# Patient Record
Sex: Female | Born: 1940
Health system: Southern US, Community
[De-identification: ages and names within clinical notes are randomized; demographics above are authoritative.]

## PROBLEM LIST (undated history)

## (undated) DIAGNOSIS — R55 Syncope and collapse: Secondary | ICD-10-CM

## (undated) DIAGNOSIS — K219 Gastro-esophageal reflux disease without esophagitis: Secondary | ICD-10-CM

## (undated) DIAGNOSIS — N189 Chronic kidney disease, unspecified: Secondary | ICD-10-CM

## (undated) DIAGNOSIS — M545 Low back pain, unspecified: Secondary | ICD-10-CM

## (undated) DIAGNOSIS — G8929 Other chronic pain: Secondary | ICD-10-CM

## (undated) DIAGNOSIS — E785 Hyperlipidemia, unspecified: Secondary | ICD-10-CM

## (undated) DIAGNOSIS — Z720 Tobacco use: Secondary | ICD-10-CM

## (undated) DIAGNOSIS — F0390 Unspecified dementia without behavioral disturbance: Secondary | ICD-10-CM

## (undated) DIAGNOSIS — J449 Chronic obstructive pulmonary disease, unspecified: Secondary | ICD-10-CM

## (undated) DIAGNOSIS — G44009 Cluster headache syndrome, unspecified, not intractable: Secondary | ICD-10-CM

## (undated) DIAGNOSIS — I739 Peripheral vascular disease, unspecified: Secondary | ICD-10-CM

## (undated) HISTORY — DX: Cluster headache syndrome, unspecified, not intractable: G44.009

## (undated) HISTORY — DX: Chronic obstructive pulmonary disease, unspecified: J44.9

## (undated) HISTORY — DX: Tobacco use: Z72.0

## (undated) HISTORY — DX: Other chronic pain: G89.29

## (undated) HISTORY — DX: Gastro-esophageal reflux disease without esophagitis: K21.9

## (undated) HISTORY — DX: Low back pain, unspecified: M54.50

## (undated) HISTORY — DX: Low back pain: M54.5

## (undated) HISTORY — DX: Peripheral vascular disease, unspecified: I73.9

## (undated) HISTORY — DX: Chronic kidney disease, unspecified: N18.9

## (undated) HISTORY — DX: Syncope and collapse: R55

## (undated) HISTORY — DX: Hyperlipidemia, unspecified: E78.5

---

## 1998-09-08 ENCOUNTER — Ambulatory Visit (HOSPITAL_COMMUNITY): Admission: RE | Admit: 1998-09-08 | Discharge: 1998-09-08 | Payer: Self-pay | Admitting: *Deleted

## 1998-09-14 ENCOUNTER — Ambulatory Visit (HOSPITAL_COMMUNITY): Admission: RE | Admit: 1998-09-14 | Discharge: 1998-09-14 | Payer: Self-pay | Admitting: *Deleted

## 1998-09-14 ENCOUNTER — Encounter: Payer: Self-pay | Admitting: *Deleted

## 2002-04-15 ENCOUNTER — Encounter: Admission: RE | Admit: 2002-04-15 | Discharge: 2002-04-15 | Payer: Self-pay | Admitting: Internal Medicine

## 2002-04-15 ENCOUNTER — Encounter: Payer: Self-pay | Admitting: Internal Medicine

## 2002-09-04 ENCOUNTER — Other Ambulatory Visit: Admission: RE | Admit: 2002-09-04 | Discharge: 2002-09-04 | Payer: Self-pay | Admitting: Internal Medicine

## 2006-07-30 ENCOUNTER — Other Ambulatory Visit: Admission: RE | Admit: 2006-07-30 | Discharge: 2006-07-30 | Payer: Self-pay | Admitting: Internal Medicine

## 2006-09-12 ENCOUNTER — Encounter (INDEPENDENT_AMBULATORY_CARE_PROVIDER_SITE_OTHER): Payer: Self-pay | Admitting: Specialist

## 2006-09-12 ENCOUNTER — Ambulatory Visit (HOSPITAL_COMMUNITY): Admission: RE | Admit: 2006-09-12 | Discharge: 2006-09-12 | Payer: Self-pay | Admitting: *Deleted

## 2009-02-25 ENCOUNTER — Other Ambulatory Visit: Admission: RE | Admit: 2009-02-25 | Discharge: 2009-02-25 | Payer: Self-pay | Admitting: Internal Medicine

## 2010-10-21 NOTE — Op Note (Signed)
NAMEMarland Kitchen  KATHRYN, COSBY NO.:  [id number]   MEDICAL RECORD NO.:  [id number]          PATIENT TYPE:  AMB   LOCATION:  ENDO                         FACILITY:  [facility]   PHYSICIAN:  Georgiana Spinner, M.D.    DATE OF BIRTH:  08-02-1940   DATE OF PROCEDURE:  09/12/2006  DATE OF DISCHARGE:                               OPERATIVE REPORT   PROCEDURE:  Colonoscopy.   ENDOSCOPIST:  Georgiana Spinner, M.D.   INDICATIONS:  Colon polyp, colon cancer screening.   ANESTHESIA:  Demerol 70 mg, Versed 7.5 mg.   PROCEDURE:  With the patient mildly sedated in the left lateral  decubitus position, the Pentax videoscopic colonoscope was inserted into  the rectum and passed under direct vision to the cecum, identified by  the ileocecal valve and appendiceal orifice, both which were  photographed.  From this point, the colonoscope was slowly withdrawn,  taking circumferential views of the colonic mucosa, stopping at  approximately 20 cm from the anal verge, at which point 2 polyps were  encountered; there were photographed and removed using hot-biopsy-  forceps technique at a setting of 20/200 blended current.  We then  withdrew all the way to the rectum, which appeared normal on direct and  showed 3 small polyps on retroflexed view, which were also removed using  hot-biopsy-forceps technique in the same setting, this done in  retroflexed view.  The endoscope was straightened and withdrawn.  The  patient's vital signs and pulse oximetry remained stable.  The patient  tolerated the procedure well without apparent complications.   FINDINGS:  Polyps of rectum and at 20 cm from anal verge.  Internal  hemorrhoids were also noted.  Otherwise, an unremarkable exam.   PLAN:  Await biopsy report.  The patient will call me for results and  follow up with me as an outpatient.           ______________________________  Georgiana Spinner, M.D.     GMO/MEDQ  D:  09/12/2006  T:  09/12/2006  Job:  [id number]

## 2014-11-12 DIAGNOSIS — Z1231 Encounter for screening mammogram for malignant neoplasm of breast: Secondary | ICD-10-CM | POA: Diagnosis not present

## 2014-11-19 DIAGNOSIS — F17210 Nicotine dependence, cigarettes, uncomplicated: Secondary | ICD-10-CM | POA: Diagnosis not present

## 2014-11-19 DIAGNOSIS — Z1389 Encounter for screening for other disorder: Secondary | ICD-10-CM | POA: Diagnosis not present

## 2014-11-19 DIAGNOSIS — M810 Age-related osteoporosis without current pathological fracture: Secondary | ICD-10-CM | POA: Diagnosis not present

## 2014-11-19 DIAGNOSIS — N390 Urinary tract infection, site not specified: Secondary | ICD-10-CM | POA: Diagnosis not present

## 2014-11-19 DIAGNOSIS — Z0000 Encounter for general adult medical examination without abnormal findings: Secondary | ICD-10-CM | POA: Diagnosis not present

## 2014-11-19 DIAGNOSIS — E785 Hyperlipidemia, unspecified: Secondary | ICD-10-CM | POA: Diagnosis not present

## 2014-11-26 DIAGNOSIS — F17210 Nicotine dependence, cigarettes, uncomplicated: Secondary | ICD-10-CM | POA: Diagnosis not present

## 2014-11-26 DIAGNOSIS — M545 Low back pain: Secondary | ICD-10-CM | POA: Diagnosis not present

## 2014-11-26 DIAGNOSIS — K219 Gastro-esophageal reflux disease without esophagitis: Secondary | ICD-10-CM | POA: Diagnosis not present

## 2014-11-26 DIAGNOSIS — G44009 Cluster headache syndrome, unspecified, not intractable: Secondary | ICD-10-CM | POA: Diagnosis not present

## 2014-11-26 DIAGNOSIS — E785 Hyperlipidemia, unspecified: Secondary | ICD-10-CM | POA: Diagnosis not present

## 2014-11-26 DIAGNOSIS — J449 Chronic obstructive pulmonary disease, unspecified: Secondary | ICD-10-CM | POA: Diagnosis not present

## 2014-11-26 DIAGNOSIS — N183 Chronic kidney disease, stage 3 (moderate): Secondary | ICD-10-CM | POA: Diagnosis not present

## 2014-11-26 DIAGNOSIS — M810 Age-related osteoporosis without current pathological fracture: Secondary | ICD-10-CM | POA: Diagnosis not present

## 2015-06-29 DIAGNOSIS — M810 Age-related osteoporosis without current pathological fracture: Secondary | ICD-10-CM | POA: Diagnosis not present

## 2015-06-29 DIAGNOSIS — E785 Hyperlipidemia, unspecified: Secondary | ICD-10-CM | POA: Diagnosis not present

## 2015-06-29 DIAGNOSIS — K219 Gastro-esophageal reflux disease without esophagitis: Secondary | ICD-10-CM | POA: Diagnosis not present

## 2015-06-29 DIAGNOSIS — E559 Vitamin D deficiency, unspecified: Secondary | ICD-10-CM | POA: Diagnosis not present

## 2015-07-06 DIAGNOSIS — F17210 Nicotine dependence, cigarettes, uncomplicated: Secondary | ICD-10-CM | POA: Diagnosis not present

## 2015-07-06 DIAGNOSIS — M810 Age-related osteoporosis without current pathological fracture: Secondary | ICD-10-CM | POA: Diagnosis not present

## 2015-07-06 DIAGNOSIS — E785 Hyperlipidemia, unspecified: Secondary | ICD-10-CM | POA: Diagnosis not present

## 2015-07-06 DIAGNOSIS — Z23 Encounter for immunization: Secondary | ICD-10-CM | POA: Diagnosis not present

## 2015-07-06 DIAGNOSIS — N183 Chronic kidney disease, stage 3 (moderate): Secondary | ICD-10-CM | POA: Diagnosis not present

## 2015-07-06 DIAGNOSIS — J449 Chronic obstructive pulmonary disease, unspecified: Secondary | ICD-10-CM | POA: Diagnosis not present

## 2015-11-17 DIAGNOSIS — H524 Presbyopia: Secondary | ICD-10-CM | POA: Diagnosis not present

## 2015-11-17 DIAGNOSIS — H5210 Myopia, unspecified eye: Secondary | ICD-10-CM | POA: Diagnosis not present

## 2015-12-21 DIAGNOSIS — E559 Vitamin D deficiency, unspecified: Secondary | ICD-10-CM | POA: Diagnosis not present

## 2015-12-21 DIAGNOSIS — Z1389 Encounter for screening for other disorder: Secondary | ICD-10-CM | POA: Diagnosis not present

## 2015-12-21 DIAGNOSIS — Z23 Encounter for immunization: Secondary | ICD-10-CM | POA: Diagnosis not present

## 2015-12-21 DIAGNOSIS — E785 Hyperlipidemia, unspecified: Secondary | ICD-10-CM | POA: Diagnosis not present

## 2015-12-21 DIAGNOSIS — M810 Age-related osteoporosis without current pathological fracture: Secondary | ICD-10-CM | POA: Diagnosis not present

## 2015-12-21 DIAGNOSIS — Z0000 Encounter for general adult medical examination without abnormal findings: Secondary | ICD-10-CM | POA: Diagnosis not present

## 2015-12-21 DIAGNOSIS — Z0001 Encounter for general adult medical examination with abnormal findings: Secondary | ICD-10-CM | POA: Diagnosis not present

## 2015-12-28 DIAGNOSIS — N183 Chronic kidney disease, stage 3 (moderate): Secondary | ICD-10-CM | POA: Diagnosis not present

## 2015-12-28 DIAGNOSIS — E785 Hyperlipidemia, unspecified: Secondary | ICD-10-CM | POA: Diagnosis not present

## 2015-12-28 DIAGNOSIS — J449 Chronic obstructive pulmonary disease, unspecified: Secondary | ICD-10-CM | POA: Diagnosis not present

## 2015-12-28 DIAGNOSIS — I499 Cardiac arrhythmia, unspecified: Secondary | ICD-10-CM | POA: Diagnosis not present

## 2015-12-28 DIAGNOSIS — K921 Melena: Secondary | ICD-10-CM | POA: Diagnosis not present

## 2015-12-28 DIAGNOSIS — M810 Age-related osteoporosis without current pathological fracture: Secondary | ICD-10-CM | POA: Diagnosis not present

## 2015-12-28 DIAGNOSIS — F17210 Nicotine dependence, cigarettes, uncomplicated: Secondary | ICD-10-CM | POA: Diagnosis not present

## 2016-01-12 DIAGNOSIS — K921 Melena: Secondary | ICD-10-CM | POA: Diagnosis not present

## 2016-01-12 DIAGNOSIS — E782 Mixed hyperlipidemia: Secondary | ICD-10-CM | POA: Diagnosis not present

## 2016-01-12 DIAGNOSIS — R5383 Other fatigue: Secondary | ICD-10-CM | POA: Diagnosis not present

## 2016-01-13 DIAGNOSIS — K921 Melena: Secondary | ICD-10-CM | POA: Diagnosis not present

## 2016-01-19 DIAGNOSIS — M810 Age-related osteoporosis without current pathological fracture: Secondary | ICD-10-CM | POA: Diagnosis not present

## 2016-01-21 DIAGNOSIS — Z1231 Encounter for screening mammogram for malignant neoplasm of breast: Secondary | ICD-10-CM | POA: Diagnosis not present

## 2016-02-03 DIAGNOSIS — K3189 Other diseases of stomach and duodenum: Secondary | ICD-10-CM | POA: Diagnosis not present

## 2016-02-03 DIAGNOSIS — D125 Benign neoplasm of sigmoid colon: Secondary | ICD-10-CM | POA: Diagnosis not present

## 2016-02-03 DIAGNOSIS — K319 Disease of stomach and duodenum, unspecified: Secondary | ICD-10-CM | POA: Diagnosis not present

## 2016-02-03 DIAGNOSIS — R195 Other fecal abnormalities: Secondary | ICD-10-CM | POA: Diagnosis not present

## 2016-02-03 DIAGNOSIS — K921 Melena: Secondary | ICD-10-CM | POA: Diagnosis not present

## 2016-02-03 DIAGNOSIS — D123 Benign neoplasm of transverse colon: Secondary | ICD-10-CM | POA: Diagnosis not present

## 2016-02-03 DIAGNOSIS — K5730 Diverticulosis of large intestine without perforation or abscess without bleeding: Secondary | ICD-10-CM | POA: Diagnosis not present

## 2016-02-03 DIAGNOSIS — D120 Benign neoplasm of cecum: Secondary | ICD-10-CM | POA: Diagnosis not present

## 2016-02-03 DIAGNOSIS — K2970 Gastritis, unspecified, without bleeding: Secondary | ICD-10-CM | POA: Diagnosis not present

## 2016-02-03 DIAGNOSIS — K635 Polyp of colon: Secondary | ICD-10-CM | POA: Diagnosis not present

## 2016-02-08 ENCOUNTER — Other Ambulatory Visit: Payer: Self-pay | Admitting: Internal Medicine

## 2016-02-08 DIAGNOSIS — Z9181 History of falling: Secondary | ICD-10-CM | POA: Diagnosis not present

## 2016-02-08 DIAGNOSIS — R55 Syncope and collapse: Secondary | ICD-10-CM

## 2016-02-08 DIAGNOSIS — N390 Urinary tract infection, site not specified: Secondary | ICD-10-CM | POA: Diagnosis not present

## 2016-02-08 DIAGNOSIS — S069X9A Unspecified intracranial injury with loss of consciousness of unspecified duration, initial encounter: Secondary | ICD-10-CM | POA: Diagnosis not present

## 2016-02-08 DIAGNOSIS — Z23 Encounter for immunization: Secondary | ICD-10-CM | POA: Diagnosis not present

## 2016-02-09 ENCOUNTER — Telehealth: Payer: Self-pay | Admitting: Cardiovascular Disease

## 2016-02-09 NOTE — Telephone Encounter (Signed)
Received records from [facility] for appointment with Dr Gwenlyn Found on 02/16/16.  Records given to [facility] (medical records) for Dr Kennon Holter schedule on 02/16/16. lp

## 2016-02-16 ENCOUNTER — Ambulatory Visit
Admission: RE | Admit: 2016-02-16 | Discharge: 2016-02-16 | Disposition: A | Discharge status: Home / Self Care | Payer: Commercial Managed Care - HMO | Source: Ambulatory Visit | Attending: Internal Medicine | Admitting: Internal Medicine

## 2016-02-16 ENCOUNTER — Ambulatory Visit: Payer: Commercial Managed Care - HMO | Admitting: Cardiovascular Disease

## 2016-02-16 DIAGNOSIS — R55 Syncope and collapse: Secondary | ICD-10-CM

## 2016-02-16 DIAGNOSIS — S069X9A Unspecified intracranial injury with loss of consciousness of unspecified duration, initial encounter: Secondary | ICD-10-CM

## 2016-02-22 DIAGNOSIS — S3992XA Unspecified injury of lower back, initial encounter: Secondary | ICD-10-CM | POA: Diagnosis not present

## 2016-02-22 DIAGNOSIS — M546 Pain in thoracic spine: Secondary | ICD-10-CM | POA: Diagnosis not present

## 2016-02-22 DIAGNOSIS — M545 Low back pain: Secondary | ICD-10-CM | POA: Diagnosis not present

## 2016-02-22 DIAGNOSIS — S199XXA Unspecified injury of neck, initial encounter: Secondary | ICD-10-CM | POA: Diagnosis not present

## 2016-02-22 DIAGNOSIS — Z9181 History of falling: Secondary | ICD-10-CM | POA: Diagnosis not present

## 2016-02-22 DIAGNOSIS — S299XXA Unspecified injury of thorax, initial encounter: Secondary | ICD-10-CM | POA: Diagnosis not present

## 2016-02-22 DIAGNOSIS — M898X1 Other specified disorders of bone, shoulder: Secondary | ICD-10-CM | POA: Diagnosis not present

## 2016-02-22 DIAGNOSIS — M542 Cervicalgia: Secondary | ICD-10-CM | POA: Diagnosis not present

## 2016-02-23 DIAGNOSIS — H2511 Age-related nuclear cataract, right eye: Secondary | ICD-10-CM | POA: Diagnosis not present

## 2016-02-23 DIAGNOSIS — H2513 Age-related nuclear cataract, bilateral: Secondary | ICD-10-CM | POA: Diagnosis not present

## 2016-03-15 ENCOUNTER — Encounter: Payer: Self-pay | Admitting: Cardiovascular Disease

## 2016-03-15 ENCOUNTER — Ambulatory Visit (INDEPENDENT_AMBULATORY_CARE_PROVIDER_SITE_OTHER): Payer: Commercial Managed Care - HMO | Admitting: Cardiovascular Disease

## 2016-03-15 VITALS — BP 106/68 | HR 63 | Ht 60.0 in | Wt 103.0 lb

## 2016-03-15 DIAGNOSIS — R55 Syncope and collapse: Secondary | ICD-10-CM | POA: Insufficient documentation

## 2016-03-15 DIAGNOSIS — E7800 Pure hypercholesterolemia, unspecified: Secondary | ICD-10-CM

## 2016-03-15 DIAGNOSIS — Z720 Tobacco use: Secondary | ICD-10-CM | POA: Diagnosis not present

## 2016-03-15 DIAGNOSIS — E785 Hyperlipidemia, unspecified: Secondary | ICD-10-CM | POA: Insufficient documentation

## 2016-03-15 NOTE — Patient Instructions (Signed)
Medication Instructions:  NO CHANGES.   Follow-Up: Your physician recommends that you schedule a follow-up appointment in: AS NEEDED.

## 2016-03-15 NOTE — Assessment & Plan Note (Signed)
Ms . Olivia Phillips had one episode of syncope on 02/05/16 a day after she had colonoscopy which involved bowel prep with lack of eating. It was presumed that this was related to dehydration. She was seen by paramedics and regained consciousness without loss of bowel or bladder bladder control. She had no prior episodes or subsequent episodes. I think this is easily explainable by dehydrated. I do not think she needs further workup at this time.

## 2016-03-15 NOTE — Assessment & Plan Note (Signed)
History of tobacco abuse smoking one pack a day for last 60 years recalcitrant risk factor modification

## 2016-03-15 NOTE — Progress Notes (Signed)
03/15/2016 Olivia Phillips   03/21/1941  [id number]  Primary Physician Thressa Sheller, MD Primary Cardiologist: Lorretta Harp MD Renae Gloss  HPI:  [name] is a 75 year old thin-appearing widowed Caucasian female mother 8, grandmother of 3 grandchildren who was referred by Dr. Noah Delaine for cardiovascular evaluation because of an episode of recent syncope. Resected for coronary artery disease include 60-pack-years of tobacco abuse currently smoking one pack per day and recalcitrant as factor modification. She does have treated hyperlipidemia. One of her siblings, a brother, at age 93 had a stent. She has never had a heart attack or stroke. She denies chest pain or shortness of breath. She had a colonoscopy on 02/04/16 and had an episode of syncope the following day presumably related to dehydration. She has never had an episode prior to that or subsequent to that.   Current Outpatient Prescriptions  Medication Sig Dispense Refill  . atorvastatin (LIPITOR) 40 MG tablet Take 40 mg by mouth daily.    . Calcium Carbonate-Vit D-Min (CALCIUM 1200 PO) Take 1 tablet by mouth daily.    . cholecalciferol (VITAMIN D) 1000 units tablet Take 1,000 Units by mouth daily.    . Multiple Vitamin (MULTIVITAMIN WITH MINERALS) TABS tablet Take 1 tablet by mouth daily.    . pravastatin (PRAVACHOL) 40 MG tablet Take 40 mg by mouth daily.     No current facility-administered medications for this visit.     Allergies  Allergen Reactions  . Codeine   . Alendronate Sodium Nausea And Vomiting  . Ibuprofen Other (See Comments)    Muscle spasms     Social History   Social History  . Marital status: Married    Spouse name: N/A  . Number of children: N/A  . Years of education: N/A   Occupational History  . Not on file.   Social History Main Topics  . Smoking status: Not on file  . Smokeless tobacco: Not on file  . Alcohol use Not on file  . Drug use: Unknown  . Sexual activity:  Not on file   Other Topics Concern  . Not on file   Social History Narrative  . No narrative on file     Review of Systems: General: negative for chills, fever, night sweats or weight changes.  Cardiovascular: negative for chest pain, dyspnea on exertion, edema, orthopnea, palpitations, paroxysmal nocturnal dyspnea or shortness of breath Dermatological: negative for rash Respiratory: negative for cough or wheezing Urologic: negative for hematuria Abdominal: negative for nausea, vomiting, diarrhea, bright red blood per rectum, melena, or hematemesis Neurologic: negative for visual changes, syncope, or dizziness All other systems reviewed and are otherwise negative except as noted above.    Blood pressure 106/68, pulse 63, height 5' (1.524 m), weight 103 lb (46.7 kg).  General appearance: alert and no distress Neck: no adenopathy, no carotid bruit, no JVD, supple, symmetrical, trachea midline and thyroid not enlarged, symmetric, no tenderness/mass/nodules Lungs: clear to auscultation bilaterally Heart: regular rate and rhythm, S1, S2 normal, no murmur, click, rub or gallop Extremities: extremities normal, atraumatic, no cyanosis or edema  EKG normal sinus rhythm at 63 without ST or T-wave changes. I personally reviewed this EKG  ASSESSMENT AND PLAN:   Hyperlipidemia History of hyperlipidemia on statin therapy followed by her PCP  Tobacco abuse History of tobacco abuse smoking one pack a day for last 60 years recalcitrant risk factor modification  Syncope [name] had one episode of syncope on 02/05/16 a  day after she had colonoscopy which involved bowel prep with lack of eating. It was presumed that this was related to dehydration. She was seen by paramedics and regained consciousness without loss of bowel or bladder bladder control. She had no prior episodes or subsequent episodes. I think this is easily explainable by dehydrated. I do not think she needs further workup at this  time.      Lorretta Harp MD FACP,FACC,FAHA, [facility] 03/15/2016 9:46 AM

## 2016-03-15 NOTE — Assessment & Plan Note (Signed)
History of hyperlipidemia on statin therapy followed by her PCP. 

## 2016-04-18 DIAGNOSIS — H2513 Age-related nuclear cataract, bilateral: Secondary | ICD-10-CM | POA: Diagnosis not present

## 2016-04-18 DIAGNOSIS — H2511 Age-related nuclear cataract, right eye: Secondary | ICD-10-CM | POA: Diagnosis not present

## 2016-04-25 DIAGNOSIS — H2512 Age-related nuclear cataract, left eye: Secondary | ICD-10-CM | POA: Diagnosis not present

## 2016-04-25 DIAGNOSIS — H2513 Age-related nuclear cataract, bilateral: Secondary | ICD-10-CM | POA: Diagnosis not present

## 2016-06-27 DIAGNOSIS — E559 Vitamin D deficiency, unspecified: Secondary | ICD-10-CM | POA: Diagnosis not present

## 2016-06-27 DIAGNOSIS — M810 Age-related osteoporosis without current pathological fracture: Secondary | ICD-10-CM | POA: Diagnosis not present

## 2016-06-27 DIAGNOSIS — E785 Hyperlipidemia, unspecified: Secondary | ICD-10-CM | POA: Diagnosis not present

## 2016-06-29 DIAGNOSIS — N183 Chronic kidney disease, stage 3 (moderate): Secondary | ICD-10-CM | POA: Diagnosis not present

## 2016-06-29 DIAGNOSIS — J449 Chronic obstructive pulmonary disease, unspecified: Secondary | ICD-10-CM | POA: Diagnosis not present

## 2016-06-29 DIAGNOSIS — E785 Hyperlipidemia, unspecified: Secondary | ICD-10-CM | POA: Diagnosis not present

## 2016-06-29 DIAGNOSIS — M810 Age-related osteoporosis without current pathological fracture: Secondary | ICD-10-CM | POA: Diagnosis not present

## 2016-09-11 DIAGNOSIS — R413 Other amnesia: Secondary | ICD-10-CM | POA: Diagnosis not present

## 2016-09-28 ENCOUNTER — Telehealth: Payer: Self-pay

## 2016-09-28 ENCOUNTER — Ambulatory Visit: Payer: Medicare HMO | Admitting: Neurology

## 2016-09-28 NOTE — Telephone Encounter (Signed)
Patient did not show to NP appt.

## 2016-10-03 ENCOUNTER — Encounter: Payer: Self-pay | Admitting: Neurology

## 2016-11-13 DIAGNOSIS — Z961 Presence of intraocular lens: Secondary | ICD-10-CM | POA: Diagnosis not present

## 2016-11-13 DIAGNOSIS — H26491 Other secondary cataract, right eye: Secondary | ICD-10-CM | POA: Diagnosis not present

## 2016-11-13 DIAGNOSIS — H26493 Other secondary cataract, bilateral: Secondary | ICD-10-CM | POA: Diagnosis not present

## 2016-11-20 DIAGNOSIS — H524 Presbyopia: Secondary | ICD-10-CM | POA: Diagnosis not present

## 2016-11-20 DIAGNOSIS — H26491 Other secondary cataract, right eye: Secondary | ICD-10-CM | POA: Diagnosis not present

## 2016-11-23 DIAGNOSIS — H524 Presbyopia: Secondary | ICD-10-CM | POA: Diagnosis not present

## 2016-12-13 DIAGNOSIS — Z961 Presence of intraocular lens: Secondary | ICD-10-CM | POA: Diagnosis not present

## 2016-12-13 DIAGNOSIS — H26492 Other secondary cataract, left eye: Secondary | ICD-10-CM | POA: Diagnosis not present

## 2016-12-18 DIAGNOSIS — H26492 Other secondary cataract, left eye: Secondary | ICD-10-CM | POA: Diagnosis not present

## 2016-12-20 DIAGNOSIS — N390 Urinary tract infection, site not specified: Secondary | ICD-10-CM | POA: Diagnosis not present

## 2016-12-20 DIAGNOSIS — N183 Chronic kidney disease, stage 3 (moderate): Secondary | ICD-10-CM | POA: Diagnosis not present

## 2016-12-20 DIAGNOSIS — Z0000 Encounter for general adult medical examination without abnormal findings: Secondary | ICD-10-CM | POA: Diagnosis not present

## 2016-12-20 DIAGNOSIS — E785 Hyperlipidemia, unspecified: Secondary | ICD-10-CM | POA: Diagnosis not present

## 2016-12-20 DIAGNOSIS — M810 Age-related osteoporosis without current pathological fracture: Secondary | ICD-10-CM | POA: Diagnosis not present

## 2017-01-10 DIAGNOSIS — E785 Hyperlipidemia, unspecified: Secondary | ICD-10-CM | POA: Diagnosis not present

## 2017-01-10 DIAGNOSIS — N183 Chronic kidney disease, stage 3 (moderate): Secondary | ICD-10-CM | POA: Diagnosis not present

## 2017-01-10 DIAGNOSIS — J449 Chronic obstructive pulmonary disease, unspecified: Secondary | ICD-10-CM | POA: Diagnosis not present

## 2017-01-10 DIAGNOSIS — Z0000 Encounter for general adult medical examination without abnormal findings: Secondary | ICD-10-CM | POA: Diagnosis not present

## 2017-01-22 DIAGNOSIS — M810 Age-related osteoporosis without current pathological fracture: Secondary | ICD-10-CM | POA: Diagnosis not present

## 2017-02-07 DIAGNOSIS — M549 Dorsalgia, unspecified: Secondary | ICD-10-CM | POA: Diagnosis not present

## 2017-02-07 DIAGNOSIS — G44019 Episodic cluster headache, not intractable: Secondary | ICD-10-CM | POA: Diagnosis not present

## 2017-02-07 DIAGNOSIS — M546 Pain in thoracic spine: Secondary | ICD-10-CM | POA: Diagnosis not present

## 2017-02-07 DIAGNOSIS — M545 Low back pain: Secondary | ICD-10-CM | POA: Diagnosis not present

## 2017-02-07 DIAGNOSIS — S3992XA Unspecified injury of lower back, initial encounter: Secondary | ICD-10-CM | POA: Diagnosis not present

## 2017-05-26 DIAGNOSIS — J441 Chronic obstructive pulmonary disease with (acute) exacerbation: Secondary | ICD-10-CM | POA: Diagnosis not present

## 2017-05-26 DIAGNOSIS — R062 Wheezing: Secondary | ICD-10-CM | POA: Diagnosis not present

## 2017-05-26 DIAGNOSIS — R0602 Shortness of breath: Secondary | ICD-10-CM | POA: Diagnosis not present

## 2017-07-05 DIAGNOSIS — E785 Hyperlipidemia, unspecified: Secondary | ICD-10-CM | POA: Diagnosis not present

## 2017-07-05 DIAGNOSIS — N183 Chronic kidney disease, stage 3 (moderate): Secondary | ICD-10-CM | POA: Diagnosis not present

## 2017-07-05 DIAGNOSIS — N390 Urinary tract infection, site not specified: Secondary | ICD-10-CM | POA: Diagnosis not present

## 2017-07-05 DIAGNOSIS — M810 Age-related osteoporosis without current pathological fracture: Secondary | ICD-10-CM | POA: Diagnosis not present

## 2017-07-12 DIAGNOSIS — E7800 Pure hypercholesterolemia, unspecified: Secondary | ICD-10-CM | POA: Diagnosis not present

## 2017-07-12 DIAGNOSIS — E559 Vitamin D deficiency, unspecified: Secondary | ICD-10-CM | POA: Diagnosis not present

## 2017-08-28 DIAGNOSIS — H353132 Nonexudative age-related macular degeneration, bilateral, intermediate dry stage: Secondary | ICD-10-CM | POA: Diagnosis not present

## 2018-01-09 DIAGNOSIS — Z5181 Encounter for therapeutic drug level monitoring: Secondary | ICD-10-CM | POA: Diagnosis not present

## 2018-01-09 DIAGNOSIS — E785 Hyperlipidemia, unspecified: Secondary | ICD-10-CM | POA: Diagnosis not present

## 2018-01-09 DIAGNOSIS — E559 Vitamin D deficiency, unspecified: Secondary | ICD-10-CM | POA: Diagnosis not present

## 2018-01-09 DIAGNOSIS — R946 Abnormal results of thyroid function studies: Secondary | ICD-10-CM | POA: Diagnosis not present

## 2018-01-09 DIAGNOSIS — Z0000 Encounter for general adult medical examination without abnormal findings: Secondary | ICD-10-CM | POA: Diagnosis not present

## 2018-01-14 DIAGNOSIS — Z0000 Encounter for general adult medical examination without abnormal findings: Secondary | ICD-10-CM | POA: Diagnosis not present

## 2018-01-14 DIAGNOSIS — R636 Underweight: Secondary | ICD-10-CM | POA: Diagnosis not present

## 2018-01-14 DIAGNOSIS — E559 Vitamin D deficiency, unspecified: Secondary | ICD-10-CM | POA: Diagnosis not present

## 2018-01-14 DIAGNOSIS — E785 Hyperlipidemia, unspecified: Secondary | ICD-10-CM | POA: Diagnosis not present

## 2018-01-14 DIAGNOSIS — R413 Other amnesia: Secondary | ICD-10-CM | POA: Diagnosis not present

## 2018-01-14 DIAGNOSIS — Z01419 Encounter for gynecological examination (general) (routine) without abnormal findings: Secondary | ICD-10-CM | POA: Diagnosis not present

## 2018-01-14 DIAGNOSIS — Z1212 Encounter for screening for malignant neoplasm of rectum: Secondary | ICD-10-CM | POA: Diagnosis not present

## 2018-01-14 DIAGNOSIS — F17210 Nicotine dependence, cigarettes, uncomplicated: Secondary | ICD-10-CM | POA: Diagnosis not present

## 2018-01-17 ENCOUNTER — Encounter: Payer: Self-pay | Admitting: Neurology

## 2018-01-17 ENCOUNTER — Telehealth: Payer: Self-pay

## 2018-01-17 ENCOUNTER — Other Ambulatory Visit: Payer: Self-pay | Admitting: Internal Medicine

## 2018-01-17 ENCOUNTER — Ambulatory Visit: Payer: Medicare HMO | Admitting: Neurology

## 2018-01-17 DIAGNOSIS — F17210 Nicotine dependence, cigarettes, uncomplicated: Secondary | ICD-10-CM

## 2018-01-17 NOTE — Telephone Encounter (Signed)
Pt also did not show for a new patient appt on 09/28/2016.  Does this pt qualify for dismissal per [facility] no show policy?

## 2018-01-17 NOTE — Telephone Encounter (Signed)
Pt did not show for their appt with Dr. Athar today.  

## 2018-01-18 NOTE — Telephone Encounter (Signed)
Please review and follow protocol.

## 2018-01-22 NOTE — Telephone Encounter (Signed)
I spoke to Olivia Phillips. Since the pt has only no showed once this year, she cannot be dismissed.

## 2018-02-18 ENCOUNTER — Encounter: Payer: Self-pay | Admitting: Neurology

## 2018-03-26 ENCOUNTER — Ambulatory Visit: Payer: Medicare HMO | Admitting: Neurology

## 2018-05-15 ENCOUNTER — Encounter: Payer: Self-pay | Admitting: Neurology

## 2018-05-15 ENCOUNTER — Ambulatory Visit: Payer: Medicare HMO | Admitting: Neurology

## 2018-05-15 VITALS — BP 135/74 | HR 82 | Ht 60.5 in | Wt 95.0 lb

## 2018-05-15 DIAGNOSIS — R636 Underweight: Secondary | ICD-10-CM | POA: Diagnosis not present

## 2018-05-15 DIAGNOSIS — F17200 Nicotine dependence, unspecified, uncomplicated: Secondary | ICD-10-CM

## 2018-05-15 DIAGNOSIS — F0390 Unspecified dementia without behavioral disturbance: Secondary | ICD-10-CM

## 2018-05-15 DIAGNOSIS — R413 Other amnesia: Secondary | ICD-10-CM | POA: Diagnosis not present

## 2018-05-15 DIAGNOSIS — F03C0 Unspecified dementia, severe, without behavioral disturbance, psychotic disturbance, mood disturbance, and anxiety: Secondary | ICD-10-CM

## 2018-05-15 DIAGNOSIS — Z818 Family history of other mental and behavioral disorders: Secondary | ICD-10-CM

## 2018-05-15 NOTE — Patient Instructions (Addendum)
You appear to have advanced memory loss.   You don't eat enough. One meal a day is not enough. You have to have proper nutrition and good fluid intake. Please drink water, about 6 cups over the whole day. Please use Boost or Ensure or Carnation instant breakfast.   Please quit smoking, it is very important.   I would suggest with proceed with a brain scan, called MRI and call you with the test results. We will have to schedule you for this on a separate date. This test requires authorization from your insurance, and we will take care of the insurance process.  We will request a formal neuropsychological test (aka cognitive testing) for your memory complaints. This requires a referral to a trained and licensed neuropsychologist and will be a separate appointment at a different clinic.   As discussed, please don't cook at the stove. You need 24/7 supervision. I agree with you not driving any longer.

## 2018-05-15 NOTE — Progress Notes (Signed)
Subjective:    Patient ID: Olivia Phillips is a 77 y.o. female.  HPI     Star Age, MD, PhD [facility] [address], [address]. [place],  [address]  Dear Dr. Shelia Media,   I saw your patient, Olivia Phillips, upon your kind request in my neurologic clinic today for initial consultation of her memory loss the patient is accompanied by her sister today. Of note, she no showed for appointment on 09/28/2016, as well as 01/17/2018. As you know, Olivia Phillips is a 77 year old right-handed woman with an underlying medical history of syncopy, hyperlipidemia, COPD, reflux disease, history of headaches, smoking, chronic low back pain, underweight state, and vitamin D deficiency, who reports memory loss. She is not able to further elaborate. Her sister reports that she has had memory loss for 2 years, perhaps longer, likely longer than that. 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 She has a fifth grade education. She has 1 son who lives with her, age 23 and his son, her grandson lives there as well, age 67. She is by herself some during the day. Sister lives across the street. She has a total of 3 sisters and 4 brothers. The oldest sister who is in her early 77s has memory loss and patient's mother had memory loss, died at age 59. I reviewed your office note from 01/14/2018, which you kindly included. She saw Dr. Alvester Chou in cardiology after she had the syncopal spell which was deemed secondary to dehydration. She had a head CT without contrast on 02/16/2016 which showed: IMPRESSION: No acute intracranial findings.   Chronic ischemic microvascular disease.   Minimal opacification of the left mastoid air cells. She sleeps fairly well but has a variable sleep schedule. She has been smoking since age 56 and has not considered stopping, she smokes about half a pack per day, does not use any alcohol, rarely drinks any water, drinks about 2-3 cups of tea per day which  she makes herself. She cooks sometimes at her stove. She no longer drives, has not been driving for the past 2 years as she started getting lost. Her sister takes her to the grocery store. Patient admits that she has lost appetite and that she forgets to eat sometimes, she typically eats one meal per day. She has been told to supplement but does not like and sure or boost and was told to supplement with [employer] and indicates that she drinks 1 or 2 per day at sister also reports that she buys it for her or with her and typically they by 24 in a month, which would average less than one per day.   Her Past Medical History Is Significant For: Past Medical History:  Diagnosis Date  . Chronic low back pain   . Cluster headache   . COPD (chronic obstructive pulmonary disease) ([place])   . CRF (chronic renal failure)   . GERD (gastroesophageal reflux disease)   . Hyperlipidemia   . Syncopal episodes   . Tobacco use     Her Past Surgical History Is Significant For: No past surgical history on file.  Her Family History Is Significant For: Family History  Problem Relation Age of Onset  . Alzheimer's disease Mother   . Lung cancer Son     Her Social History Is Significant For: Social History   Socioeconomic History  . Marital status: Married    Spouse name: Not on file  . Number of  children: Not on file  . Years of education: Not on file  . Highest education level: Not on file  Occupational History  . Not on file  Social Needs  . Financial resource strain: Not on file  . Food insecurity:    Worry: Not on file    Inability: Not on file  . Transportation needs:    Medical: Not on file    Non-medical: Not on file  Tobacco Use  . Smoking status: Current Every Day Smoker  . Smokeless tobacco: Never Used  Substance and Sexual Activity  . Alcohol use: No  . Drug use: No  . Sexual activity: Not on file  Lifestyle  . Physical activity:    Days per week: Not on file     Minutes per session: Not on file  . Stress: Not on file  Relationships  . Social connections:    Talks on phone: Not on file    Gets together: Not on file    Attends religious service: Not on file    Active member of club or organization: Not on file    Attends meetings of clubs or organizations: Not on file    Relationship status: Not on file  Other Topics Concern  . Not on file  Social History Narrative  . Not on file    Her Allergies Are:  Allergies  Allergen Reactions  . Codeine   . Alendronate Sodium Nausea And Vomiting  . Ibuprofen Other (See Comments)    Muscle spasms   :   Her Current Medications Are:  Outpatient Encounter Medications as of 05/15/2018  Medication Sig  . Calcium Carbonate-Vit D-Min (CALCIUM 1200 PO) Take 1 tablet by mouth daily.  . cholecalciferol (VITAMIN D) 1000 units tablet Take 1,000 Units by mouth daily.  . Multiple Vitamin (MULTIVITAMIN WITH MINERALS) TABS tablet Take 1 tablet by mouth daily.  . pravastatin (PRAVACHOL) 40 MG tablet Take 40 mg by mouth daily.  . [DISCONTINUED] cyclobenzaprine (FLEXERIL) 10 MG tablet Take 10 mg by mouth 3 (three) times daily as needed for muscle spasms.  . [DISCONTINUED] dronabinol (MARINOL) 5 MG capsule Take 5 mg by mouth 2 (two) times daily before lunch and supper.   No facility-administered encounter medications on file as of 05/15/2018.   :   Review of Systems:  Out of a complete 14 point review of systems, all are reviewed and negative with the exception of these symptoms as listed below:  Review of Systems  Neurological:       Pt presents today to discuss her memory loss.    Objective:  Neurological Exam  Physical Exam Physical Examination:   Vitals:   05/15/18 1410  BP: 135/74  Pulse: 82   General Examination: The patient is a very pleasant 77 y.o. female in no acute distress. She appears well-developed and well-nourished and adequately groomed.  She appears thin and  undernourished.  HEENT: Normocephalic, atraumatic, pupils are equal, round and reactive to light and accommodation.  Extraocular tracking isFairly well-preserved. She has no nystagmus. Hearing is grossly intact. Face is symmetric. Speech is clear, no dysarthria. Airway examination reveals moderate mouth dryness, otherwise no significant abnormalities, dentures in place, tongue protrudes centrally and palate elevates symmetrically.  Chest: Clear to auscultation without wheezing, rhonchi or crackles noted.  Heart: S1+S2+0, regular and normal without murmurs, rubs or gallops noted.   Abdomen: Soft, non-tender and non-distended with normal bowel sounds appreciated on auscultation.  Extremities: There is no pitting edema in the distal  lower extremities bilaterally.   Skin: Warm and dry without trophic changes noted.  Musculoskeletal: exam reveals no obvious joint deformities, tenderness or joint swelling or erythema.   Neurologically:  Mental status: The patient is awake, alert and oriented in all 4 spheres. Her immediate and remote memory, attention, language skills and fund of knowledge are significantly impaired. She is not able to give a very detailed history. She looks to her sister for additional information. Her sister provides most of her history. Mood appears normal, affect somewhat blunted.  On 05/15/2018: MMSE: 10/30, CDT: 0/4, AFT: 4/min.   Cranial nerves II - XII are as described above under HEENT exam. In addition: shoulder shrug is normal with equal shoulder height noted. Motor exam: thin bulk, global strength of 4 out of 5, no one-sided weakness, no tremor. Romberg is not tested for safety reasons. Fine motor skills are globally preserved for age. Normal bulk, strength and tone is noted. There is no drift, tremor or rebound. Romberg is negative. Cerebellar testing: No dysmetria or intention tremor.  Sensory exam: intact to light touch in the upper and lower extremities.  Gait,  station and balance: She stands Without difficulty and walks without a walking aid or assistance.  Assessment and Plan:  Assessment and Plan:  In summary, Olivia Phillips is a very pleasant 77 y.o.-year old female with an underlying medical history of syncopy, hyperlipidemia, COPD, reflux disease, history of headaches, smoking, chronic low back pain, underweight state, and vitamin D deficiency, who presents for evaluation of her memory loss of at least 2 years duration, likely longer. She has vascular risk factors especially ongoing smoking many decades. She also reports a family history of memory loss. Her memory scores today indicate advanced memory loss. I would like to proceed with further testing in the form of brain MRI without contrast and neuropsychological evaluation for more in-depth cognitive testing. I had a long chat with the patient and her sister about my findings and the diagnosis of memory loss and dementia, its prognosis and treatment options. We talked about medical treatments and non-pharmacological approaches. We talked about The importance of smoking cessation and maintaining a healthy lifestyle in general, she is strongly encouraged to keep a better schedule with her meals, she sometimes does not eat with them once a day. She does not appear to have supplemented his nutrition appropriately with nutritional shakes either. She does not drink water enough and likely is potentially on the suboptimally hydrated or dehydrated side. I encouraged the patient to eat healthy, exercise daily and keep well hydrated, to keep a scheduled bedtime and wake time routine, to not skip any meals and eat healthy snacks in between meals and to have protein with every meal. I stressed the importance of regular exercise. I advised her to no longer drive. Her furthermore advised her to no longer cook at the stove. She will likely need 24-7 supervision at the house.  I will see her back after  testing/neuropsychological consultation is completed. I answered all their questions today and the patient and her sister were in agreement.   Thank you very much for allowing me to participate in the care of this nice patient. If I can be of any further assistance to you please do not hesitate to call me at [phone].  Sincerely,   Star Age, MD, PhD

## 2018-05-16 ENCOUNTER — Telehealth: Payer: Self-pay | Admitting: Neurology

## 2018-05-16 NOTE — Telephone Encounter (Signed)
Mcarthur Rossetti Josem Kaufmann: [id number] (exp. 05/16/18 to 06/15/18) order sent to GI. They will reach out to the pt to schedule.

## 2018-05-16 NOTE — Telephone Encounter (Signed)
Patient is aware 

## 2018-05-20 ENCOUNTER — Other Ambulatory Visit: Payer: Self-pay | Admitting: Internal Medicine

## 2018-05-31 ENCOUNTER — Ambulatory Visit
Admission: RE | Admit: 2018-05-31 | Discharge: 2018-05-31 | Disposition: A | Discharge status: Home / Self Care | Payer: Self-pay | Source: Ambulatory Visit | Attending: Neurology | Admitting: Neurology

## 2018-05-31 DIAGNOSIS — Z818 Family history of other mental and behavioral disorders: Secondary | ICD-10-CM

## 2018-05-31 DIAGNOSIS — F03C0 Unspecified dementia, severe, without behavioral disturbance, psychotic disturbance, mood disturbance, and anxiety: Secondary | ICD-10-CM

## 2018-05-31 DIAGNOSIS — R413 Other amnesia: Secondary | ICD-10-CM

## 2018-05-31 DIAGNOSIS — F0390 Unspecified dementia without behavioral disturbance: Secondary | ICD-10-CM | POA: Diagnosis not present

## 2018-05-31 DIAGNOSIS — F17200 Nicotine dependence, unspecified, uncomplicated: Secondary | ICD-10-CM

## 2018-06-03 ENCOUNTER — Telehealth: Payer: Self-pay

## 2018-06-03 NOTE — Telephone Encounter (Signed)
I called pt's sister, Danton Clap, per DPR. I advised her of pt's MRI results. I reminded pt's sister of pt's appt with Dr. Rexene Alberts in April. Pt's sister verbalized understanding of results. Pt' sister had no questions at this time but was encouraged to call back if questions arise.

## 2018-06-03 NOTE — Progress Notes (Signed)
Please call patient regarding the recent brain MRI: The brain scan showed an overall normal structure of the brain, but moderate volume loss which we call atrophy. There were changes in the deeper structures of the brain, which we call white matter changes or microvascular changes. These were reported as moderate in Her case. These are tiny white spots, that occur with time and are seen in a variety of conditions, including with normal aging, chronic hypertension, chronic headaches, especially migraine HAs, chronic diabetes, chronic hyperlipidemia. These are not strokes and no mass or lesion were seen on this scan without contrast given. Again, there were no acute findings, such as a stroke, or mass or blood products. Compared to a CT head from 02/2016, which is of course a different type of scan, no significant changes seen.  No further action is required on this test at this time, other than re-enforcing the importance of good blood pressure control, good cholesterol control, good blood sugar control, and weight management. Please remind patient to keep any upcoming appointments or tests and to call us with any interim questions, concerns, problems or updates. Thanks,  Star Age, MD, PhD

## 2018-06-03 NOTE — Telephone Encounter (Signed)
-----   Message from Star Age, MD sent at 06/03/2018  7:59 AM EST ----- Please call patient regarding the recent brain MRI: The brain scan showed an overall normal structure of the brain, but moderate volume loss which we call atrophy. There were changes in the deeper structures of the brain, which we call white matter changes or microvascular changes. These were reported as moderate in Her case. These are tiny white spots, that occur with time and are seen in a variety of conditions, including with normal aging, chronic hypertension, chronic headaches, especially migraine HAs, chronic diabetes, chronic hyperlipidemia. These are not strokes and no mass or lesion were seen on this scan without contrast given. Again, there were no acute findings, such as a stroke, or mass or blood products. Compared to a CT head from 02/2016, which is of course a different type of scan, no significant changes seen.  No further action is required on this test at this time, other than re-enforcing the importance of good blood pressure control, good cholesterol control, good blood sugar control, and weight management. Please remind patient to keep any upcoming appointments or tests and to call us with any interim questions, concerns, problems or updates. Thanks,  Star Age, MD, PhD

## 2018-08-05 ENCOUNTER — Encounter: Payer: Self-pay | Admitting: Neurology

## 2018-08-21 DIAGNOSIS — E559 Vitamin D deficiency, unspecified: Secondary | ICD-10-CM | POA: Diagnosis not present

## 2018-08-21 DIAGNOSIS — M810 Age-related osteoporosis without current pathological fracture: Secondary | ICD-10-CM | POA: Diagnosis not present

## 2018-08-21 DIAGNOSIS — E785 Hyperlipidemia, unspecified: Secondary | ICD-10-CM | POA: Diagnosis not present

## 2018-09-16 ENCOUNTER — Ambulatory Visit: Payer: Medicare HMO | Admitting: Neurology

## 2018-09-26 ENCOUNTER — Telehealth: Payer: Self-pay

## 2018-09-26 NOTE — Telephone Encounter (Signed)
Spoke with the patient's son [name] and he stated that he would have his mother return our call when she comes home. Office number was provided.

## 2018-10-01 ENCOUNTER — Other Ambulatory Visit: Payer: Self-pay

## 2018-10-01 ENCOUNTER — Ambulatory Visit: Payer: Medicare HMO | Admitting: Neurology

## 2018-10-31 ENCOUNTER — Telehealth: Payer: Self-pay | Admitting: *Deleted

## 2018-10-31 NOTE — Telephone Encounter (Signed)
Unable to get in contact with the patient to convert their office visit with Judson Roch on 11/05/2018 into a doxy.me visit. I left a voicemail asking the patients son, [name] to return my call. Office number was provided.     If patient calls back please convert their office visit into a doxy.me visit.

## 2018-11-05 ENCOUNTER — Encounter: Payer: Self-pay | Admitting: Neurology

## 2018-11-05 ENCOUNTER — Other Ambulatory Visit: Payer: Self-pay

## 2018-11-05 ENCOUNTER — Ambulatory Visit (INDEPENDENT_AMBULATORY_CARE_PROVIDER_SITE_OTHER): Payer: Medicare HMO | Admitting: Neurology

## 2018-11-05 VITALS — BP 106/59 | HR 74 | Temp 97.8°F | Ht 60.0 in | Wt 93.0 lb

## 2018-11-05 DIAGNOSIS — R413 Other amnesia: Secondary | ICD-10-CM

## 2018-11-05 NOTE — Telephone Encounter (Signed)
I called and Olivia Phillips, son answered.  Pt not available to speak.  Did not have video capability.  Did not think she would be able to talk on phone.  He spoke to someone yesterday and was told to come in to office. He was told process. I  Explained again to wear mask, they will take temperature, ask covid 19 screening questions.  He verbalized understanding.

## 2018-11-05 NOTE — Progress Notes (Addendum)
PATIENT: Olivia Phillips DOB: 1941-03-25  REASON FOR VISIT: follow up HISTORY FROM: patient  HISTORY OF PRESENT ILLNESS: Today 11/05/18  HISTORY I saw your patient, Shareena Nusz, upon your kind request in my neurologic clinic today for initial consultation of her memory loss the patient is accompanied by her sister today. Of note, she no showed for appointment on 09/28/2016, as well as 01/17/2018. As you know, Ms. Atha is a 78 year old right-handed woman with an underlying medical history of syncopy, hyperlipidemia, COPD, reflux disease, history of headaches, smoking, chronic low back pain, underweight state, and vitamin D deficiency, who reports memory loss. She is not able to further elaborate. Her sister reports that she has had memory loss for 2 years, perhaps longer, likely longer than that. 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 She has a fifth grade education. She has 1 son who lives with her, age 19 and his son, her grandson lives there as well, age 86. She is by herself some during the day. Sister lives across the street. She has a total of 3 sisters and 4 brothers. The oldest sister who is in her early 79s has memory loss and patient's mother had memory loss, died at age 79. I reviewed your office note from 01/14/2018, which you kindly included. She saw Dr. Alvester Chou in cardiology after she had the syncopal spell which was deemed secondary to dehydration. She had a head CT without contrast on 02/16/2016 which showed: IMPRESSION: No acute intracranial findings.  Chronic ischemic microvascular disease.  Minimal opacification of the left mastoid air cells. She sleeps fairly well but has a variable sleep schedule. She has been smoking since age 31 and has not considered stopping, she smokes about half a pack per day, does not use any alcohol, rarely drinks any water, drinks about 2-3 cups of tea per day which she makes herself. She cooks sometimes at her stove. She no  longer drives, has not been driving for the past 2 years as she started getting lost. Her sister takes her to the grocery store. Patient admits that she has lost appetite and that she forgets to eat sometimes, she typically eats one meal per day. She has been told to supplement but does not like and sure or boost and was told to supplement with [employer] and indicates that she drinks 1 or 2 per day at sister also reports that she buys it for her or with her and typically they by 24 in a month, which would average less than one per day.  11/05/2018 SS: Ms. Cuffie is a 78- year old female with history of memory loss for at least 2 years in duration.  She has history of syncope, hyperlipidemia, COPD, reflux disease, headaches, smoking, chronic low back pain, underweight state, and vitamin D deficiency.  Her last MMSE in December 2019 was 10/30. Dr. Rexene Alberts had ordered neuropsychological testing and MRI of the brain.  She had MRI of the brain in December 2019 that showed moderate generalized atrophy, slightly progressed compared to the CT scan in 2017, white matter changes consistent with moderately severe chronic microvascular ischemic change, no acute findings. Dr. Rexene Alberts recommended good blood pressure control, good cholesterol control, good blood sugar control, and weight management.  She lives with her son, she is at today's appointment with her sister, Danton Clap.  She reports she does her own ADLs, she does some cooking, if she has supervision. She says she does not have much of  an appetite, she has gained a few pounds, she now weighs 93 lbs. she does not drive a car.  She reports that he likes to watch TV.  Her overall health is very good.  She continues to smoke, anywhere from 1/2 to 1 pack a day.  She manages her own medications, her son manages her finances.  She says she has a good relationship with her son.  There have not been any issues with wandering or behavioral problems.  Her sister says  that she thinks the people are stealing from her.  She follows with her primary care doctor twice yearly.  She does not like to drink much water, prefers sweet tea.  She denies any new problems or concerns.  REVIEW OF SYSTEMS: Out of a complete 14 system review of symptoms, the patient complains only of the following symptoms, and all other reviewed systems are negative.  Memory loss  ALLERGIES: Allergies  Allergen Reactions  . Codeine   . Alendronate Sodium Nausea And Vomiting  . Ibuprofen Other (See Comments)    Muscle spasms     HOME MEDICATIONS: Outpatient Medications Prior to Visit  Medication Sig Dispense Refill  . Calcium Carbonate-Vit D-Min (CALCIUM 1200 PO) Take 1 tablet by mouth daily.    . cholecalciferol (VITAMIN D) 1000 units tablet Take 1,000 Units by mouth daily.    . Multiple Vitamin (MULTIVITAMIN WITH MINERALS) TABS tablet Take 1 tablet by mouth daily.    . pravastatin (PRAVACHOL) 40 MG tablet Take 40 mg by mouth daily.     No facility-administered medications prior to visit.     PAST MEDICAL HISTORY: Past Medical History:  Diagnosis Date  . Chronic low back pain   . Cluster headache   . COPD (chronic obstructive pulmonary disease) ([place])   . CRF (chronic renal failure)   . GERD (gastroesophageal reflux disease)   . Hyperlipidemia   . Syncopal episodes   . Tobacco use     PAST SURGICAL HISTORY: No past surgical history on file.  FAMILY HISTORY: Family History  Problem Relation Age of Onset  . Alzheimer's disease Mother   . Lung cancer Son     SOCIAL HISTORY: Social History   Socioeconomic History  . Marital status: Married    Spouse name: Not on file  . Number of children: Not on file  . Years of education: Not on file  . Highest education level: Not on file  Occupational History  . Not on file  Social Needs  . Financial resource strain: Not on file  . Food insecurity:    Worry: Not on file    Inability: Not on file  . Transportation  needs:    Medical: Not on file    Non-medical: Not on file  Tobacco Use  . Smoking status: Current Every Day Smoker  . Smokeless tobacco: Never Used  Substance and Sexual Activity  . Alcohol use: No  . Drug use: No  . Sexual activity: Not on file  Lifestyle  . Physical activity:    Days per week: Not on file    Minutes per session: Not on file  . Stress: Not on file  Relationships  . Social connections:    Talks on phone: Not on file    Gets together: Not on file    Attends religious service: Not on file    Active member of club or organization: Not on file    Attends meetings of clubs or organizations: Not on file  Relationship status: Not on file  . Intimate partner violence:    Fear of current or ex partner: Not on file    Emotionally abused: Not on file    Physically abused: Not on file    Forced sexual activity: Not on file  Other Topics Concern  . Not on file  Social History Narrative  . Not on file    PHYSICAL EXAM  There were no vitals filed for this visit. There is no height or weight on file to calculate BMI.  Generalized: Well developed, in no acute distress   MMSE - Mini Mental State Exam 11/05/2018 05/15/2018  Orientation to time 0 1  Orientation to Place 2 2  Registration 2 3  Attention/ Calculation 0 0  Recall 0 1  Language- name 2 objects 2 2  Language- repeat 1 0  Language- follow 3 step command 3 0  Language- read & follow direction 0 1  Write a sentence 0 0  Copy design 0 0  Total score 10 10      Neurological examination  Mentation: Disoriented to year, season, date, able to provide some history. Able to follows most commands speech and language fluent, some delayed response Cranial nerve II-XII: Pupils were equal round reactive to light. Extraocular movements were full, visual field were full on confrontational test. Facial sensation and strength were normal. Uvula tongue midline. Head turning and shoulder shrug  were normal and  symmetric. Motor: The motor testing reveals 4 over 5 strength of all 4 extremities. Good symmetric motor tone is noted throughout.  Sensory: Sensory testing is intact to soft touch on all 4 extremities. No evidence of extinction is noted.  Coordination: Cerebellar testing reveals good finger-nose-finger and heel-to-shin bilaterally.  Gait and station: Gait is mildly unsteady, slow.  Reflexes: Deep tendon reflexes are symmetric and normal bilaterally.   DIAGNOSTIC DATA (LABS, IMAGING, TESTING) - I reviewed patient records, labs, notes, testing and imaging myself where available.  No results found for: WBC, HGB, HCT, MCV, PLT No results found for: NA, K, CL, CO2, GLUCOSE, BUN, CREATININE, CALCIUM, PROT, ALBUMIN, AST, ALT, ALKPHOS, BILITOT, GFRNONAA, GFRAA No results found for: CHOL, HDL, LDLCALC, LDLDIRECT, TRIG, CHOLHDL No results found for: HGBA1C No results found for: VITAMINB12 No results found for: TSH   ASSESSMENT AND PLAN 78 y.o. year old female  has a past medical history of Chronic low back pain, Cluster headache, COPD (chronic obstructive pulmonary disease) ([place]), CRF (chronic renal failure), GERD (gastroesophageal reflux disease), Hyperlipidemia, Syncopal episodes, and Tobacco use. here with:  1. Memory disorder   Her MMSE was 10/30.  She is able to converse fairly well, answers questions appropriately, with some assistance from sister for history.  She has already undergone MRI of the brain in December 2019. Dr.  Rexene Alberts had recommended neuropsychological testing at her last visit.  I will order this at this time, will provide an external referral.  We discussed memory medications, she does not wish to begin any memory medications, she would not be a candidate for Aricept given her poor appetite/weight loss.  We discussed the importance of her not driving, having supervision during cooking, should not be left alone for long periods of time.  She does seem to be aware of her  limitations, she knows she needs assistance with activities/supervision with cooking. At this time she does not wish to stop smoking, I suggested she maintain a healthy diet, drink plenty of water.  She should continue close follow-up with her  primary care doctor.  She will follow-up in 6 months with Dr. Rexene Alberts.    I spent 15 minutes with the patient. 50% of this time was spent discussing her plan of care and memory score.    Butler Denmark, AGNP-C, DNP 11/05/2018, 12:04 PM [facility] [address] [place], [place] [address] [phone]  I reviewed the above note and documentation by the Nurse Practitioner and agree with the history, exam, assessment and plan as outlined above. I was immediately available for consultation. Star Age, MD, PhD [facility])

## 2019-01-16 DIAGNOSIS — M810 Age-related osteoporosis without current pathological fracture: Secondary | ICD-10-CM | POA: Diagnosis not present

## 2019-01-16 DIAGNOSIS — E785 Hyperlipidemia, unspecified: Secondary | ICD-10-CM | POA: Diagnosis not present

## 2019-01-16 DIAGNOSIS — E559 Vitamin D deficiency, unspecified: Secondary | ICD-10-CM | POA: Diagnosis not present

## 2019-01-21 DIAGNOSIS — E559 Vitamin D deficiency, unspecified: Secondary | ICD-10-CM | POA: Diagnosis not present

## 2019-01-21 DIAGNOSIS — R413 Other amnesia: Secondary | ICD-10-CM | POA: Diagnosis not present

## 2019-01-21 DIAGNOSIS — M810 Age-related osteoporosis without current pathological fracture: Secondary | ICD-10-CM | POA: Diagnosis not present

## 2019-01-21 DIAGNOSIS — E7800 Pure hypercholesterolemia, unspecified: Secondary | ICD-10-CM | POA: Diagnosis not present

## 2019-01-21 DIAGNOSIS — R636 Underweight: Secondary | ICD-10-CM | POA: Diagnosis not present

## 2019-01-21 DIAGNOSIS — F17210 Nicotine dependence, cigarettes, uncomplicated: Secondary | ICD-10-CM | POA: Diagnosis not present

## 2019-01-21 DIAGNOSIS — Z0000 Encounter for general adult medical examination without abnormal findings: Secondary | ICD-10-CM | POA: Diagnosis not present

## 2019-04-23 DIAGNOSIS — E559 Vitamin D deficiency, unspecified: Secondary | ICD-10-CM | POA: Diagnosis not present

## 2019-04-23 DIAGNOSIS — J449 Chronic obstructive pulmonary disease, unspecified: Secondary | ICD-10-CM | POA: Diagnosis not present

## 2019-04-23 DIAGNOSIS — E785 Hyperlipidemia, unspecified: Secondary | ICD-10-CM | POA: Diagnosis not present

## 2019-04-23 DIAGNOSIS — Z23 Encounter for immunization: Secondary | ICD-10-CM | POA: Diagnosis not present

## 2019-04-23 DIAGNOSIS — R636 Underweight: Secondary | ICD-10-CM | POA: Diagnosis not present

## 2019-04-23 DIAGNOSIS — R413 Other amnesia: Secondary | ICD-10-CM | POA: Diagnosis not present

## 2019-05-07 ENCOUNTER — Other Ambulatory Visit: Payer: Self-pay

## 2019-05-07 ENCOUNTER — Ambulatory Visit: Payer: Medicare HMO | Admitting: Neurology

## 2019-05-07 ENCOUNTER — Encounter: Payer: Self-pay | Admitting: Neurology

## 2019-05-07 VITALS — BP 121/71 | HR 57 | Temp 97.5°F | Ht 60.0 in | Wt 90.8 lb

## 2019-05-07 DIAGNOSIS — F0390 Unspecified dementia without behavioral disturbance: Secondary | ICD-10-CM

## 2019-05-07 DIAGNOSIS — F03C0 Unspecified dementia, severe, without behavioral disturbance, psychotic disturbance, mood disturbance, and anxiety: Secondary | ICD-10-CM

## 2019-05-07 NOTE — Patient Instructions (Signed)
For your low back pain, you can take 1 extra strength Tylenol at night but I would not recommend it every night.  For sleep, you could try Melatonin: take 1 mg to 3 mg, one to 2 hours before your bedtime. You can go up to 5 mg if needed. It is over the counter and comes in pill form, chewable form and spray, if you prefer.   Please try to drink more water, 6 of the small bottles per day would be good, 8 ounce each.  Please try to eat a balanced diet and on a regular basis and supplement with Boost or Ensure.

## 2019-05-07 NOTE — Progress Notes (Signed)
Subjective:    Patient ID: Olivia Phillips is a 78 y.o. female.  HPI     Interim history:  Olivia Phillips is a 78 year old right-handed woman with an underlying medical history of syncopy, hyperlipidemia, COPD, reflux disease, history of headaches, smoking, chronic low back pain, underweight state, and vitamin D deficiency, who presents for follow-up consultation of her advanced memory loss.  The patient is accompanied by her sister again today.  I first met her at the request of her primary care physician on 05/15/2018.  Her MMSE was 10 at the time.  I advised her to proceed with a brain MRI and neuropsychological evaluation.  She was advised to quit smoking.  She saw Butler Denmark, nurse practitioner in the interim on 11/05/2018.  She had not stop smoking and was not ready to quit smoking.  She had not had any neuropsychological testing and she declined memory medication.  She had a brain MRI without contrast on 05/31/2018 and I reviewed the results: IMPRESSION: This MRI of the brain without contrast shows the following: 1.   Moderate generalized cortical atrophy.   This has slightly progressed compared to the CT scan from 2017. 2.   White matter changes in the hemispheres consistent with moderately severe chronic microvascular ischemic change.   This appears similar to what was observed on the CT scan from 2017. 3.   There are no acute findings.     We called her on 05/07/2018 with the test results.  Today, 05/07/2019: She reports No new problems, limited history from the patient, history is provided primarily from her sister.  Sister reports that patient still smokes, she smokes more than she remembers.  She does not drink enough water, maybe 1 bottle a day.  She lives with her son and 28 year old grandson.  She does have supervision most of the time and sister lives across the street, they have 2 siblings in [place], all the others live in [place], oldest sister is 50 and sometimes visits, the  others typically do not visit that much.  Patient does not eat on a regular basis,She has had some difficulty sleeping at night.  She has had no recent falls, sometimes she has low back pain.  This can bother Her at night as well.  The patient's allergies, current medications, family history, past medical history, past social history, past surgical history and problem list were reviewed and updated as appropriate.    Previously:   05/15/2018: (She).  Reports memory loss. She is not able to further elaborate. Her sister reports that she has had memory loss for 2 years, perhaps longer, likely longer than that. 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 She has a fifth grade education. She has 1 son who lives with her, age 25 and his son, her grandson lives there as well, age 24. She is by herself some during the day. Sister lives across the street. She has a total of 3 sisters and 4 brothers. The oldest sister who is in her early 40s has memory loss and patient's mother had memory loss, died at age 93. I reviewed your office note from 01/14/2018, which you kindly included. She saw Dr. Alvester Chou in cardiology after she had the syncopal spell which was deemed secondary to dehydration. She had a head CT without contrast on 02/16/2016 which showed: IMPRESSION: No acute intracranial findings.   Chronic ischemic microvascular disease.   Minimal opacification of the left mastoid air cells. She sleeps fairly  well but has a variable sleep schedule. She has been smoking since age 32 and has not considered stopping, she smokes about half a pack per day, does not use any alcohol, rarely drinks any water, drinks about 2-3 cups of tea per day which she makes herself. She cooks sometimes at her stove. She no longer drives, has not been driving for the past 2 years as she started getting lost. Her sister takes her to the grocery store. Patient admits that she has lost appetite and that she forgets to eat  sometimes, she typically eats one meal per day. She has been told to supplement but does not like and sure or boost and was told to supplement with [employer] and indicates that she drinks 1 or 2 per day at sister also reports that she buys it for her or with her and typically they by 24 in a month, which would average less than one per day.   Her Past Medical History Is Significant For: Past Medical History:  Diagnosis Date  . Chronic low back pain   . Cluster headache   . COPD (chronic obstructive pulmonary disease) ([place])   . CRF (chronic renal failure)   . GERD (gastroesophageal reflux disease)   . Hyperlipidemia   . Syncopal episodes   . Tobacco use     Her Past Surgical History Is Significant For: History reviewed. No pertinent surgical history.  Her Family History Is Significant For: Family History  Problem Relation Age of Onset  . Alzheimer's disease Mother   . Lung cancer Son     Her Social History Is Significant For: Social History   Socioeconomic History  . Marital status: Married    Spouse name: Not on file  . Number of children: Not on file  . Years of education: Not on file  . Highest education level: Not on file  Occupational History  . Not on file  Social Needs  . Financial resource strain: Not on file  . Food insecurity    Worry: Not on file    Inability: Not on file  . Transportation needs    Medical: Not on file    Non-medical: Not on file  Tobacco Use  . Smoking status: Current Every Day Smoker  . Smokeless tobacco: Never Used  Substance and Sexual Activity  . Alcohol use: No  . Drug use: No  . Sexual activity: Not on file  Lifestyle  . Physical activity    Days per week: Not on file    Minutes per session: Not on file  . Stress: Not on file  Relationships  . Social Herbalist on phone: Not on file    Gets together: Not on file    Attends religious service: Not on file    Active member of club or organization:  Not on file    Attends meetings of clubs or organizations: Not on file    Relationship status: Not on file  Other Topics Concern  . Not on file  Social History Narrative  . Not on file    Her Allergies Are:  Allergies  Allergen Reactions  . Codeine   . Alendronate Sodium Nausea And Vomiting  . Ibuprofen Other (See Comments)    Muscle spasms   :   Her Current Medications Are:  Outpatient Encounter Medications as of 05/07/2019  Medication Sig  . Calcium Carbonate-Vit D-Min (CALCIUM 1200 PO) Take 1 tablet by mouth daily.  . cholecalciferol (VITAMIN  D) 1000 units tablet Take 1,000 Units by mouth daily.  . Multiple Vitamin (MULTIVITAMIN WITH MINERALS) TABS tablet Take 1 tablet by mouth daily.  . pravastatin (PRAVACHOL) 40 MG tablet Take 40 mg by mouth daily.   No facility-administered encounter medications on file as of 05/07/2019.   :  Review of Systems:  Out of a complete 14 point review of systems, all are reviewed and negative with the exception of these symptoms as listed below: Review of Systems  Neurological:       Rm 2, sister. 6 month RV Memory.   No medications for memory.  MMSE 11/30.    Objective:  Neurological Exam  Physical Exam Physical Examination:   Vitals:   05/07/19 1446  BP: 121/71  Pulse: (!) 57  Temp: (!) 97.5 F (36.4 C)    General Examination: The patient is a very pleasant 78 y.o. female in no acute distress. She appears thin and frail, well groomed.   HEENT: Normocephalic, atraumatic, pupils are equal, round and reactive to light and accommodation.  Extraocular tracking is fairly well-preserved. She has no nystagmus. Hearing is grossly intact. Face is symmetric. Speech is clear, no dysarthria. Airway examination reveals moderate mouth dryness, otherwise no significant abnormalities, dentures in place, tongue protrudes centrally and palate elevates symmetrically.  Chest: Clear to auscultation without wheezing, rhonchi or crackles  noted.  Heart: S1+S2+0, regular and normal without murmurs, rubs or gallops noted.   Abdomen: Soft, non-tender and non-distended with normal bowel sounds appreciated on auscultation.  Extremities: There is no pitting edema in the distal lower extremities bilaterally.   Skin: Warm and dry without trophic changes noted.  Musculoskeletal: exam reveals no obvious joint deformities, tenderness or joint swelling or erythema.   Neurologically:  Mental status: The patient is awake, alert and oriented in all 4 spheres. Her immediate and remote memory, attention, language skills and fund of knowledge are significantly impaired. She is not able to give a very detailed history. She looks to her sister for additional information. Her sister provides most of her history. Mood appears normal, affect somewhat blunted.  On 05/15/2018: MMSE: 10/30, CDT: 0/4, AFT: 4/min.   On 05/07/2019: MMSE: 11/30, CDT: 0/4, AFT: 2/min.  Cranial nerves II - XII are as described above under HEENT exam. In addition: shoulder shrug is normal with equal shoulder height noted. Motor exam: thin bulk, global strength of 4 out of 5, no one-sided weakness, no tremor. Romberg is not tested for safety reasons. Fine motor skills are globally preserved for age. Normal bulk, strength and tone is noted. There is no drift, tremor or rebound. Romberg is negative. Cerebellar testing: No dysmetria or intention tremor.  Sensory exam: intact to light touch in the upper and lower extremities.  Gait, station and balance: She stands Without difficulty and walks without a walking aid or assistance.  Assessment and Plan:  In summary, Olivia Phillips is a very pleasant 78 year old female with an underlying medical history of syncopy, hyperlipidemia, COPD, reflux disease, history of headaches, smoking, chronic low back pain, underweight state, and vitamin D deficiency, who presents for Follow-up consultation of her advanced memory loss of at  least 3 years duration, she has multiple vascular risk factors and her brain MRI in December 2019 showed advanced white matter changes and a moderate degree of atrophy.  She continues to smoke.  She has reduced her smoking, she will not completely stop.  She does not always hydrate well with water, it is estimated that  she drinks about 1 bottle of water per day, 16 ounce. She likes to drink coffee.  Her memory scores have been low but stable in the past year.She likes to drink coffee.  Her memory scores have been low but stable in the past year. She does not drive a car any longer, has not driven in the past 2 years or so.  She lives with her son and grandson. She does not wish to try any new medication at this time.  She does not wish to proceed with neuropsychological testing and her sister reports that it will probably not be possible to do it.  We will forego neuropsychological testing, I suggested low-dose Namenda which has a reasonably good side effect profile but the patient declines.Patient sister reports that she will likely not take medication, either forget it or not take it.  We will continue to monitor.  I suggested that she could take Tylenol at night if she has back discomfort, not every night however.  I also suggested she could try melatonin at night for sleep.  I advised her to quit smoking completely.  I advised her to stay wellHydrated with water and well nourished, supplement with either boost or Ensure.  She does drink Carnation breakfast sometimes. I suggested a follow-up in 6 months with one of our nurse practitioners.  I answered all their questions today and the patient and her sister were in agreement. I spent 20 minutes in total face-to-face time with the patient, more than 50% of which was spent in counseling and coordination of care, reviewing test results, reviewing medication and discussing or reviewing the diagnosis of dementia, its prognosis and treatment options. Pertinent  laboratory and imaging test results that were available during this visit with the patient were reviewed by me and considered in my medical decision making (see chart for details).

## 2019-07-01 DIAGNOSIS — H2513 Age-related nuclear cataract, bilateral: Secondary | ICD-10-CM | POA: Diagnosis not present

## 2019-07-01 DIAGNOSIS — H524 Presbyopia: Secondary | ICD-10-CM | POA: Diagnosis not present

## 2019-07-09 DIAGNOSIS — E7800 Pure hypercholesterolemia, unspecified: Secondary | ICD-10-CM | POA: Diagnosis not present

## 2019-07-16 DIAGNOSIS — F17211 Nicotine dependence, cigarettes, in remission: Secondary | ICD-10-CM | POA: Diagnosis not present

## 2019-07-16 DIAGNOSIS — E785 Hyperlipidemia, unspecified: Secondary | ICD-10-CM | POA: Diagnosis not present

## 2019-07-16 DIAGNOSIS — R636 Underweight: Secondary | ICD-10-CM | POA: Diagnosis not present

## 2019-07-16 DIAGNOSIS — F0390 Unspecified dementia without behavioral disturbance: Secondary | ICD-10-CM | POA: Diagnosis not present

## 2019-07-16 DIAGNOSIS — I10 Essential (primary) hypertension: Secondary | ICD-10-CM | POA: Diagnosis not present

## 2019-08-25 ENCOUNTER — Ambulatory Visit: Payer: Medicare HMO | Attending: Internal Medicine

## 2019-08-25 DIAGNOSIS — Z23 Encounter for immunization: Secondary | ICD-10-CM

## 2019-08-25 NOTE — Progress Notes (Signed)
   Covid-19 Vaccination Clinic  Name:  MORAYO JUNIUS    MRN: [id number] DOB: 1941/02/25  08/25/2019  Ms. Waker was observed post Covid-19 immunization for 15 minutes without incident. She was provided with Vaccine Information Sheet and instruction to access the V-Safe system.   Ms. Gatt was instructed to call 911 with any severe reactions post vaccine: Marland Kitchen Difficulty breathing  . Swelling of face and throat  . A fast heartbeat  . A bad rash all over body  . Dizziness and weakness   Immunizations Administered    Name Date Dose VIS Date Route   Pfizer COVID-19 Vaccine 08/25/2019 11:24 AM 0.3 mL 05/16/2019 Intramuscular   Manufacturer: [place]   Lot: [id number]   [place]: [id number]

## 2019-09-17 ENCOUNTER — Ambulatory Visit: Payer: Medicare HMO | Attending: Internal Medicine

## 2019-09-17 DIAGNOSIS — Z23 Encounter for immunization: Secondary | ICD-10-CM

## 2019-09-17 NOTE — Progress Notes (Signed)
   Covid-19 Vaccination Clinic  Name:  Olivia Phillips    MRN: [id number] DOB: Feb 15, 1941  09/17/2019  [name] was observed post Covid-19 immunization for 15 minutes without incident. She was provided with Vaccine Information Sheet and instruction to access the V-Safe system.   [name] was instructed to call 911 with any severe reactions post vaccine: Marland Kitchen Difficulty breathing  . Swelling of face and throat  . A fast heartbeat  . A bad rash all over body  . Dizziness and weakness   Immunizations Administered    Name Date Dose VIS Date Route   Pfizer COVID-19 Vaccine 09/17/2019 11:45 AM 0.3 mL 05/16/2019 Intramuscular   Manufacturer: [place]   Lot: [id number]   [place]: [id number]

## 2019-09-23 ENCOUNTER — Telehealth: Payer: Self-pay | Admitting: Internal Medicine

## 2019-09-23 NOTE — Telephone Encounter (Signed)
Scheduled [facility] Palliative visit for 09-30-19 at 11:00.

## 2019-09-29 NOTE — Progress Notes (Signed)
April 27th, 2021 [facility] Palliative Care Consult Note Telephone: [phone]  Fax: [phone]  PATIENT NAME: Olivia Phillips DOB: 08/10/1940 MRN: [id number] [address]  [place] [address] [phone] (Mobile)   [phone] (Home Phone)  PRIMARY CARE PROVIDER:   Deland Pretty, MD Dr. Star Age (Neurology)  REFERRING PROVIDER:  Deland Pretty, [place],   [address]  [place]: (son/HCPOA) Haynes Hoehn [phone]. (sister/HCPOA) Payton Emerald (H) [phone] [id number], [phone]. [address] [id number]   ASSESSMENT / RECOMMENDATIONS:  1. Advance Care Planning: A. Directives: Discussed  with sister/HCPOA Alice, who does not desire CPR for patient, in the event of a cardiopulmonary arrest. I completed a DNR form, uploaded this into [facility] [employer], and provided copy to family. I suggested they post on the fridge, as this is where EMS would look for it, should they be summoned to the home. We discussed aspects of the MOST form. Alice wishes to further review with patient's son/second HCPOA before completing.  B. Goals of Care: Family wish facility placement d/t caregiver burden. I did discuss some techniques to help ease their stress. Patient has some limited funds set aside for care. I suggested the family could potentially tap into these to pay for home aide services.   2. Symptom Management / Cognitive / Functional status: FAST dementia scale: 6c. Patient is alert and oriented to self and place. Very poor short- term memory. She recognizes her son with whom she lives, and usually her sister, who is the day time primary care giver. She is conversant in very short sentences; pleasant. Can follow simple commands. She can be resistive to bathing and occasionally be difficult to redirect, but usually no behavioral problems. Needs 24 hours supervision to ensure doesn't wander from the home, and d/t poor safety  awareness. Family has ensured all knives in the home are not available to her. She has broken glass (accidental) in the past, so they watch for that. Family took patient's cigarettes away from her 3 months ago and she hasn't seemed to miss them. Patient sleeps through the night. Alice notes she tends to constantly wander about the house and yard in a driven manner, especially in the late mornings/early afternoons. She drinks a bit of caffeinated coffee in the am.  Patient ambulates with slightly unsteady gait, unable to cognate use of assistive devices. No falls.   Patient can assist a with bathing in the tub. Alice notes difficulty managing her bath and washing her mid length long hair. Patient can feed herself and is independent in toileting but needs to be dressed. Patient had recent episode voiding into the tub, and Alice voices worry about future incontinent episodes.   Patient's appetite has picked up. She consumes 100% of 2 meals/day. Her weight 3 months ago was 97 lbs, but Alice feels she has gained some more as her pants are fitting more tightly.   Low back pain from arthritis; Alice believes it's constant by patient's body language; improved with prn Tylenol. Patient doesn't verbally complain.   Patient takes no medications save for occasional Tylenol and some days a MTV.   -Recommend deadbolt for doors to decrease nocturnal wandering risk,  -look into wearable monitoring device to locate patient should she leave the home (patient's son sometimes takes some short trips out of the home, so patient unmonitored; patient sometimes wanders across the street to sister Alice's home),  -try putting patient on a toileting schedule (  q 3-4hr) to help decrease chance of incontinence as dementia progresses,  -change am coffee to decaf,  -see is son can put a handheld device on shower head for ease of bathing (currently does a tub bath),  -take the knobs off of the stove,  -I'll obtain a list of memory  care facilities in [place] and [place] and mail to [place] (she doesn't have an e-mail),  -Alice will contact me if further signs of patient decline, or to complete the most form,  -have patient take scheduled Tylenol bid for maintenance relief of back pain,  -we talked about making bathing easier; could cut patient's hair to above shoulders, wash hair with soapy/clean wash cloths as an alternative to tub bath.   3. Family Supports: Lives with only son and 73 yo grandson. Sister Danton Clap lives across the street. Alice watches patient in patient's home when son is at work. Other siblings and relitives are not involved in patient's care. Danton Clap (and son) are feeling overwhelmed with the 24/7 responsibility of  supervised care giving, and wish assistance exploring facility care. I will mail her list of memory care facilities, and urged the family to visit these, and ask about financial criteria for admission.   4. Follow up Palliative Care Visit: prn signs of decline, or when wish to complete MOST form.  I spent 60 minutes providing this consultation from 11am to noon. More than 50% of the time in this consultation was spent coordinating communication.   HISTORY OF PRESENT ILLNESS:  Olivia Phillips is an 79 y.o.  female with h/o HTN, underweight, advanced dementia, HLD, syncopy (thought r/t dehydration), COPD (smoker), GERD, headaches, chronic low back pain, vit D deficiency.  Palliative Care was asked to help address goals of care.   CODE STATUS: DNR  PPS: 50% HOSPICE ELIGIBILITY/DIAGNOSIS: TBD  PAST MEDICAL HISTORY:  Past Medical History:  Diagnosis Date  . Chronic low back pain   . Cluster headache   . COPD (chronic obstructive pulmonary disease) ([place])   . CRF (chronic renal failure)   . GERD (gastroesophageal reflux disease)   . Hyperlipidemia   . Syncopal episodes   . Tobacco use     SOCIAL HX:  Social History   Tobacco Use  . Smoking status: Former Research scientist (life sciences)  . Smokeless tobacco:  Never Used  Substance Use Topics  . Alcohol use: No    ALLERGIES:  Allergies  Allergen Reactions  . Codeine   . Alendronate Sodium Nausea And Vomiting  . Ibuprofen Other (See Comments)    Muscle spasms      PERTINENT MEDICATIONS:  Outpatient Encounter Medications as of 09/30/2019  Medication Sig  . acetaminophen (TYLENOL) 500 MG tablet Take 500 mg by mouth every 6 (six) hours as needed.  . Multiple Vitamin (MULTIVITAMIN WITH MINERALS) TABS tablet Take 1 tablet by mouth daily.  . [DISCONTINUED] Calcium Carbonate-Vit D-Min (CALCIUM 1200 PO) Take 1 tablet by mouth daily.  . [DISCONTINUED] cholecalciferol (VITAMIN D) 1000 units tablet Take 1,000 Units by mouth daily.  . [DISCONTINUED] pravastatin (PRAVACHOL) 40 MG tablet Take 40 mg by mouth daily.   No facility-administered encounter medications on file as of 09/30/2019.    PHYSICAL EXAM:   Pleasant slender elderly female in no distress. Gets up and wanders in /out of the home as I am talking to her and her sister, on the front porch.  PE deferred to decrease COVID risk No pedal edema.  Neurological: grossly non-focal  Julianne Handler, NP

## 2019-09-30 ENCOUNTER — Encounter: Payer: Self-pay | Admitting: Internal Medicine

## 2019-09-30 ENCOUNTER — Other Ambulatory Visit: Payer: Medicare HMO | Admitting: Internal Medicine

## 2019-09-30 ENCOUNTER — Other Ambulatory Visit: Payer: Self-pay

## 2019-09-30 DIAGNOSIS — Z515 Encounter for palliative care: Secondary | ICD-10-CM | POA: Diagnosis not present

## 2019-09-30 DIAGNOSIS — Z7189 Other specified counseling: Secondary | ICD-10-CM | POA: Diagnosis not present

## 2019-10-23 ENCOUNTER — Telehealth: Payer: Self-pay | Admitting: Neurology

## 2019-10-23 NOTE — Telephone Encounter (Signed)
I called pt. No answer, left a message asking pt to call me back.   

## 2019-10-23 NOTE — Telephone Encounter (Signed)
Pt sister alice Called to advise pt is constantly up walking and they can hardly keep her down so she can go to sleep and get rest. She would like a CB from RN to possibly discuss a medication pt could take that would help her

## 2019-10-23 NOTE — Telephone Encounter (Signed)
Pt's sister called back and stated the pt is struggling with staying asleep and getting up multiple times during the night.  She reports the pt did try Melatonin but was unsure of the MG. I asked if there was way to verify the dosage to see if the pt had tried the 5 mg melatonin and if not we could try this. Pt's sister sts she will check the pt's meds and confirm melatonin dosage and call back and we can further discuss.

## 2019-11-06 ENCOUNTER — Ambulatory Visit: Payer: Medicare HMO | Admitting: Neurology

## 2020-01-20 DIAGNOSIS — M810 Age-related osteoporosis without current pathological fracture: Secondary | ICD-10-CM | POA: Diagnosis not present

## 2020-01-20 DIAGNOSIS — E7800 Pure hypercholesterolemia, unspecified: Secondary | ICD-10-CM | POA: Diagnosis not present

## 2020-01-20 DIAGNOSIS — Z0000 Encounter for general adult medical examination without abnormal findings: Secondary | ICD-10-CM | POA: Diagnosis not present

## 2020-01-20 DIAGNOSIS — E785 Hyperlipidemia, unspecified: Secondary | ICD-10-CM | POA: Diagnosis not present

## 2020-01-20 DIAGNOSIS — E559 Vitamin D deficiency, unspecified: Secondary | ICD-10-CM | POA: Diagnosis not present

## 2020-01-27 DIAGNOSIS — F0390 Unspecified dementia without behavioral disturbance: Secondary | ICD-10-CM | POA: Diagnosis not present

## 2020-01-27 DIAGNOSIS — Z0000 Encounter for general adult medical examination without abnormal findings: Secondary | ICD-10-CM | POA: Diagnosis not present

## 2020-01-27 DIAGNOSIS — R748 Abnormal levels of other serum enzymes: Secondary | ICD-10-CM | POA: Diagnosis not present

## 2020-01-27 DIAGNOSIS — E559 Vitamin D deficiency, unspecified: Secondary | ICD-10-CM | POA: Diagnosis not present

## 2020-01-27 DIAGNOSIS — E785 Hyperlipidemia, unspecified: Secondary | ICD-10-CM | POA: Diagnosis not present

## 2020-01-30 ENCOUNTER — Emergency Department (HOSPITAL_COMMUNITY): Payer: Medicare HMO

## 2020-01-30 ENCOUNTER — Inpatient Hospital Stay (HOSPITAL_COMMUNITY)
Admission: EM | Admit: 2020-01-30 | Discharge: 2020-02-02 | DRG: 481 | Disposition: A | Discharge status: Skilled Nursing Facility | Payer: Medicare HMO | Attending: Internal Medicine | Admitting: Internal Medicine

## 2020-01-30 ENCOUNTER — Encounter (HOSPITAL_COMMUNITY): Payer: Self-pay | Admitting: Student

## 2020-01-30 ENCOUNTER — Inpatient Hospital Stay (HOSPITAL_COMMUNITY): Payer: Medicare HMO

## 2020-01-30 ENCOUNTER — Other Ambulatory Visit: Payer: Self-pay

## 2020-01-30 DIAGNOSIS — R55 Syncope and collapse: Secondary | ICD-10-CM | POA: Diagnosis not present

## 2020-01-30 DIAGNOSIS — Z886 Allergy status to analgesic agent status: Secondary | ICD-10-CM | POA: Diagnosis not present

## 2020-01-30 DIAGNOSIS — J449 Chronic obstructive pulmonary disease, unspecified: Secondary | ICD-10-CM | POA: Diagnosis not present

## 2020-01-30 DIAGNOSIS — Z888 Allergy status to other drugs, medicaments and biological substances status: Secondary | ICD-10-CM

## 2020-01-30 DIAGNOSIS — S72141A Displaced intertrochanteric fracture of right femur, initial encounter for closed fracture: Secondary | ICD-10-CM | POA: Diagnosis not present

## 2020-01-30 DIAGNOSIS — S72001A Fracture of unspecified part of neck of right femur, initial encounter for closed fracture: Secondary | ICD-10-CM | POA: Diagnosis not present

## 2020-01-30 DIAGNOSIS — D72829 Elevated white blood cell count, unspecified: Secondary | ICD-10-CM | POA: Diagnosis present

## 2020-01-30 DIAGNOSIS — R739 Hyperglycemia, unspecified: Secondary | ICD-10-CM | POA: Diagnosis present

## 2020-01-30 DIAGNOSIS — Z01818 Encounter for other preprocedural examination: Secondary | ICD-10-CM

## 2020-01-30 DIAGNOSIS — W06XXXA Fall from bed, initial encounter: Secondary | ICD-10-CM | POA: Diagnosis present

## 2020-01-30 DIAGNOSIS — F0390 Unspecified dementia without behavioral disturbance: Secondary | ICD-10-CM | POA: Diagnosis present

## 2020-01-30 DIAGNOSIS — Z820 Family history of epilepsy and other diseases of the nervous system: Secondary | ICD-10-CM | POA: Diagnosis not present

## 2020-01-30 DIAGNOSIS — R636 Underweight: Secondary | ICD-10-CM | POA: Diagnosis present

## 2020-01-30 DIAGNOSIS — E785 Hyperlipidemia, unspecified: Secondary | ICD-10-CM | POA: Diagnosis present

## 2020-01-30 DIAGNOSIS — W19XXXA Unspecified fall, initial encounter: Secondary | ICD-10-CM

## 2020-01-30 DIAGNOSIS — Z20822 Contact with and (suspected) exposure to covid-19: Secondary | ICD-10-CM | POA: Diagnosis not present

## 2020-01-30 DIAGNOSIS — Z681 Body mass index (BMI) 19 or less, adult: Secondary | ICD-10-CM

## 2020-01-30 DIAGNOSIS — R52 Pain, unspecified: Secondary | ICD-10-CM | POA: Diagnosis not present

## 2020-01-30 DIAGNOSIS — S7291XA Unspecified fracture of right femur, initial encounter for closed fracture: Secondary | ICD-10-CM

## 2020-01-30 DIAGNOSIS — M6281 Muscle weakness (generalized): Secondary | ICD-10-CM | POA: Diagnosis not present

## 2020-01-30 DIAGNOSIS — Z79899 Other long term (current) drug therapy: Secondary | ICD-10-CM | POA: Diagnosis not present

## 2020-01-30 DIAGNOSIS — Z8781 Personal history of (healed) traumatic fracture: Secondary | ICD-10-CM

## 2020-01-30 DIAGNOSIS — M2550 Pain in unspecified joint: Secondary | ICD-10-CM | POA: Diagnosis not present

## 2020-01-30 DIAGNOSIS — D62 Acute posthemorrhagic anemia: Secondary | ICD-10-CM | POA: Diagnosis not present

## 2020-01-30 DIAGNOSIS — Z4789 Encounter for other orthopedic aftercare: Secondary | ICD-10-CM | POA: Diagnosis not present

## 2020-01-30 DIAGNOSIS — Z87891 Personal history of nicotine dependence: Secondary | ICD-10-CM | POA: Diagnosis not present

## 2020-01-30 DIAGNOSIS — S7291XS Unspecified fracture of right femur, sequela: Secondary | ICD-10-CM | POA: Diagnosis not present

## 2020-01-30 DIAGNOSIS — Z885 Allergy status to narcotic agent status: Secondary | ICD-10-CM | POA: Diagnosis not present

## 2020-01-30 DIAGNOSIS — Z66 Do not resuscitate: Secondary | ICD-10-CM | POA: Diagnosis not present

## 2020-01-30 DIAGNOSIS — S72101A Unspecified trochanteric fracture of right femur, initial encounter for closed fracture: Secondary | ICD-10-CM | POA: Diagnosis not present

## 2020-01-30 DIAGNOSIS — G8929 Other chronic pain: Secondary | ICD-10-CM | POA: Diagnosis not present

## 2020-01-30 DIAGNOSIS — E559 Vitamin D deficiency, unspecified: Secondary | ICD-10-CM | POA: Diagnosis present

## 2020-01-30 DIAGNOSIS — R5381 Other malaise: Secondary | ICD-10-CM | POA: Diagnosis not present

## 2020-01-30 DIAGNOSIS — R262 Difficulty in walking, not elsewhere classified: Secondary | ICD-10-CM | POA: Diagnosis not present

## 2020-01-30 DIAGNOSIS — R1312 Dysphagia, oropharyngeal phase: Secondary | ICD-10-CM | POA: Diagnosis not present

## 2020-01-30 DIAGNOSIS — R030 Elevated blood-pressure reading, without diagnosis of hypertension: Secondary | ICD-10-CM

## 2020-01-30 DIAGNOSIS — R41841 Cognitive communication deficit: Secondary | ICD-10-CM | POA: Diagnosis not present

## 2020-01-30 DIAGNOSIS — M25551 Pain in right hip: Secondary | ICD-10-CM

## 2020-01-30 DIAGNOSIS — Z7401 Bed confinement status: Secondary | ICD-10-CM | POA: Diagnosis not present

## 2020-01-30 DIAGNOSIS — S79929A Unspecified injury of unspecified thigh, initial encounter: Secondary | ICD-10-CM | POA: Diagnosis not present

## 2020-01-30 DIAGNOSIS — R410 Disorientation, unspecified: Secondary | ICD-10-CM | POA: Diagnosis not present

## 2020-01-30 LAB — BASIC METABOLIC PANEL WITH GFR
Anion gap: 11 (ref 5–15)
BUN: 23 mg/dL (ref 8–23)
CO2: 25 mmol/L (ref 22–32)
Calcium: 8.8 mg/dL — ABNORMAL LOW (ref 8.9–10.3)
Chloride: 102 mmol/L (ref 98–111)
Creatinine, Ser: 0.8 mg/dL (ref 0.44–1.00)
GFR calc Af Amer: >60 mL/min
GFR calc non Af Amer: >60 mL/min
Glucose, Bld: 142 mg/dL — ABNORMAL HIGH (ref 70–99)
Potassium: 4.2 mmol/L (ref 3.5–5.1)
Sodium: 138 mmol/L (ref 135–145)

## 2020-01-30 LAB — CBC WITH DIFFERENTIAL/PLATELET
Abs Immature Granulocytes: 0.14 10*3/uL — ABNORMAL HIGH (ref 0.00–0.07)
Basophils Absolute: 0 10*3/uL (ref 0.0–0.1)
Basophils Relative: 0 %
Eosinophils Absolute: 0 10*3/uL (ref 0.0–0.5)
Eosinophils Relative: 0 %
HCT: 44.9 % (ref 36.0–46.0)
Hemoglobin: 14.4 g/dL (ref 12.0–15.0)
Immature Granulocytes: 1 %
Lymphocytes Relative: 8 %
Lymphs Abs: 1.1 10*3/uL (ref 0.7–4.0)
MCH: 29.8 pg (ref 26.0–34.0)
MCHC: 32.1 g/dL (ref 30.0–36.0)
MCV: 92.8 fL (ref 80.0–100.0)
Monocytes Absolute: 1.3 10*3/uL — ABNORMAL HIGH (ref 0.1–1.0)
Monocytes Relative: 9 %
Neutro Abs: 11.5 10*3/uL — ABNORMAL HIGH (ref 1.7–7.7)
Neutrophils Relative %: 82 %
Platelets: 312 10*3/uL (ref 150–400)
RBC: 4.84 MIL/uL (ref 3.87–5.11)
RDW: 13.2 % (ref 11.5–15.5)
WBC: 14.1 10*3/uL — ABNORMAL HIGH (ref 4.0–10.5)
nRBC: 0 % (ref 0.0–0.2)

## 2020-01-30 LAB — ABO/RH: ABO/RH(D): O POS

## 2020-01-30 LAB — SURGICAL PCR SCREEN
MRSA, PCR: NEGATIVE
Staphylococcus aureus: POSITIVE — AB

## 2020-01-30 LAB — GLUCOSE, CAPILLARY: Glucose-Capillary: 173 mg/dL — ABNORMAL HIGH (ref 70–99)

## 2020-01-30 LAB — PROTIME-INR
INR: 1 (ref 0.8–1.2)
Prothrombin Time: 12.5 seconds (ref 11.4–15.2)

## 2020-01-30 LAB — TYPE AND SCREEN
ABO/RH(D): O POS
Antibody Screen: NEGATIVE

## 2020-01-30 LAB — SARS CORONAVIRUS 2 BY RT PCR (HOSPITAL ORDER, PERFORMED IN ~~LOC~~ HOSPITAL LAB): SARS Coronavirus 2: NEGATIVE

## 2020-01-30 MED ORDER — FENTANYL CITRATE (PF) 100 MCG/2ML IJ SOLN
50.0000 ug | Freq: Once | INTRAMUSCULAR | Status: AC
  Administered 2020-01-30: 50 ug via INTRAVENOUS
  Filled 2020-01-30: qty 2

## 2020-01-30 MED ORDER — MORPHINE SULFATE (PF) 2 MG/ML IV SOLN: 0.5000 mg | INTRAVENOUS | Status: DC | PRN

## 2020-01-30 MED ORDER — HYDROCODONE-ACETAMINOPHEN 5-325 MG PO TABS
1.0000 | ORAL_TABLET | Freq: Four times a day (QID) | ORAL | Status: DC | PRN
  Administered 2020-01-30 – 2020-01-31 (×3): 1 via ORAL
  Filled 2020-01-30 (×3): qty 1

## 2020-01-30 MED ORDER — DOCUSATE SODIUM 100 MG PO CAPS
100.0000 mg | ORAL_CAPSULE | Freq: Two times a day (BID) | ORAL | Status: DC
  Administered 2020-01-30 – 2020-02-02 (×6): 100 mg via ORAL
  Filled 2020-01-30 (×7): qty 1

## 2020-01-30 MED ORDER — SENNA 8.6 MG PO TABS
1.0000 | ORAL_TABLET | Freq: Two times a day (BID) | ORAL | Status: DC
  Administered 2020-01-30 – 2020-02-02 (×5): 8.6 mg via ORAL
  Filled 2020-01-30 (×6): qty 1

## 2020-01-30 MED ORDER — SODIUM CHLORIDE 0.9 % IV SOLN
INTRAVENOUS | Status: DC
  Administered 2020-01-31: 75 mL/h via INTRAVENOUS

## 2020-01-30 NOTE — ED Notes (Signed)
Hospital transport called to take pt upstairs.

## 2020-01-30 NOTE — Consult Note (Signed)
Reason for Consult:Right hip fracture Referring Physician: [name] is an 79 y.o. female.  HPI:  Patient is an 79 year old female with medical history significant for dementia, gastric esophageal reflux disease, hyperlipidemia, tobacco abuse, syncopal episode, chronic renal failure and COPD.  She presents to the ER today after mechanical fall 1 day ago.  She was found to have a right hip intertrochanteric fracture on radiograph.  We are consulted to evaluate her hip fracture.  Unable to obtain history due to patient's dementia no family present at bedside.  Past Medical History:  Diagnosis Date  . Chronic low back pain   . Cluster headache   . COPD (chronic obstructive pulmonary disease) ([place])   . CRF (chronic renal failure)   . GERD (gastroesophageal reflux disease)   . Hyperlipidemia   . Syncopal episodes   . Tobacco use     History reviewed. No pertinent surgical history.  Family History  Problem Relation Age of Onset  . Alzheimer's disease Mother   . Lung cancer Son     Social History:  reports that she has quit smoking. She has never used smokeless tobacco. She reports that she does not drink alcohol and does not use drugs.  Allergies:  Allergies  Allergen Reactions  . Codeine   . Alendronate Sodium Nausea And Vomiting  . Ibuprofen Other (See Comments)    Muscle spasms     Medications: I have reviewed the patient's current medications.  Results for orders placed or performed during the hospital encounter of 01/30/20 (from the past 48 hour(s))  Basic metabolic panel     Status: Abnormal   Collection Time: 01/30/20 11:17 AM  Result Value Ref Range   Sodium 138 135 - 145 mmol/L   Potassium 4.2 3.5 - 5.1 mmol/L   Chloride 102 98 - 111 mmol/L   CO2 25 22 - 32 mmol/L   Glucose, Bld 142 (H) 70 - 99 mg/dL    Comment: Glucose reference range applies only to samples taken after fasting for at least 8 hours.   BUN 23 8 - 23 mg/dL   Creatinine, Ser 0.80 0.44  - 1.00 mg/dL   Calcium 8.8 (L) 8.9 - 10.3 mg/dL   GFR calc non Af Amer >60 >60 mL/min   GFR calc Af Amer >60 >60 mL/min   Anion gap 11 5 - 15    Comment: Performed at [facility], [place] [address] [place], [place] [address]  CBC WITH DIFFERENTIAL     Status: Abnormal   Collection Time: 01/30/20 11:17 AM  Result Value Ref Range   WBC 14.1 (H) 4.0 - 10.5 K/uL   RBC 4.84 3.87 - 5.11 MIL/uL   Hemoglobin 14.4 12.0 - 15.0 g/dL   HCT 44.9 36 - 46 %   MCV 92.8 80.0 - 100.0 fL   MCH 29.8 26.0 - 34.0 pg   MCHC 32.1 30.0 - 36.0 g/dL   RDW 13.2 11.5 - 15.5 %   Platelets 312 150 - 400 K/uL   nRBC 0.0 0.0 - 0.2 %   Neutrophils Relative % 82 %   Neutro Abs 11.5 (H) 1.7 - 7.7 K/uL   Lymphocytes Relative 8 %   Lymphs Abs 1.1 0.7 - 4.0 K/uL   Monocytes Relative 9 %   Monocytes Absolute 1.3 (H) 0 - 1 K/uL   Eosinophils Relative 0 %   Eosinophils Absolute 0.0 0 - 0 K/uL   Basophils Relative 0 %   Basophils Absolute  0.0 0 - 0 K/uL   Immature Granulocytes 1 %   Abs Immature Granulocytes 0.14 (H) 0.00 - 0.07 K/uL    Comment: Performed at [facility], [place] [address], [place], [place] [address]  Protime-INR     Status: None   Collection Time: 01/30/20 11:17 AM  Result Value Ref Range   Prothrombin Time 12.5 11.4 - 15.2 seconds   INR 1.0 0.8 - 1.2    Comment: (NOTE) INR goal varies based on device and disease states. Performed at [facility], [place] [address], [place], [place] [address]   Type and screen [place]     Status: None   Collection Time: 01/30/20 11:17 AM  Result Value Ref Range   ABO/RH(D) O POS    Antibody Screen NEG    Sample Expiration      02/02/2020,2359 Performed at [facility], [place] [address], [place], [place] [address]   SARS Coronavirus 2 by RT PCR (hospital order, performed in [facility] hospital lab) Nasopharyngeal Nasopharyngeal Swab     Status: None   Collection Time:  01/30/20 11:17 AM   Specimen: Nasopharyngeal Swab  Result Value Ref Range   SARS Coronavirus 2 NEGATIVE NEGATIVE    Comment: (NOTE) SARS-CoV-2 target nucleic acids are NOT DETECTED.  The SARS-CoV-2 RNA is generally detectable in upper and lower respiratory specimens during the acute phase of infection. The lowest concentration of SARS-CoV-2 viral copies this assay can detect is 250 copies / mL. A negative result does not preclude SARS-CoV-2 infection and should not be used as the sole basis for treatment or other patient management decisions.  A negative result may occur with improper specimen collection / handling, submission of specimen other than nasopharyngeal swab, presence of viral mutation(s) within the areas targeted by this assay, and inadequate number of viral copies (<250 copies / mL). A negative result must be combined with clinical observations, patient history, and epidemiological information.  Fact Sheet for Patients:   StrictlyIdeas.no  Fact Sheet for Healthcare Providers: BankingDealers.co.za  This test is not yet approved or  cleared by the [place] FDA and has been authorized for detection and/or diagnosis of SARS-CoV-2 by FDA under an Emergency Use Authorization (EUA).  This EUA will remain in effect (meaning this test can be used) for the duration of the COVID-19 declaration under Section 564(b)(1) of the Act, 21 U.S.C. section 360bbb-3(b)(1), unless the authorization is terminated or revoked sooner.  Performed at [facility], [place] [address] [place],  [address]     DG Pelvis 1-2 Views  Result Date: 01/30/2020 CLINICAL DATA:  Fall, hip pain EXAM: PELVIS - 1-2 VIEW; RIGHT FEMUR 2 VIEWS COMPARISON:  None. FINDINGS: Osteopenia. There is a slightly impacted and foreshortened inter trochanteric fracture of the proximal right femur. No obvious fracture of the pelvis or proximal left  femur seen in single frontal view. No fracture or dislocation of the distal right femur. IMPRESSION: 1. There is a slightly impacted and foreshortened intertrochanteric fracture of the proximal right femur. 2.  No fracture or dislocation of the distal right femur. Electronically Signed   By: Eddie Candle M.D.   On: 01/30/2020 13:15   Chest Portable 1 View  Result Date: 01/30/2020 CLINICAL DATA:  Preop for right hip fracture. EXAM: PORTABLE CHEST 1 VIEW COMPARISON:  None. FINDINGS: The heart size and mediastinal contours are within normal limits. Both lungs are clear. The visualized skeletal structures are unremarkable. IMPRESSION:  No active disease. Aortic Atherosclerosis (ICD10-I70.0). Electronically Signed   By: Marijo Conception M.D.   On: 01/30/2020 15:55   DG Femur Min 2 Views Right  Result Date: 01/30/2020 CLINICAL DATA:  Fall, hip pain EXAM: PELVIS - 1-2 VIEW; RIGHT FEMUR 2 VIEWS COMPARISON:  None. FINDINGS: Osteopenia. There is a slightly impacted and foreshortened inter trochanteric fracture of the proximal right femur. No obvious fracture of the pelvis or proximal left femur seen in single frontal view. No fracture or dislocation of the distal right femur. IMPRESSION: 1. There is a slightly impacted and foreshortened intertrochanteric fracture of the proximal right femur. 2.  No fracture or dislocation of the distal right femur. Electronically Signed   By: Eddie Candle M.D.   On: 01/30/2020 13:15    Review of Systems  Unable to perform ROS: Dementia   Blood pressure (!) 149/91, pulse 93, temperature 98.7 F (37.1 C), resp. rate (!) 23, height 5' (1.524 m), weight 41 kg, SpO2 98 %. Physical Exam Constitutional:      Appearance: She is not ill-appearing or diaphoretic.  Cardiovascular:     Pulses: Normal pulses.  Pulmonary:     Effort: Pulmonary effort is normal.  Neurological:     Mental Status: She is alert. She is disoriented.   Bilateral lower extremities right leg shortened and  externally rotated.  Attempts of gentle logroll right hip causes pain.  Left hip good range of motion of the left hip without pain.  Bilateral lower extremities without obvious deformities.  Calves are supple and nontender bilaterally.  Assessment/Plan: 79 year old female with dementia status post mechanical fall.  Right hip intertrochanteric fracture  We will need to contact family to discuss possible surgical intervention with she would be intramedullary nailing of the right hip fracture.  This would be for comfort and mobilization.  Dr. Ninfa Linden has attempted to call the numbers available in the chart .We will continue to call the sister and the son numbers listed on the chart to determine their wishes in regards to whether or not to proceed with the surgical intervention.  Strict bed rest. Non weight bearing right lower extremity.  Rasa Degrazia 01/30/2020, 4:34 PM

## 2020-01-30 NOTE — ED Provider Notes (Signed)
[facility] DEPT Provider Note   CSN: [id number] Arrival date & time: 01/30/20  1047     History Chief Complaint  Patient presents with  . Fall  . Hip Pain    Olivia Phillips is a 79 y.o. female with a history of COPD, hyperlipidemia, GERD, and CKD who presents to the emergency department via EMS status post mechanical falls in the last 24 hours.  Per EMS patient fell out of bed yesterday, had not been ambulatory since, today her daughter tried to help her out of a chair and she again slid down to the ground as she could not walk.  Patient states that she rolled out of bed yesterday by accident, she does not believe she hit her head or had loss of consciousness with either of her falls.  She denies any pain currently.  She does have a history of dementia complicating history, level 5 caveat applies.  HPI     Past Medical History:  Diagnosis Date  . Chronic low back pain   . Cluster headache   . COPD (chronic obstructive pulmonary disease) ([place])   . CRF (chronic renal failure)   . GERD (gastroesophageal reflux disease)   . Hyperlipidemia   . Syncopal episodes   . Tobacco use     Patient Active Problem List   Diagnosis Date Noted  . Hyperlipidemia 03/15/2016  . Tobacco abuse 03/15/2016  . Syncope 03/15/2016    History reviewed. No pertinent surgical history.   OB History   No obstetric history on file.     Family History  Problem Relation Age of Onset  . Alzheimer's disease Mother   . Lung cancer Son     Social History   Tobacco Use  . Smoking status: Former Research scientist (life sciences)  . Smokeless tobacco: Never Used  Substance Use Topics  . Alcohol use: No  . Drug use: No    Home Medications Prior to Admission medications   Medication Sig Start Date End Date Taking? Authorizing Provider  acetaminophen (TYLENOL) 500 MG tablet Take 500 mg by mouth every 6 (six) hours as needed.    [provider]  Multiple Vitamin (MULTIVITAMIN WITH  MINERALS) TABS tablet Take 1 tablet by mouth daily.    [provider]    Allergies    Codeine, Alendronate sodium, and Ibuprofen  Review of Systems   Review of Systems  Unable to perform ROS: Dementia    Physical Exam Updated Vital Signs BP (!) 175/82 (BP Location: Right Arm)   Pulse 74   Temp 98.3 F (36.8 C) (Oral)   Resp (!) 29   Ht 5' (1.524 m)   Wt 41 kg   SpO2 100%   BMI 17.65 kg/m   Physical Exam Vitals and nursing note reviewed.  Constitutional:      General: She is not in acute distress.    Comments: Thin appearing.  HENT:     Head: Normocephalic and atraumatic.     Comments: No raccoon eyes or battle sign.    Nose: Nose normal.  Eyes:     Extraocular Movements: Extraocular movements intact.     Pupils: Pupils are equal, round, and reactive to light.  Neck:     Comments: No midline spinal tenderness. Cardiovascular:     Rate and Rhythm: Normal rate and regular rhythm.     Comments: 2+ symmetric DP pulses. Pulmonary:     Effort: Pulmonary effort is normal.     Breath sounds: Normal breath  sounds.  Chest:     Chest wall: No tenderness.  Abdominal:     Palpations: Abdomen is soft.     Tenderness: There is no abdominal tenderness. There is no guarding or rebound.  Musculoskeletal:     Cervical back: Normal range of motion and neck supple. No tenderness.     Comments: Upper extremities: No focal bony tenderness Back: No midline tenderness or palpable step-off Lower extremities: Right lower extremity is externally rotated and shortened with obvious deformity.  She is tender to palpation to the right anterior lateral hip.  Otherwise no significant tenderness to palpation.  Skin:    General: Skin is dry.  Neurological:     Mental Status: She is alert.     Comments: Alert.  Clear speech.  Sensation grossly intact bilateral upper and lower extremities.  Able to move all digits in the lower extremities.  Difficulty following commands to assess  plantar/dorsi flexion strength.    ED Results / Procedures / Treatments   Labs (all labs ordered are listed, but only abnormal results are displayed) Labs Reviewed  SARS CORONAVIRUS 2 BY RT PCR (HOSPITAL ORDER, [facility])  BASIC METABOLIC PANEL  CBC WITH DIFFERENTIAL/PLATELET  PROTIME-INR  TYPE AND SCREEN    EKG None  Radiology DG Pelvis 1-2 Views  Result Date: 01/30/2020 CLINICAL DATA:  Fall, hip pain EXAM: PELVIS - 1-2 VIEW; RIGHT FEMUR 2 VIEWS COMPARISON:  None. FINDINGS: Osteopenia. There is a slightly impacted and foreshortened inter trochanteric fracture of the proximal right femur. No obvious fracture of the pelvis or proximal left femur seen in single frontal view. No fracture or dislocation of the distal right femur. IMPRESSION: 1. There is a slightly impacted and foreshortened intertrochanteric fracture of the proximal right femur. 2.  No fracture or dislocation of the distal right femur. Electronically Signed   By: Eddie Candle M.D.   On: 01/30/2020 13:15   DG Femur Min 2 Views Right  Result Date: 01/30/2020 CLINICAL DATA:  Fall, hip pain EXAM: PELVIS - 1-2 VIEW; RIGHT FEMUR 2 VIEWS COMPARISON:  None. FINDINGS: Osteopenia. There is a slightly impacted and foreshortened inter trochanteric fracture of the proximal right femur. No obvious fracture of the pelvis or proximal left femur seen in single frontal view. No fracture or dislocation of the distal right femur. IMPRESSION: 1. There is a slightly impacted and foreshortened intertrochanteric fracture of the proximal right femur. 2.  No fracture or dislocation of the distal right femur. Electronically Signed   By: Eddie Candle M.D.   On: 01/30/2020 13:15    Procedures Procedures (including critical care time)  Medications Ordered in ED Medications  fentaNYL (SUBLIMAZE) injection 50 mcg (has no administration in time range)    ED Course  I have reviewed the triage vital signs and the nursing  notes.  Pertinent labs & imaging results that were available during my care of the patient were reviewed by me and considered in my medical decision making (see chart for details).    Olivia Phillips was evaluated in Emergency Department on 01/30/2020 for the symptoms described in the history of present illness. He/she was evaluated in the context of the global COVID-19 pandemic, which necessitated consideration that the patient might be at risk for infection with the SARS-CoV-2 virus that causes COVID-19. Institutional protocols and algorithms that pertain to the evaluation of patients at risk for COVID-19 are in a state of rapid change based on information released by regulatory bodies  including the CDC and federal and state organizations. These policies and algorithms were followed during the patient's care in the ED.  MDM Rules/Calculators/A&P                         Patient presents to the ED for evaluation status post fall. On exam she has shortening and external rotation of her right lower extremity-MVI distally.  No other obvious signs of injury.  Additional history obtained:  Additional history obtained from EMS. Previous records obtained and reviewed.  Patient daughter arrived at bedside shortly following EMS, she confirms that patient has not hit her head.  Lab Tests:  I Ordered, reviewed, and interpreted labs, which included:  CBC: Leukocytosis felt to be nonspecific BMP: Mild hyperglycemia without acidosis or anion gap elevation. PT/INR: Within normal limits Covid testing: Negative  Imaging Studies ordered:  I ordered imaging studies which included femur/pelvic x-rays, I independently visualized and interpreted imaging which showed there is a slightly impacted and foreshortened intertrochanteric fracture of the proximal right femur.  Patient and her daughter at bedside updated on results and plan of care.  Patient with pain well-controlled at time of reevaluation.  13:27:  CONSULT: Discussed with orthopedic surgeon Dr. Ninfa Linden, will see patient in consultation.  13:52: CONSULT: Discussed with hospitalist Dr. Wyline Copas- accepts admission.   Findings and plan of care discussed with supervising physician Dr. Sherry Ruffing who has evaluated patient and is in agreement with plan. .   Portions of this note were generated with Lobbyist. Dictation errors may occur despite best attempts at proofreading.  Final Clinical Impression(s) / ED Diagnoses Final diagnoses:  Fall, initial encounter  Closed displaced intertrochanteric fracture of right femur, initial encounter [facility])    Rx / DC Orders ED Discharge Orders    None       Amaryllis Dyke, PA-C 01/30/20 1352    Tegeler, Gwenyth Allegra, MD 01/30/20 [phone]

## 2020-01-30 NOTE — ED Triage Notes (Signed)
Arrives via EMS from home, C/C fall yesterday. Was unable to get pt out of the recliner today D/T leg and hip pain, so they called EMS.  Per family, no complaints yesterday, hx of dementia. Shortening of R leg noted per EMS. Pedal pulses intact.

## 2020-01-30 NOTE — H&P (Signed)
History and Physical    Olivia Phillips FXT:[id number] DOB: 04-26-1941 DOA: 01/30/2020  PCP: Deland Pretty, MD  Patient coming from: Home  Chief Complaint: Fall, R hip pain  HPI: Olivia Phillips is a 79 y.o. female with medical history significant of dementia, GERD, hyperlipidemia who presents to the emergency department after mechanical fall 1 day prior to hospital visit.  Sent has since been mainly sedentary since and unable to walk. History unobtainable from patient herself given her advanced dementia  ED Course: In the emergency department, patient underwent imaging which confirmed a right femoral fracture.  Patient was found to be afebrile.  Patient did have leukocytosis of in excess of 14,000.  Therapeutic surgery was consulted who recommended medical admission.  Hospitalist service consulted for consideration for admission  Review of Systems:  Review of Systems  Unable to perform ROS: Dementia    Past Medical History:  Diagnosis Date  . Chronic low back pain   . Cluster headache   . COPD (chronic obstructive pulmonary disease) ([place])   . CRF (chronic renal failure)   . GERD (gastroesophageal reflux disease)   . Hyperlipidemia   . Syncopal episodes   . Tobacco use     History reviewed. No pertinent surgical history.   reports that she has quit smoking. She has never used smokeless tobacco. She reports that she does not drink alcohol and does not use drugs.  Allergies  Allergen Reactions  . Codeine   . Alendronate Sodium Nausea And Vomiting  . Ibuprofen Other (See Comments)    Muscle spasms     Family History  Problem Relation Age of Onset  . Alzheimer's disease Mother   . Lung cancer Son     Prior to Admission medications   Medication Sig Start Date End Date Taking? Authorizing Provider  acetaminophen (TYLENOL) 500 MG tablet Take 500 mg by mouth every 6 (six) hours as needed.   Yes [provider]  Calcium Carbonate-Vitamin D (CALCIUM 500+D PO) Take 1  tablet by mouth daily.   Yes [provider]  Multiple Vitamin (MULTIVITAMIN WITH MINERALS) TABS tablet Take 1 tablet by mouth daily.   Yes [provider]    Physical Exam: Vitals:   01/30/20 1104 01/30/20 1130 01/30/20 1230 01/30/20 1537  BP: (!) 175/82 (!) 158/83 (!) 149/80 (!) 149/91  Pulse: 74 75 76 93  Resp: (!) 29 18 16  (!) 23  Temp: 98.3 F (36.8 C)     TempSrc: Oral     SpO2: 100% 98% 98% 98%  Weight:      Height:        Constitutional: NAD, calm, comfortable Vitals:   01/30/20 1104 01/30/20 1130 01/30/20 1230 01/30/20 1537  BP: (!) 175/82 (!) 158/83 (!) 149/80 (!) 149/91  Pulse: 74 75 76 93  Resp: (!) 29 18 16  (!) 23  Temp: 98.3 F (36.8 C)     TempSrc: Oral     SpO2: 100% 98% 98% 98%  Weight:      Height:       Eyes: PERRL, lids and conjunctivae normal ENMT: Mucous membranes are moist. Posterior pharynx clear of any exudate or lesions.Normal dentition.  Neck: normal, supple, no masses, no thyromegaly Respiratory: clear to auscultation bilaterally, no wheezing, normal resp effort Cardiovascular: Regular rate and rhythm, perfused  Abdomen: no tenderness, no masses palpated. No hepatosplenomegaly. Bowel sounds positive.  Musculoskeletal: no clubbing / cyanosis. No joint deformity upper and lower extremities. Good ROM, no contractures. Normal muscle  tone.  Skin: no rashes, lesions. No induration Neurologic: CN 2-12 grossly intact. Sensation intact, no tremors. Strength 5/5 in all 4.  Psychiatric: Unable to assess given patient's advanced dementia   Labs on Admission: I have personally reviewed following labs and imaging studies  CBC: Recent Labs  Lab 01/30/20 1117  WBC 14.1*  NEUTROABS 11.5*  HGB 14.4  HCT 44.9  MCV 92.8  PLT 222   Basic Metabolic Panel: Recent Labs  Lab 01/30/20 1117  NA 138  K 4.2  CL 102  CO2 25  GLUCOSE 142*  BUN 23  CREATININE 0.80  CALCIUM 8.8*   GFR: Estimated Creatinine Clearance: 36.3 mL/min (by  C-G formula based on SCr of 0.8 mg/dL). Liver Function Tests: No results for input(s): AST, ALT, ALKPHOS, BILITOT, PROT, ALBUMIN in the last 168 hours. No results for input(s): LIPASE, AMYLASE in the last 168 hours. No results for input(s): AMMONIA in the last 168 hours. Coagulation Profile: Recent Labs  Lab 01/30/20 1117  INR 1.0   Cardiac Enzymes: No results for input(s): CKTOTAL, CKMB, CKMBINDEX, TROPONINI in the last 168 hours. BNP (last 3 results) No results for input(s): PROBNP in the last 8760 hours. HbA1C: No results for input(s): HGBA1C in the last 72 hours. CBG: No results for input(s): GLUCAP in the last 168 hours. Lipid Profile: No results for input(s): CHOL, HDL, LDLCALC, TRIG, CHOLHDL, LDLDIRECT in the last 72 hours. Thyroid Function Tests: No results for input(s): TSH, T4TOTAL, FREET4, T3FREE, THYROIDAB in the last 72 hours. Anemia Panel: No results for input(s): VITAMINB12, FOLATE, FERRITIN, TIBC, IRON, RETICCTPCT in the last 72 hours. Urine analysis: No results found for: COLORURINE, APPEARANCEUR, LABSPEC, PHURINE, GLUCOSEU, HGBUR, BILIRUBINUR, KETONESUR, PROTEINUR, UROBILINOGEN, NITRITE, LEUKOCYTESUR Sepsis Labs: !!!!!!!!!!!!!!!!!!!!!!!!!!!!!!!!!!!!!!!!!!!! @LABRCNTIP (procalcitonin:4,lacticidven:4) ) Recent Results (from the past 240 hour(s))  SARS Coronavirus 2 by RT PCR (hospital order, performed in [facility] hospital lab) Nasopharyngeal Nasopharyngeal Swab     Status: None   Collection Time: 01/30/20 11:17 AM   Specimen: Nasopharyngeal Swab  Result Value Ref Range Status   SARS Coronavirus 2 NEGATIVE NEGATIVE Final    Comment: (NOTE) SARS-CoV-2 target nucleic acids are NOT DETECTED.  The SARS-CoV-2 RNA is generally detectable in upper and lower respiratory specimens during the acute phase of infection. The lowest concentration of SARS-CoV-2 viral copies this assay can detect is 250 copies / mL. A negative result does not preclude SARS-CoV-2  infection and should not be used as the sole basis for treatment or other patient management decisions.  A negative result may occur with improper specimen collection / handling, submission of specimen other than nasopharyngeal swab, presence of viral mutation(s) within the areas targeted by this assay, and inadequate number of viral copies (<250 copies / mL). A negative result must be combined with clinical observations, patient history, and epidemiological information.  Fact Sheet for Patients:   StrictlyIdeas.no  Fact Sheet for Healthcare Providers: BankingDealers.co.za  This test is not yet approved or  cleared by the [place] FDA and has been authorized for detection and/or diagnosis of SARS-CoV-2 by FDA under an Emergency Use Authorization (EUA).  This EUA will remain in effect (meaning this test can be used) for the duration of the COVID-19 declaration under Section 564(b)(1) of the Act, 21 U.S.C. section 360bbb-3(b)(1), unless the authorization is terminated or revoked sooner.  Performed at [facility], [place] [address], [place], [place] [address]      Radiological Exams on Admission: DG Pelvis 1-2 Views  Result Date:  01/30/2020 CLINICAL DATA:  Fall, hip pain EXAM: PELVIS - 1-2 VIEW; RIGHT FEMUR 2 VIEWS COMPARISON:  None. FINDINGS: Osteopenia. There is a slightly impacted and foreshortened inter trochanteric fracture of the proximal right femur. No obvious fracture of the pelvis or proximal left femur seen in single frontal view. No fracture or dislocation of the distal right femur. IMPRESSION: 1. There is a slightly impacted and foreshortened intertrochanteric fracture of the proximal right femur. 2.  No fracture or dislocation of the distal right femur. Electronically Signed   By: Eddie Candle M.D.   On: 01/30/2020 13:15   DG Femur Min 2 Views Right  Result Date: 01/30/2020 CLINICAL DATA:  Fall, hip pain  EXAM: PELVIS - 1-2 VIEW; RIGHT FEMUR 2 VIEWS COMPARISON:  None. FINDINGS: Osteopenia. There is a slightly impacted and foreshortened inter trochanteric fracture of the proximal right femur. No obvious fracture of the pelvis or proximal left femur seen in single frontal view. No fracture or dislocation of the distal right femur. IMPRESSION: 1. There is a slightly impacted and foreshortened intertrochanteric fracture of the proximal right femur. 2.  No fracture or dislocation of the distal right femur. Electronically Signed   By: Eddie Candle M.D.   On: 01/30/2020 13:15    EKG: Independently reviewed. Sinus [id number]  Assessment/Plan Principal Problem:   Right femoral fracture (HCC) Active Problems:   Hyperlipidemia   Leukocytosis   Elevated blood pressure reading   DNR (do not resuscitate)   1. Right femoral fracture status post mechanical fall 1. Imaging reviewed, patient with acute right femoral fracture 2. EDP did discuss with on-call orthopedic surgeon who will see in consultation 3. For now, we will continue n.p.o. 4. We will check chest x-ray for preop clearance.  If chest x-ray clear, benefits to surgery would outweigh perioperative medical risk 5. Repeat CBC and basic metabolic panel in the morning 2. Hyperlipidemia 1. Appears to be stable at this time 3. Leukocytosis 1. Likely secondary to acute fracture 2. Patient afebrile 3. Repeat CBC in the morning 4. Elevated blood pressure 1. Patient not on blood pressure medications prior to admission 2. Suspect component of pain 3. Continue with analgesics as needed 5. History of dementia 1. Seems to be stable at this time 6. Code status 1. Confirmed DNR status  DVT prophylaxis: SCD's  Code Status: DNR, confirmed with family at bedside Family Communication: Pt in room  Disposition Plan:   Consults called: Orthopedic Surgery Admission status: Inpatient as it will likely require greater than 2 midnight stay to treat acute femoral  fracture  Marylu Lund MD [facility] Pager On Amion  If 7PM-7AM, please contact night-coverage  01/30/2020, 3:37 PM

## 2020-01-31 ENCOUNTER — Inpatient Hospital Stay (HOSPITAL_COMMUNITY): Payer: Medicare HMO

## 2020-01-31 ENCOUNTER — Inpatient Hospital Stay (HOSPITAL_COMMUNITY): Payer: Medicare HMO | Admitting: Certified Registered Nurse Anesthetist

## 2020-01-31 ENCOUNTER — Encounter (HOSPITAL_COMMUNITY)
Admission: EM | Disposition: A | Discharge status: Skilled Nursing Facility | Payer: Self-pay | Source: Home / Self Care | Attending: Internal Medicine

## 2020-01-31 HISTORY — PX: INTRAMEDULLARY (IM) NAIL INTERTROCHANTERIC: SHX5875

## 2020-01-31 LAB — GLUCOSE, CAPILLARY
Glucose-Capillary: 103 mg/dL — ABNORMAL HIGH (ref 70–99)
Glucose-Capillary: 120 mg/dL — ABNORMAL HIGH (ref 70–99)
Glucose-Capillary: 185 mg/dL — ABNORMAL HIGH (ref 70–99)

## 2020-01-31 SURGERY — FIXATION, FRACTURE, INTERTROCHANTERIC, WITH INTRAMEDULLARY ROD
Anesthesia: General | Site: Hip | Laterality: Right

## 2020-01-31 MED ORDER — METHOCARBAMOL 500 MG IVPB - SIMPLE MED
500.0000 mg | Freq: Four times a day (QID) | INTRAVENOUS | Status: DC | PRN
  Filled 2020-01-31 (×2): qty 50

## 2020-01-31 MED ORDER — PHENYLEPHRINE 40 MCG/ML (10ML) SYRINGE FOR IV PUSH (FOR BLOOD PRESSURE SUPPORT)
PREFILLED_SYRINGE | INTRAVENOUS | Status: DC | PRN
  Administered 2020-01-31 (×2): 80 ug via INTRAVENOUS

## 2020-01-31 MED ORDER — CHLORHEXIDINE GLUCONATE CLOTH 2 % EX PADS
6.0000 | MEDICATED_PAD | Freq: Every day | CUTANEOUS | Status: DC
  Administered 2020-02-01 – 2020-02-02 (×2): 6 via TOPICAL

## 2020-01-31 MED ORDER — DEXAMETHASONE SODIUM PHOSPHATE 10 MG/ML IJ SOLN
INTRAMUSCULAR | Status: AC
  Filled 2020-01-31: qty 1

## 2020-01-31 MED ORDER — TRAMADOL HCL 50 MG PO TABS
50.0000 mg | ORAL_TABLET | Freq: Four times a day (QID) | ORAL | Status: DC | PRN
  Administered 2020-02-01: 50 mg via ORAL
  Filled 2020-01-31: qty 1

## 2020-01-31 MED ORDER — PHENYLEPHRINE 40 MCG/ML (10ML) SYRINGE FOR IV PUSH (FOR BLOOD PRESSURE SUPPORT)
PREFILLED_SYRINGE | INTRAVENOUS | Status: AC
  Filled 2020-01-31: qty 10

## 2020-01-31 MED ORDER — METOCLOPRAMIDE HCL 5 MG PO TABS: 5.0000 mg | ORAL_TABLET | Freq: Three times a day (TID) | ORAL | Status: DC | PRN

## 2020-01-31 MED ORDER — DOCUSATE SODIUM 100 MG PO CAPS: 100.0000 mg | ORAL_CAPSULE | Freq: Two times a day (BID) | ORAL | Status: DC

## 2020-01-31 MED ORDER — ONDANSETRON HCL 4 MG PO TABS: 4.0000 mg | ORAL_TABLET | Freq: Four times a day (QID) | ORAL | Status: DC | PRN

## 2020-01-31 MED ORDER — ACETAMINOPHEN 325 MG PO TABS
650.0000 mg | ORAL_TABLET | Freq: Four times a day (QID) | ORAL | Status: DC | PRN
  Administered 2020-01-31 – 2020-02-02 (×2): 650 mg via ORAL
  Filled 2020-01-31 (×2): qty 2

## 2020-01-31 MED ORDER — PROPOFOL 500 MG/50ML IV EMUL
INTRAVENOUS | Status: DC | PRN
  Administered 2020-01-31: 120 mg via INTRAVENOUS

## 2020-01-31 MED ORDER — METHOCARBAMOL 500 MG PO TABS
500.0000 mg | ORAL_TABLET | Freq: Four times a day (QID) | ORAL | Status: DC | PRN
  Administered 2020-01-31 – 2020-02-01 (×2): 500 mg via ORAL
  Filled 2020-01-31 (×2): qty 1

## 2020-01-31 MED ORDER — EPHEDRINE SULFATE-NACL 50-0.9 MG/10ML-% IV SOSY
PREFILLED_SYRINGE | INTRAVENOUS | Status: DC | PRN
  Administered 2020-01-31 (×2): 10 mg via INTRAVENOUS

## 2020-01-31 MED ORDER — LACTATED RINGERS IV SOLN: INTRAVENOUS | Status: DC | PRN

## 2020-01-31 MED ORDER — LIDOCAINE 2% (20 MG/ML) 5 ML SYRINGE
INTRAMUSCULAR | Status: AC
  Filled 2020-01-31: qty 5

## 2020-01-31 MED ORDER — 0.9 % SODIUM CHLORIDE (POUR BTL) OPTIME
TOPICAL | Status: DC | PRN
  Administered 2020-01-31: 1000 mL

## 2020-01-31 MED ORDER — PROPOFOL 10 MG/ML IV BOLUS
INTRAVENOUS | Status: AC
  Filled 2020-01-31: qty 20

## 2020-01-31 MED ORDER — DEXAMETHASONE SODIUM PHOSPHATE 10 MG/ML IJ SOLN
INTRAMUSCULAR | Status: DC | PRN
  Administered 2020-01-31: 10 mg via INTRAVENOUS

## 2020-01-31 MED ORDER — MENTHOL 3 MG MT LOZG: 1.0000 | LOZENGE | OROMUCOSAL | Status: DC | PRN

## 2020-01-31 MED ORDER — ONDANSETRON HCL 4 MG/2ML IJ SOLN
INTRAMUSCULAR | Status: DC | PRN
  Administered 2020-01-31: 4 mg via INTRAVENOUS

## 2020-01-31 MED ORDER — EPHEDRINE 5 MG/ML INJ
INTRAVENOUS | Status: AC
  Filled 2020-01-31: qty 10

## 2020-01-31 MED ORDER — OXYCODONE HCL 5 MG/5ML PO SOLN: 5.0000 mg | Freq: Once | ORAL | Status: DC | PRN

## 2020-01-31 MED ORDER — LIP MEDEX EX OINT
TOPICAL_OINTMENT | CUTANEOUS | Status: AC
  Administered 2020-01-31: 1
  Filled 2020-01-31: qty 7

## 2020-01-31 MED ORDER — METOCLOPRAMIDE HCL 5 MG/ML IJ SOLN: 5.0000 mg | Freq: Three times a day (TID) | INTRAMUSCULAR | Status: DC | PRN

## 2020-01-31 MED ORDER — CEFAZOLIN SODIUM-DEXTROSE 2-4 GM/100ML-% IV SOLN
INTRAVENOUS | Status: AC
  Administered 2020-01-31: 2 g via INTRAVENOUS
  Filled 2020-01-31: qty 100

## 2020-01-31 MED ORDER — OXYCODONE HCL 5 MG PO TABS: 5.0000 mg | ORAL_TABLET | Freq: Once | ORAL | Status: DC | PRN

## 2020-01-31 MED ORDER — HYDROMORPHONE HCL 1 MG/ML IJ SOLN
0.2500 mg | INTRAMUSCULAR | Status: DC | PRN
  Administered 2020-01-31: 0.25 mg via INTRAVENOUS

## 2020-01-31 MED ORDER — PROMETHAZINE HCL 25 MG/ML IJ SOLN: 6.2500 mg | INTRAMUSCULAR | Status: DC | PRN

## 2020-01-31 MED ORDER — ONDANSETRON HCL 4 MG/2ML IJ SOLN
INTRAMUSCULAR | Status: AC
  Filled 2020-01-31: qty 2

## 2020-01-31 MED ORDER — ONDANSETRON HCL 4 MG/2ML IJ SOLN: 4.0000 mg | Freq: Four times a day (QID) | INTRAMUSCULAR | Status: DC | PRN

## 2020-01-31 MED ORDER — CEFAZOLIN SODIUM-DEXTROSE 2-3 GM-%(50ML) IV SOLR
INTRAVENOUS | Status: DC | PRN
  Administered 2020-01-31: 2 g via INTRAVENOUS

## 2020-01-31 MED ORDER — ASPIRIN EC 325 MG PO TBEC
325.0000 mg | DELAYED_RELEASE_TABLET | Freq: Every day | ORAL | Status: DC
  Administered 2020-02-01 – 2020-02-02 (×2): 325 mg via ORAL
  Filled 2020-01-31 (×2): qty 1

## 2020-01-31 MED ORDER — FENTANYL CITRATE (PF) 100 MCG/2ML IJ SOLN
INTRAMUSCULAR | Status: DC | PRN
Start: 2020-01-31 — End: 2020-01-31
  Administered 2020-01-31 (×3): 25 ug via INTRAVENOUS
  Administered 2020-01-31: 50 ug via INTRAVENOUS

## 2020-01-31 MED ORDER — HYDROMORPHONE HCL 1 MG/ML IJ SOLN
INTRAMUSCULAR | Status: AC
  Filled 2020-01-31: qty 1

## 2020-01-31 MED ORDER — AMISULPRIDE (ANTIEMETIC) 5 MG/2ML IV SOLN: 10.0000 mg | Freq: Once | INTRAVENOUS | Status: DC | PRN

## 2020-01-31 MED ORDER — CEFAZOLIN SODIUM-DEXTROSE 2-4 GM/100ML-% IV SOLN
2.0000 g | Freq: Four times a day (QID) | INTRAVENOUS | Status: AC
  Administered 2020-01-31: 2 g via INTRAVENOUS
  Filled 2020-01-31 (×2): qty 100

## 2020-01-31 MED ORDER — LIDOCAINE 2% (20 MG/ML) 5 ML SYRINGE
INTRAMUSCULAR | Status: DC | PRN
  Administered 2020-01-31: 40 mg via INTRAVENOUS

## 2020-01-31 MED ORDER — MUPIROCIN 2 % EX OINT
1.0000 "application " | TOPICAL_OINTMENT | Freq: Two times a day (BID) | CUTANEOUS | Status: DC
  Administered 2020-01-31 – 2020-02-02 (×6): 1 via NASAL
  Filled 2020-01-31: qty 22

## 2020-01-31 MED ORDER — PHENOL 1.4 % MT LIQD: 1.0000 | OROMUCOSAL | Status: DC | PRN

## 2020-01-31 MED ORDER — FENTANYL CITRATE (PF) 100 MCG/2ML IJ SOLN
INTRAMUSCULAR | Status: AC
  Filled 2020-01-31: qty 2

## 2020-01-31 SURGICAL SUPPLY — 37 items
BIT DRILL COMPR SURG QC 11 4H (DRILL) ×1 IMPLANT
BNDG GAUZE ELAST 4 BULKY (GAUZE/BANDAGES/DRESSINGS) ×3 IMPLANT
COVER PERINEAL POST (MISCELLANEOUS) ×3 IMPLANT
COVER SURGICAL LIGHT HANDLE (MISCELLANEOUS) ×3 IMPLANT
COVER WAND RF STERILE (DRAPES) IMPLANT
DRAPE STERI IOBAN 125X83 (DRAPES) ×3 IMPLANT
DRILL COMPR SURG QC 11 4H (DRILL) ×3
DRSG MEPILEX BORDER 4X4 (GAUZE/BANDAGES/DRESSINGS) ×6 IMPLANT
DRSG MEPILEX BORDER 4X8 (GAUZE/BANDAGES/DRESSINGS) IMPLANT
DURAPREP 26ML APPLICATOR (WOUND CARE) ×3 IMPLANT
ELECT REM PT RETURN 15FT ADLT (MISCELLANEOUS) ×3 IMPLANT
FACESHIELD WRAPAROUND (MASK) ×3 IMPLANT
GAUZE XEROFORM 5X9 LF (GAUZE/BANDAGES/DRESSINGS) ×3 IMPLANT
GLOVE BIO SURGEON STRL SZ7.5 (GLOVE) ×3 IMPLANT
GLOVE BIOGEL PI IND STRL 8 (GLOVE) ×1 IMPLANT
GLOVE BIOGEL PI INDICATOR 8 (GLOVE) ×2
GLOVE ECLIPSE 8.0 STRL XLNG CF (GLOVE) IMPLANT
GOWN STRL REUS W/TWL XL LVL3 (GOWN DISPOSABLE) ×3 IMPLANT
GUIDE PIN 3.2X343 (PIN) ×2
GUIDE PIN 3.2X343MM (PIN) ×6
KIT BASIN OR (CUSTOM PROCEDURE TRAY) ×3 IMPLANT
KIT TURNOVER KIT A (KITS) IMPLANT
MANIFOLD NEPTUNE II (INSTRUMENTS) ×3 IMPLANT
NAIL TRIGEN RT 10MMX36CM-125 (Nail) ×3 IMPLANT
NS IRRIG 1000ML POUR BTL (IV SOLUTION) ×3 IMPLANT
PACK GENERAL/GYN (CUSTOM PROCEDURE TRAY) ×3 IMPLANT
PENCIL SMOKE EVACUATOR (MISCELLANEOUS) IMPLANT
PIN GUIDE 3.2X343MM (PIN) ×2 IMPLANT
PROTECTOR NERVE ULNAR (MISCELLANEOUS) ×3 IMPLANT
SCREW LAG COMPR KIT 85/80 (Screw) ×3 IMPLANT
SCREW LAG COMPR KIT 90/85 (Screw) ×3 IMPLANT
STAPLER VISISTAT 35W (STAPLE) ×3 IMPLANT
SUT VIC AB 0 CT1 36 (SUTURE) ×3 IMPLANT
SUT VIC AB 1 CT1 36 (SUTURE) ×3 IMPLANT
SUT VIC AB 2-0 CT1 27 (SUTURE) ×3
SUT VIC AB 2-0 CT1 TAPERPNT 27 (SUTURE) ×1 IMPLANT
TOWEL OR 17X26 10 PK STRL BLUE (TOWEL DISPOSABLE) ×3 IMPLANT

## 2020-01-31 NOTE — Op Note (Signed)
NAME: Olivia Phillips, PRELL MEDICAL RECORD [id number] ACCOUNT [id number] DATE OF BIRTH:1941/03/08 FACILITY: [facility] LOCATION: [id number] PHYSICIAN:Keeley Sussman [place], MD  OPERATIVE REPORT  DATE OF PROCEDURE:  01/31/2020  PREOPERATIVE DIAGNOSIS:  Right hip displaced intertrochanteric/low basicervical proximal femur fracture.  POSTOPERATIVE DIAGNOSIS:  Right hip displaced intertrochanteric/low basicervical proximal femur fracture Right knee.  PROCEDURE:  Open reduction force/internal fixation of right complex hip fracture using intramedullary rod and hip screws.  IMPLANTS:  Tamala [employer] InterTAN 10 x 340 femoral nail with an interdigitating 94/85 lag compression screw construct.  SURGEON:  Lind Guest. Ninfa Linden, MD  ASSISTANT:  Erskine Emery, PA-C  ANESTHESIA:  General.  ANTIBIOTICS:  Two g IV Ancef.  ESTIMATED BLOOD LOSS:  50 mL.  COMPLICATIONS:  None.  INDICATIONS:  The patient is a pleasant 79 year old female who is actually pleasantly demented as well.  She does ambulate and exhibits signs of pain.  She lives with her son and sustained a mechanical fall yesterday, injuring her right hip.  She was  transported to the [facility].  She was found to have a displaced high intertrochanteric fracture.  I have talked to her sister at length in detail and we have recommended surgery and she wishes [place] to proceed with this as well given the  fact that her sister does ambulate and is exhibiting significant pain.  She understands this is for quality of life and pain control purposes and will give her a better chance at getting back to some type of mobility, as well as controlling her pain.  We  had a long and thorough discussion about the risks and benefits of surgery and she is presenting for surgery today.  DESCRIPTION OF PROCEDURE:  After informed consent was obtained, the appropriate right hip was marked.  She was brought to the operating room where general anesthesia  was obtained while she was on a stretcher.  She was then placed supine on the fracture  table with a perineal post in place and the right operative leg in in-line skeletal traction and the left hip in a leg holder, flexed and abducted,  protected the popliteal area.  We then assessed the fracture under direct fluoroscopy and with traction  and slight internal rotation, we were able to get the fracture near anatomically aligned.  We then kept the rod sterilely in the box so we could measure our rod under fluoroscopy and we chose a 10 x 340 femoral nail.  This was then passed off sterilely  to the back table.  We prepped the right hip with DuraPrep and sterile drapes.  A time-out was called.  She was identified as correct patient, correct right hip.  I then made an incision just proximal to the greater trochanter and dissected down to the  tip of the greater trochanter.  Under direct fluoroscopy, a temporary pin was placed and then we used an initiating reamer to open up the femoral canal.  We removed the guide pin and we did not need to ream and placed the 10 x 340 right femoral nail down  the canal without difficulty.  Using the outrigger guide, we then made a separate lateral incision and were able to place a temporary pin from the lateral cortex of the femur, traversing the fracture and into a good position of the femoral head and  neck, slightly inferior and slightly posterior for better bone quality.  We took a measurement off of this and we chose a 90 lag screw with an  85 compression screw, which would assist this as interdigitating.  We then drilled for my depth of both of  those and placed the lag screw and compression screw without difficulty and let some traction off the bed as we were placing our final compression and it did reduce the fracture anatomically.  We then removed the outrigger guide and put the hip through  internal and external rotation under direct fluoroscopy and it moved as a unit.   We irrigated the wound sites with normal saline solution and closed the deep tissue with 0 Vicryl, followed by 2-0 Vicryl to close subcutaneous tissue and interrupted  staples on both skin incisions.  She was then taken off the fracture table, awakened, extubated, and taken to the recovery room in stable condition with all final counts being correct.  No complications noted.  Sterile dressing was applied.  Of note, Benita Stabile, PA-C, assisted during the entire case.  His assistance was crucial for facilitating all aspects of this case.  VN/NUANCE  D:01/31/2020 T:01/31/2020 JOB:[id number]

## 2020-01-31 NOTE — Brief Op Note (Signed)
01/30/2020 - 01/31/2020  8:36 AM  PATIENT:  Olivia Phillips  79 y.o. female  PRE-OPERATIVE DIAGNOSIS:  RIGHT HIP INTERTROCHANTERIC FRACTURE  POST-OPERATIVE DIAGNOSIS:  right hip intertrochanteric fracture  PROCEDURE:  Procedure(s): INTRAMEDULLARY (IM) NAIL INTERTROCHANTRIC (Right)  SURGEON:  Surgeon(s) and Role:    Mcarthur Rossetti, MD - Primary  PHYSICIAN ASSISTANT:  Benita Stabile, PA-C  ANESTHESIA:   general  EBL:  50 mL   COUNTS:  YES  DICTATION: .Other Dictation: Dictation Number [id number]  PATIENT DISPOSITION:  PACU - hemodynamically stable.   Delay start of Pharmacological VTE agent (>24hrs) due to surgical blood loss or risk of bleeding: yes

## 2020-01-31 NOTE — Anesthesia Preprocedure Evaluation (Signed)
Anesthesia Evaluation  Patient identified by MRN, date of birth, ID band Patient awake    Reviewed: Allergy & Precautions, NPO status , Patient's Chart, lab work & pertinent test results  Airway Mallampati: II  TM Distance: >3 FB Neck ROM: Full    Dental no notable dental hx.    Pulmonary COPD, former smoker,    Pulmonary exam normal breath sounds clear to auscultation       Cardiovascular negative cardio ROS Normal cardiovascular exam Rhythm:Regular Rate:Normal     Neuro/Psych  Headaches, Dementia negative psych ROS   GI/Hepatic Neg liver ROS, GERD  ,  Endo/Other  negative endocrine ROS  Renal/GU Renal InsufficiencyRenal disease  negative genitourinary   Musculoskeletal negative musculoskeletal ROS (+)   Abdominal   Peds negative pediatric ROS (+)  Hematology negative hematology ROS (+)   Anesthesia Other Findings   Reproductive/Obstetrics negative OB ROS                             Anesthesia Physical Anesthesia Plan  ASA: III  Anesthesia Plan: General   Post-op Pain Management:    Induction: Intravenous  PONV Risk Score and Plan: 3 and Ondansetron, Dexamethasone, Midazolam and Treatment may vary due to age or medical condition  Airway Management Planned: Oral ETT  Additional Equipment:   Intra-op Plan:   Post-operative Plan: Extubation in OR  Informed Consent: I have reviewed the patients History and Physical, chart, labs and discussed the procedure including the risks, benefits and alternatives for the proposed anesthesia with the patient or authorized representative who has indicated his/her understanding and acceptance.     Dental advisory given  Plan Discussed with: CRNA  Anesthesia Plan Comments:         Anesthesia Quick Evaluation

## 2020-01-31 NOTE — Transfer of Care (Signed)
Immediate Anesthesia Transfer of Care Note  Patient: Olivia Phillips  Procedure(s) Performed: Procedure(s): INTRAMEDULLARY (IM) NAIL INTERTROCHANTRIC (Right)  Patient Location: PACU  Anesthesia Type:General  Level of Consciousness: Alert, Awake, Oriented  Airway & Oxygen Therapy: Patient Spontanous Breathing  Post-op Assessment: Report given to RN  Post vital signs: Reviewed and stable  Last Vitals:  Vitals:   01/31/20 0544 01/31/20 0848  BP: (!) 147/82 140/68  Pulse: 66 79  Resp: 18 10  Temp: 36.7 C (!) (P) 36.3 C  SpO2: 16% 109%    Complications: No apparent anesthesia complications

## 2020-01-31 NOTE — Progress Notes (Signed)
PROGRESS NOTE    JAMEE KEACH  CBJ:[id number] DOB: Jul 05, 1940 DOA: 01/30/2020 PCP: Deland Pretty, MD   Chief Complaint  Patient presents with  . Fall  . Hip Pain  Brief Narrative: 79 year old female with significant history of dementia, GERD, hyperlipidemia who had a fall 1 day prior to hospital visit and has been sedentary unable to walk and brought to the ED.  History unable to obtain due to dementia.  In the ED imaging studies showed right femoral fracture patient had leukocytosis but hemodynamically stable, orthopedic was consulted for further management and patient was admitted  Subjective: Seen this morning.  Patient is status post intramedullary IM nail intertrochanteric on right Appears somewhat sleepy woke up does not answer any questions, appears to be in pain.  Eyes are open but not communicating.  Assessment & Plan:  Right femoral fracture secondary to mechanical fall at home.  Status post right intertrochanteric IM nail for the fracture. Check vitamin D level.  Further plan DVT prophylaxis asa 325 mg daily, PT OT as per ortho.  Dementia:we will keep the patient on fall precaution, supportive care, monitor for delirium in the setting of #1  Hyperlipidemia:Not on meds.  Leukocytosis:He is afebrile likely in the setting of #1.Repeat CBC.Monitor for any infection.  Elevated blood pressure reading: Not on antihypertensives at home.  Could be from pain.  If remains elevated despite pain control may need to be initiated on blood pressure medication so we will continue to monitor continue as needed iv medication.  DNR  DVT prophylaxis: SCDs Start: 01/31/20 0915 Place TED hose Start: 01/31/20 0915 SCDs Start: 01/30/20 1539 Code Status:   Code Status: DNR  Family Communication: plan of care discussed with patient at bedside.  Status is: Inpatient  Remains inpatient appropriate because for hip fracture and post op management  Dispo: The patient is from: Home               Anticipated d/c is to: SNF              Anticipated d/c date is: 2 days              Patient currently is not medically stable to d/c.  Diet Order            Diet regular Room service appropriate? Yes; Fluid consistency: Thin  Diet effective now                 Body mass index is 17.65 kg/m.  Consultants:see note  Procedures:see note Microbiology:see note Blood Culture No results found for: SDES, SPECREQUEST, CULT, REPTSTATUS  Other culture-see note  Medications: Scheduled Meds: . [START ON 02/01/2020] aspirin EC  325 mg Oral Q breakfast  . Chlorhexidine Gluconate Cloth  6 each Topical Daily  . docusate sodium  100 mg Oral BID  . HYDROmorphone      . mupirocin ointment  1 application Nasal BID  . senna  1 tablet Oral BID   Continuous Infusions: . sodium chloride 75 mL/hr at 01/30/20 1743  . ceFAZolin    .  ceFAZolin (ANCEF) IV    . methocarbamol (ROBAXIN) IV      Antimicrobials: Anti-infectives (From admission, onward)   Start     Dose/Rate Route Frequency Ordered Stop   01/31/20 1400  ceFAZolin (ANCEF) IVPB 2g/100 mL premix        2 g 200 mL/hr over 30 Minutes Intravenous Every 6 hours 01/31/20 1000 02/01/20 0159   01/31/20 0732  ceFAZolin (  ANCEF) 2-4 GM/100ML-% IVPB       Note to Pharmacy: Eliberto Ivory   : cabinet override      01/31/20 0732 01/31/20 1944       Objective: Vitals: Today's Vitals   01/31/20 0940 01/31/20 0953 01/31/20 1053 01/31/20 1055  BP: (!) 147/86 (!) 160/79 (!) 146/67   Pulse: 62 71 (!) 58 62  Resp: 11  12 14   Temp: (!) 97.5 F (36.4 C) 97.6 F (36.4 C) 98 F (36.7 C)   TempSrc:  Oral Axillary   SpO2: 99% 97% 100% 100%  Weight:      Height:      PainSc:  Asleep Asleep     Intake/Output Summary (Last 24 hours) at 01/31/2020 1116 Last data filed at 01/31/2020 0844 Gross per 24 hour  Intake 2048.43 ml  Output 1000 ml  Net 1048.43 ml   Filed Weights   01/30/20 1103  Weight: 41 kg   Weight change:    Intake/Output  from previous day: 08/27 0701 - 08/28 0700 In: 1298.4 [P.O.:390; I.V.:908.4] Out: 950 [Urine:950] Intake/Output this shift: Total I/O In: 750 [I.V.:700; IV Piggyback:50] Out: 50 [Blood:50]  Examination:  General exam: drowsy, lethargic,NAD,weak appearing. HEENT:Oral mucosa moist, Ear/Nose WNL grossly,dentition normal. Respiratory system: bilaterally  diminished,no wheezing or crackles,no use of accessory muscle, non tender. Cardiovascular system: S1 & S2 +, regular, No JVD. Gastrointestinal system: Abdomen soft, NT,ND, BS+. Nervous System:Alert, awake, moving extremities and grossly nonfocal Extremities: rt femur area- dressing intact, ice +. Edema, distal peripheral pulses palpable.  Skin: No rashes,no icterus. MSK: Normal muscle bulk,tone, power  Data Reviewed: I have personally reviewed following labs and imaging studies CBC: Recent Labs  Lab 01/30/20 1117  WBC 14.1*  NEUTROABS 11.5*  HGB 14.4  HCT 44.9  MCV 92.8  PLT 947   Basic Metabolic Panel: Recent Labs  Lab 01/30/20 1117  NA 138  K 4.2  CL 102  CO2 25  GLUCOSE 142*  BUN 23  CREATININE 0.80  CALCIUM 8.8*   GFR: Estimated Creatinine Clearance: 36.3 mL/min (by C-G formula based on SCr of 0.8 mg/dL). Liver Function Tests: No results for input(s): AST, ALT, ALKPHOS, BILITOT, PROT, ALBUMIN in the last 168 hours. No results for input(s): LIPASE, AMYLASE in the last 168 hours. No results for input(s): AMMONIA in the last 168 hours. Coagulation Profile: Recent Labs  Lab 01/30/20 1117  INR 1.0   Cardiac Enzymes: No results for input(s): CKTOTAL, CKMB, CKMBINDEX, TROPONINI in the last 168 hours. BNP (last 3 results) No results for input(s): PROBNP in the last 8760 hours. HbA1C: No results for input(s): HGBA1C in the last 72 hours. CBG: Recent Labs  Lab 01/30/20 2356 01/31/20 1005  GLUCAP 173* 103*   Lipid Profile: No results for input(s): CHOL, HDL, LDLCALC, TRIG, CHOLHDL, LDLDIRECT in the last 72  hours. Thyroid Function Tests: No results for input(s): TSH, T4TOTAL, FREET4, T3FREE, THYROIDAB in the last 72 hours. Anemia Panel: No results for input(s): VITAMINB12, FOLATE, FERRITIN, TIBC, IRON, RETICCTPCT in the last 72 hours. Sepsis Labs: No results for input(s): PROCALCITON, LATICACIDVEN in the last 168 hours.  Recent Results (from the past 240 hour(s))  SARS Coronavirus 2 by RT PCR (hospital order, performed in [facility] hospital lab) Nasopharyngeal Nasopharyngeal Swab     Status: None   Collection Time: 01/30/20 11:17 AM   Specimen: Nasopharyngeal Swab  Result Value Ref Range Status   SARS Coronavirus 2 NEGATIVE NEGATIVE Final    Comment: (NOTE) SARS-CoV-2 target  nucleic acids are NOT DETECTED.  The SARS-CoV-2 RNA is generally detectable in upper and lower respiratory specimens during the acute phase of infection. The lowest concentration of SARS-CoV-2 viral copies this assay can detect is 250 copies / mL. A negative result does not preclude SARS-CoV-2 infection and should not be used as the sole basis for treatment or other patient management decisions.  A negative result may occur with improper specimen collection / handling, submission of specimen other than nasopharyngeal swab, presence of viral mutation(s) within the areas targeted by this assay, and inadequate number of viral copies (<250 copies / mL). A negative result must be combined with clinical observations, patient history, and epidemiological information.  Fact Sheet for Patients:   StrictlyIdeas.no  Fact Sheet for Healthcare Providers: BankingDealers.co.za  This test is not yet approved or  cleared by the [place] FDA and has been authorized for detection and/or diagnosis of SARS-CoV-2 by FDA under an Emergency Use Authorization (EUA).  This EUA will remain in effect (meaning this test can be used) for the duration of the COVID-19 declaration under  Section 564(b)(1) of the Act, 21 U.S.C. section 360bbb-3(b)(1), unless the authorization is terminated or revoked sooner.  Performed at [facility], [place] [address] [place], [place] [address]   Surgical pcr screen     Status: Abnormal   Collection Time: 01/30/20  6:01 PM   Specimen: Nasal Mucosa; Nasal Swab  Result Value Ref Range Status   MRSA, PCR NEGATIVE NEGATIVE Final   Staphylococcus aureus POSITIVE (A) NEGATIVE Final    Comment: (NOTE) The Xpert SA Assay (FDA approved for NASAL specimens in patients 38 years of age and older), is one component of a comprehensive surveillance program. It is not intended to diagnose infection nor to guide or monitor treatment. Performed at [facility], [place] [address] [place], [place] [address]       Radiology Studies: DG Pelvis 1-2 Views  Result Date: 01/30/2020 CLINICAL DATA:  Fall, hip pain EXAM: PELVIS - 1-2 VIEW; RIGHT FEMUR 2 VIEWS COMPARISON:  None. FINDINGS: Osteopenia. There is a slightly impacted and foreshortened inter trochanteric fracture of the proximal right femur. No obvious fracture of the pelvis or proximal left femur seen in single frontal view. No fracture or dislocation of the distal right femur. IMPRESSION: 1. There is a slightly impacted and foreshortened intertrochanteric fracture of the proximal right femur. 2.  No fracture or dislocation of the distal right femur. Electronically Signed   By: Eddie Candle M.D.   On: 01/30/2020 13:15   Chest Portable 1 View  Result Date: 01/30/2020 CLINICAL DATA:  Preop for right hip fracture. EXAM: PORTABLE CHEST 1 VIEW COMPARISON:  None. FINDINGS: The heart size and mediastinal contours are within normal limits. Both lungs are clear. The visualized skeletal structures are unremarkable. IMPRESSION: No active disease. Aortic Atherosclerosis (ICD10-I70.0). Electronically Signed   By: Marijo Conception M.D.   On: 01/30/2020 15:55   DG C-Arm 1-60 Min-No  Report  Result Date: 01/31/2020 Fluoroscopy was utilized by the requesting physician.  No radiographic interpretation.   DG Femur Min 2 Views Right  Result Date: 01/30/2020 CLINICAL DATA:  Fall, hip pain EXAM: PELVIS - 1-2 VIEW; RIGHT FEMUR 2 VIEWS COMPARISON:  None. FINDINGS: Osteopenia. There is a slightly impacted and foreshortened inter trochanteric fracture of the proximal right femur. No obvious fracture of the pelvis or proximal left femur seen in single frontal view. No fracture or dislocation of the distal right  femur. IMPRESSION: 1. There is a slightly impacted and foreshortened intertrochanteric fracture of the proximal right femur. 2.  No fracture or dislocation of the distal right femur. Electronically Signed   By: Eddie Candle M.D.   On: 01/30/2020 13:15     LOS: 1 day   Antonieta Pert, MD [facility]  01/31/2020, 11:16 AM

## 2020-01-31 NOTE — Anesthesia Postprocedure Evaluation (Signed)
Anesthesia Post Note  Patient: Olivia Phillips  Procedure(s) Performed: INTRAMEDULLARY (IM) NAIL INTERTROCHANTRIC (Right Hip)     Patient location during evaluation: PACU Anesthesia Type: General Level of consciousness: awake and alert Pain management: pain level controlled Vital Signs Assessment: post-procedure vital signs reviewed and stable Respiratory status: spontaneous breathing, nonlabored ventilation and respiratory function stable Cardiovascular status: blood pressure returned to baseline and stable Postop Assessment: no apparent nausea or vomiting Anesthetic complications: no   No complications documented.  Last Vitals:  Vitals:   01/31/20 0900 01/31/20 0915  BP: (!) 152/76 (!) 159/88  Pulse: 69 83  Resp: (!) 9 15  Temp:    SpO2: 100% 97%    Last Pain:  Vitals:   01/31/20 0848  TempSrc:   PainSc: Asleep    LLE Motor Response: (P) Responds to commands (01/31/20 0915) LLE Sensation: (P) No numbness;No tingling (01/31/20 0915) RLE Motor Response: (P) Responds to commands (01/31/20 0915) RLE Sensation: (P) No numbness;No tingling (01/31/20 0915)      Lynda Rainwater

## 2020-01-31 NOTE — Anesthesia Procedure Notes (Signed)
Procedure Name: LMA Insertion Date/Time: 01/31/2020 7:48 AM Performed by: Gerald Leitz, CRNA Pre-anesthesia Checklist: Patient identified, Patient being monitored, Timeout performed, Emergency Drugs available and Suction available Patient Re-evaluated:Patient Re-evaluated prior to induction Oxygen Delivery Method: Circle system utilized Preoxygenation: Pre-oxygenation with 100% oxygen Induction Type: IV induction Ventilation: Mask ventilation without difficulty LMA: LMA inserted and LMA with gastric port inserted LMA Size: 4.0 Tube type: Oral Number of attempts: 1 Placement Confirmation: positive ETCO2 and breath sounds checked- equal and bilateral Tube secured with: Tape Dental Injury: Teeth and Oropharynx as per pre-operative assessment

## 2020-01-31 NOTE — Progress Notes (Signed)
Patient ID: Olivia Phillips, female   DOB: Jun 15, 1940, 79 y.o.   MRN: [id number] Our plan is to proceed to surgery today for open reduction/internal fixation of her right hip fracture.  I did speak with her sister who does wish for [place] to proceed with surgery for pain control and quality of life improvement.  Although the patient has dementia, she does ambulate and exhibits pain.

## 2020-02-01 LAB — CBC
HCT: 34.8 % — ABNORMAL LOW (ref 36.0–46.0)
Hemoglobin: 11 g/dL — ABNORMAL LOW (ref 12.0–15.0)
MCH: 29.7 pg (ref 26.0–34.0)
MCHC: 31.6 g/dL (ref 30.0–36.0)
MCV: 94.1 fL (ref 80.0–100.0)
Platelets: 261 10*3/uL (ref 150–400)
RBC: 3.7 MIL/uL — ABNORMAL LOW (ref 3.87–5.11)
RDW: 13.1 % (ref 11.5–15.5)
WBC: 8.3 10*3/uL (ref 4.0–10.5)
nRBC: 0 % (ref 0.0–0.2)

## 2020-02-01 LAB — BASIC METABOLIC PANEL
Anion gap: 8 (ref 5–15)
BUN: 24 mg/dL — ABNORMAL HIGH (ref 8–23)
CO2: 26 mmol/L (ref 22–32)
Calcium: 8.8 mg/dL — ABNORMAL LOW (ref 8.9–10.3)
Chloride: 107 mmol/L (ref 98–111)
Creatinine, Ser: 0.86 mg/dL (ref 0.44–1.00)
GFR calc Af Amer: >60 mL/min (ref >60)
GFR calc non Af Amer: >60 mL/min (ref >60)
Glucose, Bld: 97 mg/dL (ref 70–99)
Potassium: 3.9 mmol/L (ref 3.5–5.1)
Sodium: 141 mmol/L (ref 135–145)

## 2020-02-01 LAB — GLUCOSE, CAPILLARY
Glucose-Capillary: 105 mg/dL — ABNORMAL HIGH (ref 70–99)
Glucose-Capillary: 90 mg/dL (ref 70–99)
Glucose-Capillary: 99 mg/dL (ref 70–99)

## 2020-02-01 LAB — VITAMIN D 25 HYDROXY (VIT D DEFICIENCY, FRACTURES): Vit D, 25-Hydroxy: 20.73 ng/mL — ABNORMAL LOW (ref 30–100)

## 2020-02-01 MED ORDER — VITAMIN D (ERGOCALCIFEROL) 1.25 MG (50000 UNIT) PO CAPS
50000.0000 [IU] | ORAL_CAPSULE | ORAL | Status: DC
  Administered 2020-02-01: 50000 [IU] via ORAL
  Filled 2020-02-01: qty 1

## 2020-02-01 NOTE — Progress Notes (Signed)
PROGRESS NOTE    Olivia Phillips  UXL:[id number] DOB: Mar 22, 1941 DOA: 01/30/2020 PCP: Deland Pretty, MD   Chief Complaint  Patient presents with  . Fall  . Hip Pain  Brief Narrative: 79 year old female with significant history of dementia, GERD, hyperlipidemia who had a fall 1 day prior to hospital visit and has been sedentary unable to walk and brought to the ED.  History unable to obtain due to dementia.  In the ED imaging studies showed right femoral fracture patient had leukocytosis but hemodynamically stable, orthopedic was consulted for further management and patient was admitted Seen by orthopedics underwent right intertrochanteric IM nail 8/28.  Subjective: This morning examined at bedside.  Alert awake oriented to self Nursing has had 4r IV line schange but patient keeps on removing it. Currently not needing any IV medication and iv line is kept on hold With baseline dementia pretty much close to baseline as per the son.  Assessment & Plan:  Right femoral fracture secondary to mechanical fall at home.  S/p right intertrochanteric IM nail.  Postop doing well continue DVT prophylaxis with aspirin 325 mg daily, PT OT, and skilled nursing facility.   Severe vitamin D deficiency at 43, will start on 50,000 u vitamin D replacement weekly  Dementia: Alert awake oriented to self at baseline continue supportive care fall precaution delirium precaution   Hyperlipidemia not on meds..  Leukocytosis: Resolved.  Patient is afebrile.    Elevated blood pressure reading, not on antihypertensives at home.  Blood pressure is stable after pain is controlled and postop.  DNR  DVT prophylaxis: asa 325 mg per ortho SCD. Code Status:   Code Status: DNR  Family Communication: plan of care discussed with patient at bedside.  Discussed with the nursing staff , son was at the bedside this am but has left and is aware about the SNF disposition plan nursing has spoken to him.  Status is:  Inpatient  Remains inpatient appropriate because for hip fracture and post op management  Dispo: The patient is from: Home              Anticipated d/c is to: SNF              Anticipated d/c date is: once bd available              Patient currently is stable for discharge  Diet Order            Diet regular Room service appropriate? Yes; Fluid consistency: Thin  Diet effective now                 Body mass index is 17.65 kg/m.  Consultants:see note  Procedures:see note Microbiology:see note Blood Culture No results found for: SDES, SPECREQUEST, CULT, REPTSTATUS  Other culture-see note  Medications: Scheduled Meds: . aspirin EC  325 mg Oral Q breakfast  . Chlorhexidine Gluconate Cloth  6 each Topical Daily  . docusate sodium  100 mg Oral BID  . mupirocin ointment  1 application Nasal BID  . senna  1 tablet Oral BID   Continuous Infusions: . methocarbamol (ROBAXIN) IV      Antimicrobials: Anti-infectives (From admission, onward)   Start     Dose/Rate Route Frequency Ordered Stop   01/31/20 1400  ceFAZolin (ANCEF) IVPB 2g/100 mL premix        2 g 200 mL/hr over 30 Minutes Intravenous Every 6 hours 01/31/20 1000 01/31/20 2009       Objective: Vitals: Today's  Vitals   02/01/20 0107 02/01/20 0558 02/01/20 0740 02/01/20 1018  BP: 109/63 136/65  126/66  Pulse: (!) 58 (!) 57  94  Resp: 16 14  14   Temp: 97.9 F (36.6 C) 98.5 F (36.9 C)  98 F (36.7 C)  TempSrc: Oral Oral  Oral  SpO2: 99% 99%  95%  Weight:      Height:      PainSc:   0-No pain     Intake/Output Summary (Last 24 hours) at 02/01/2020 1102 Last data filed at 02/01/2020 0951 Gross per 24 hour  Intake 2047.88 ml  Output 500 ml  Net 1547.88 ml   Filed Weights   01/30/20 1103  Weight: 41 kg   Weight change:    Intake/Output from previous day: 08/28 0701 - 08/29 0700 In: 1962 [P.O.:180; I.V.:1732; IV Piggyback:50] Out: 550 [Urine:500; Blood:50] Intake/Output this shift: Total I/O In:  835.9 [P.O.:360; I.V.:475.9] Out: -   Examination:  General exam: AAOx1, NAD, weak appearing. HEENT:Oral mucosa moist, Ear/Nose WNL grossly, dentition normal. Respiratory system: bilaterally clear,no wheezing or crackles,no use of accessory muscle Cardiovascular system: S1 & S2 +, No JVD,. Gastrointestinal system: Abdomen soft, NT,ND, BS+ Nervous System:Alert, awake, moving extremities and grossly nonfocal Extremities: Right femur area with postop dressing intact, no drainage or bleeding, no edema, distal peripheral pulses palpable.  Skin: No rashes,no icterus. MSK: Normal muscle bulk,tone, power    Data Reviewed: I have personally reviewed following labs and imaging studies CBC: Recent Labs  Lab 01/30/20 1117 02/01/20 0256  WBC 14.1* 8.3  NEUTROABS 11.5*  --   HGB 14.4 11.0*  HCT 44.9 34.8*  MCV 92.8 94.1  PLT 312 409   Basic Metabolic Panel: Recent Labs  Lab 01/30/20 1117 02/01/20 0256  NA 138 141  K 4.2 3.9  CL 102 107  CO2 25 26  GLUCOSE 142* 97  BUN 23 24*  CREATININE 0.80 0.86  CALCIUM 8.8* 8.8*   GFR: Estimated Creatinine Clearance: 33.8 mL/min (by C-G formula based on SCr of 0.86 mg/dL). Liver Function Tests: No results for input(s): AST, ALT, ALKPHOS, BILITOT, PROT, ALBUMIN in the last 168 hours. No results for input(s): LIPASE, AMYLASE in the last 168 hours. No results for input(s): AMMONIA in the last 168 hours. Coagulation Profile: Recent Labs  Lab 01/30/20 1117  INR 1.0   Cardiac Enzymes: No results for input(s): CKTOTAL, CKMB, CKMBINDEX, TROPONINI in the last 168 hours. BNP (last 3 results) No results for input(s): PROBNP in the last 8760 hours. HbA1C: No results for input(s): HGBA1C in the last 72 hours. CBG: Recent Labs  Lab 01/30/20 2356 01/31/20 1005 01/31/20 1816 01/31/20 2356 02/01/20 0826  GLUCAP 173* 103* 185* 120* 99   Lipid Profile: No results for input(s): CHOL, HDL, LDLCALC, TRIG, CHOLHDL, LDLDIRECT in the last 72  hours. Thyroid Function Tests: No results for input(s): TSH, T4TOTAL, FREET4, T3FREE, THYROIDAB in the last 72 hours. Anemia Panel: No results for input(s): VITAMINB12, FOLATE, FERRITIN, TIBC, IRON, RETICCTPCT in the last 72 hours. Sepsis Labs: No results for input(s): PROCALCITON, LATICACIDVEN in the last 168 hours.  Recent Results (from the past 240 hour(s))  SARS Coronavirus 2 by RT PCR (hospital order, performed in [facility] hospital lab) Nasopharyngeal Nasopharyngeal Swab     Status: None   Collection Time: 01/30/20 11:17 AM   Specimen: Nasopharyngeal Swab  Result Value Ref Range Status   SARS Coronavirus 2 NEGATIVE NEGATIVE Final    Comment: (NOTE) SARS-CoV-2 target nucleic acids are NOT  DETECTED.  The SARS-CoV-2 RNA is generally detectable in upper and lower respiratory specimens during the acute phase of infection. The lowest concentration of SARS-CoV-2 viral copies this assay can detect is 250 copies / mL. A negative result does not preclude SARS-CoV-2 infection and should not be used as the sole basis for treatment or other patient management decisions.  A negative result may occur with improper specimen collection / handling, submission of specimen other than nasopharyngeal swab, presence of viral mutation(s) within the areas targeted by this assay, and inadequate number of viral copies (<250 copies / mL). A negative result must be combined with clinical observations, patient history, and epidemiological information.  Fact Sheet for Patients:   StrictlyIdeas.no  Fact Sheet for Healthcare Providers: BankingDealers.co.za  This test is not yet approved or  cleared by the [place] FDA and has been authorized for detection and/or diagnosis of SARS-CoV-2 by FDA under an Emergency Use Authorization (EUA).  This EUA will remain in effect (meaning this test can be used) for the duration of the COVID-19 declaration under  Section 564(b)(1) of the Act, 21 U.S.C. section 360bbb-3(b)(1), unless the authorization is terminated or revoked sooner.  Performed at [facility], [place] [address], [place], [place] [address]   Surgical pcr screen     Status: Abnormal   Collection Time: 01/30/20  6:01 PM   Specimen: Nasal Mucosa; Nasal Swab  Result Value Ref Range Status   MRSA, PCR NEGATIVE NEGATIVE Final   Staphylococcus aureus POSITIVE (A) NEGATIVE Final    Comment: (NOTE) The Xpert SA Assay (FDA approved for NASAL specimens in patients 3 years of age and older), is one component of a comprehensive surveillance program. It is not intended to diagnose infection nor to guide or monitor treatment. Performed at [facility], [place] [address] [place], [place] [address]       Radiology Studies: DG Pelvis 1-2 Views  Result Date: 01/30/2020 CLINICAL DATA:  Fall, hip pain EXAM: PELVIS - 1-2 VIEW; RIGHT FEMUR 2 VIEWS COMPARISON:  None. FINDINGS: Osteopenia. There is a slightly impacted and foreshortened inter trochanteric fracture of the proximal right femur. No obvious fracture of the pelvis or proximal left femur seen in single frontal view. No fracture or dislocation of the distal right femur. IMPRESSION: 1. There is a slightly impacted and foreshortened intertrochanteric fracture of the proximal right femur. 2.  No fracture or dislocation of the distal right femur. Electronically Signed   By: Eddie Candle M.D.   On: 01/30/2020 13:15   Chest Portable 1 View  Result Date: 01/30/2020 CLINICAL DATA:  Preop for right hip fracture. EXAM: PORTABLE CHEST 1 VIEW COMPARISON:  None. FINDINGS: The heart size and mediastinal contours are within normal limits. Both lungs are clear. The visualized skeletal structures are unremarkable. IMPRESSION: No active disease. Aortic Atherosclerosis (ICD10-I70.0). Electronically Signed   By: Marijo Conception M.D.   On: 01/30/2020 15:55   DG C-Arm 1-60 Min-No  Report  Result Date: 01/31/2020 Fluoroscopy was utilized by the requesting physician.  No radiographic interpretation.   DG HIP OPERATIVE UNILAT W OR W/O PELVIS RIGHT  Result Date: 01/31/2020 CLINICAL DATA:  Right femoral IM nail. EXAM: OPERATIVE RIGHT HIP (WITH PELVIS IF PERFORMED) TECHNIQUE: Fluoroscopic spot image(s) were submitted for interpretation post-operatively. COMPARISON:  Preoperative radiograph yesterday. FINDINGS: Six fluoroscopic spot views obtained in the operating room. Femoral intramedullary nail with trans trochanteric screw fixation of intertrochanteric femur fracture. Total fluoroscopy time 1 minutes. IMPRESSION: Procedural fluoroscopy  during fixation of right proximal femur fracture. Electronically Signed   By: Keith Rake M.D.   On: 01/31/2020 15:51   DG Femur Min 2 Views Right  Result Date: 01/30/2020 CLINICAL DATA:  Fall, hip pain EXAM: PELVIS - 1-2 VIEW; RIGHT FEMUR 2 VIEWS COMPARISON:  None. FINDINGS: Osteopenia. There is a slightly impacted and foreshortened inter trochanteric fracture of the proximal right femur. No obvious fracture of the pelvis or proximal left femur seen in single frontal view. No fracture or dislocation of the distal right femur. IMPRESSION: 1. There is a slightly impacted and foreshortened intertrochanteric fracture of the proximal right femur. 2.  No fracture or dislocation of the distal right femur. Electronically Signed   By: Eddie Candle M.D.   On: 01/30/2020 13:15     LOS: 2 days   Antonieta Pert, MD [facility]  02/01/2020, 11:02 AM

## 2020-02-01 NOTE — Evaluation (Signed)
Physical Therapy Evaluation Patient Details Name: Olivia Phillips MRN: [id number] DOB: 1940/09/10 Today's Date: 02/01/2020   History of Present Illness  BEULAH CAPOBIANCO is a 79 y.o. female with medical history significant of dementia, GERD, hyperlipidemia who presents to the emergency department after mechanical fall with R femur fx and is now s/p ORIF (01/31/20).  Clinical Impression  Pt admitted with above diagnosis. Pt currently with functional limitations due to the deficits listed below (see PT Problem List). Pt will benefit from skilled PT to increase their independence and safety with mobility to allow discharge to the venue listed below.  Pt pleasant and follows some simple one step commands and visual cueing. She required +2 A for bed mobility and sit > stand. Recommend SNF for rehab as son cannot care for pt at her current level.     Follow Up Recommendations SNF;Supervision/Assistance - 24 hour    Equipment Recommendations  None recommended by PT    Recommendations for Other Services       Precautions / Restrictions Precautions Precautions: Fall;Other (comment) Precaution Comments: dementia Restrictions Weight Bearing Restrictions: Yes RLE Weight Bearing: Weight bearing as tolerated      Mobility  Bed Mobility Overal bed mobility: Needs Assistance Bed Mobility: Supine to Sit;Sit to Supine     Supine to sit: Max assist;+2 for physical assistance Sit to supine: Total assist;+2 for physical assistance   General bed mobility comments: Pt attempting to help with transfer to EOB, but required TOT to return to supine  Transfers Overall transfer level: Needs assistance Equipment used: Rolling walker (2 wheeled) Transfers: Sit to/from Stand Sit to Stand: Mod assist;+2 physical assistance         General transfer comment: Stood with MOD A of 2, but with posterior lean. Facilitated upright posture with tactile cueing.  Ambulation/Gait             General Gait  Details: Attempted side stepping, but pt unable to perform.  Stairs            Wheelchair Mobility    Modified Rankin (Stroke Patients Only)       Balance Overall balance assessment: Needs assistance;History of Falls Sitting-balance support: Feet supported Sitting balance-Leahy Scale: Fair     Standing balance support: Bilateral upper extremity supported Standing balance-Leahy Scale: Poor Standing balance comment: posterior lean                             Pertinent Vitals/Pain Pain Assessment: Faces Faces Pain Scale: Hurts whole lot Pain Location: R LE with movement Pain Descriptors / Indicators: Grimacing;Guarding;Other (Comment) (shaking her leg after standing) Pain Intervention(s): Limited activity within patient's tolerance;Monitored during session;Repositioned;Utilized relaxation techniques    Home Living Family/patient expects to be discharged to:: Skilled nursing facility Living Arrangements: Children               Additional Comments: Lives with son    Prior Function Level of Independence: Independent         Comments: Amb without AD. Son reports pt would do what she wanted and reports he was having a hard time taking care of her.     Hand Dominance        Extremity/Trunk Assessment        Lower Extremity Assessment Lower Extremity Assessment: RLE deficits/detail RLE Deficits / Details: minimal hip/knee ROM due to pain/guarding RLE: Unable to fully assess due to pain  Communication      Cognition Arousal/Alertness: Awake/alert Behavior During Therapy: WFL for tasks assessed/performed Overall Cognitive Status: History of cognitive impairments - at baseline                                 General Comments: Will follow some simple 1 step commands      General Comments General comments (skin integrity, edema, etc.): son present at beginning of eval, but not for mobility portion.    Exercises      Assessment/Plan    PT Assessment Patient needs continued PT services  PT Problem List Decreased strength;Decreased range of motion;Decreased activity tolerance;Decreased balance;Decreased mobility;Decreased cognition;Decreased knowledge of use of DME;Decreased safety awareness       PT Treatment Interventions DME instruction;Gait training;Functional mobility training;Neuromuscular re-education;Balance training;Therapeutic activities;Therapeutic exercise;Patient/family education    PT Goals (Current goals can be found in the Care Plan section)  Acute Rehab PT Goals Patient Stated Goal: go to rehab PT Goal Formulation: With family Time For Goal Achievement: 02/15/20 Potential to Achieve Goals: Fair    Frequency Min 2X/week   Barriers to discharge Decreased caregiver support son unable to care for the patient at her current level    Co-evaluation               AM-PAC PT "6 Clicks" Mobility  Outcome Measure Help needed turning from your back to your side while in a flat bed without using bedrails?: A Little Help needed moving from lying on your back to sitting on the side of a flat bed without using bedrails?: A Lot Help needed moving to and from a bed to a chair (including a wheelchair)?: Total Help needed standing up from a chair using your arms (e.g., wheelchair or bedside chair)?: A Lot Help needed to walk in hospital room?: Total Help needed climbing 3-5 steps with a railing? : Total 6 Click Score: 10    End of Session Equipment Utilized During Treatment: Gait belt Activity Tolerance: Patient limited by pain Patient left: in bed;with call bell/phone within reach;with bed alarm set;Other (comment) (activity apron) Nurse Communication: Mobility status PT Visit Diagnosis: Unsteadiness on feet (R26.81);History of falling (Z91.81);Muscle weakness (generalized) (M62.81);Pain Pain - Right/Left: Right Pain - part of body: Leg    Time: 1030-1050 PT Time Calculation  (min) (ACUTE ONLY): 20 min   Charges:   PT Evaluation $PT Eval Low Complexity: 1 Low          Skiler Olden L. Tamala Julian, [place] Pager [phone] 02/01/2020   Galen Manila 02/01/2020, 12:09 PM

## 2020-02-01 NOTE — NC FL2 (Signed)
[place] LEVEL OF CARE SCREENING TOOL     IDENTIFICATION  Patient Name: Olivia Phillips Birthdate: 05-04-1941 Sex: female Admission Date (Current Location): 01/30/2020  [facility] and [place] Number:  Herbalist and Address:  [facility],  [place] [address], [place]      Provider Number: [id number]  Attending Physician Name and Address:  Antonieta Pert, MD  Relative Name and Phone Number:       Current Level of Care: Hospital Recommended Level of Care: [place] Prior Approval Number:    Date Approved/Denied:   PASRR Number: [id number]  Discharge Plan: SNF    Current Diagnoses: Patient Active Problem List   Diagnosis Date Noted   Right femoral fracture ([place]) 01/30/2020   Leukocytosis 01/30/2020   Elevated blood pressure reading 01/30/2020   DNR (do not resuscitate) 01/30/2020   Hyperlipidemia 03/15/2016   Tobacco abuse 03/15/2016   Syncope 03/15/2016    Orientation RESPIRATION BLADDER Height & Weight     Self, Place  Normal Incontinent Weight: 90 lb 6.2 oz (41 kg) Height:  5' (152.4 cm)  BEHAVIORAL SYMPTOMS/MOOD NEUROLOGICAL BOWEL NUTRITION STATUS      Continent Diet (see dc summary)  AMBULATORY STATUS COMMUNICATION OF NEEDS Skin   Extensive Assist Verbally Surgical wounds                       Personal Care Assistance Level of Assistance  Bathing, Feeding, Dressing Bathing Assistance: Limited assistance Feeding assistance: Independent Dressing Assistance: Limited assistance     Functional Limitations Info  Sight, Hearing, Speech Sight Info: Adequate Hearing Info: Adequate Speech Info: Adequate    SPECIAL CARE FACTORS FREQUENCY  PT (By licensed PT), OT (By licensed OT)     PT Frequency: 5x week OT Frequency: 3x week            Contractures Contractures Info: Not present    Additional Factors Info  Code Status, Allergies Code Status Info: DNR Allergies Info: Codeine,  Alendronate Sodium, Ibuprofen           Current Medications (02/01/2020):  This is the current hospital active medication list Current Facility-Administered Medications  Medication Dose Route Frequency Provider Last Rate Last Admin   acetaminophen (TYLENOL) tablet 650 mg  650 mg Oral Q6H PRN Mcarthur Rossetti, MD   650 mg at 01/31/20 2145   aspirin EC tablet 325 mg  325 mg Oral Q breakfast Mcarthur Rossetti, MD   325 mg at 02/01/20 1017   Chlorhexidine Gluconate Cloth 2 % PADS 6 each  6 each Topical Daily Mcarthur Rossetti, MD   6 each at 02/01/20 0946   docusate sodium (COLACE) capsule 100 mg  100 mg Oral BID Mcarthur Rossetti, MD   100 mg at 02/01/20 1017   HYDROcodone-acetaminophen (NORCO/VICODIN) 5-325 MG per tablet 1-2 tablet  1-2 tablet Oral Q6H PRN Mcarthur Rossetti, MD   1 tablet at 01/31/20 1628   menthol-cetylpyridinium (CEPACOL) lozenge 3 mg  1 lozenge Oral PRN Mcarthur Rossetti, MD       Or   phenol (CHLORASEPTIC) mouth spray 1 spray  1 spray Mouth/Throat PRN Mcarthur Rossetti, MD       methocarbamol (ROBAXIN) tablet 500 mg  500 mg Oral Q6H PRN Mcarthur Rossetti, MD   500 mg at 02/01/20 1320   Or   methocarbamol (ROBAXIN) 500 mg in dextrose 5 % 50 mL IVPB  500 mg Intravenous  Q6H PRN Mcarthur Rossetti, MD       metoCLOPramide (REGLAN) tablet 5 mg  5 mg Oral Q8H PRN Mcarthur Rossetti, MD       Or   metoCLOPramide (REGLAN) injection 5 mg  5 mg Intravenous Q8H PRN Mcarthur Rossetti, MD       morphine 2 MG/ML injection 0.5 mg  0.5 mg Intravenous Q2H PRN Mcarthur Rossetti, MD       mupirocin ointment (BACTROBAN) 2 % 1 application  1 application Nasal BID Mcarthur Rossetti, MD   1 application at 35/45/62 1003   ondansetron (ZOFRAN) tablet 4 mg  4 mg Oral Q6H PRN Mcarthur Rossetti, MD       Or   ondansetron [facility]) injection 4 mg  4 mg Intravenous Q6H PRN Mcarthur Rossetti, MD        senna Donavan Burnet) tablet 8.6 mg  1 tablet Oral BID Mcarthur Rossetti, MD   8.6 mg at 01/31/20 2145   traMADol (ULTRAM) tablet 50 mg  50 mg Oral Q6H PRN Mcarthur Rossetti, MD   50 mg at 02/01/20 1321   Vitamin D (Ergocalciferol) (DRISDOL) capsule 50,000 Units  50,000 Units Oral Q7 days Antonieta Pert, MD         Discharge Medications: Please see discharge summary for a list of discharge medications.  Relevant Imaging Results:  Relevant Lab Results:   Additional Information SSN: [id number] LCSW

## 2020-02-01 NOTE — Plan of Care (Signed)
  Problem: Clinical Measurements: Goal: Cardiovascular complication will be avoided Outcome: Progressing   Problem: Clinical Measurements: Goal: Respiratory complications will improve Outcome: Progressing   Problem: Activity: Goal: Risk for activity intolerance will decrease Outcome: Progressing   Problem: Nutrition: Goal: Adequate nutrition will be maintained Outcome: Progressing   Problem: Skin Integrity: Goal: Risk for impaired skin integrity will decrease Outcome: Progressing   Problem: Education: Goal: Verbalization of understanding the information provided (i.e., activity precautions, restrictions, etc) will improve Outcome: Progressing

## 2020-02-01 NOTE — Discharge Instructions (Signed)
Increase activities as comfort allows. New dry dressing as needed for right hip incision.

## 2020-02-01 NOTE — TOC Initial Note (Signed)
Transition of Care [facility]) - Initial/Assessment Note    Patient Details  Name: Olivia Phillips MRN: [id number] Date of Birth: Feb 15, 1941  Transition [facility] Care [facility]) CM/SW Contact:    Shade Flood, LCSW Phone Number: 02/01/2020, 1:31 PM  Clinical Narrative:                  Pt admitted from home with family. PT recommending SNF rehab.  Pt's son aware and agreeable to SNF referrals. TOC will follow up with son once bed offers available.  Expected Discharge Plan: Skilled Nursing Facility Barriers to Discharge: Continued Medical Work up, SNF Pending bed offer   Patient Goals and CMS Choice   CMS Medicare.gov Compare Post Acute Care list provided to:: Patient Represenative (must comment) Choice offered to / list presented to : Adult Children  Expected Discharge Plan and Services Expected Discharge Plan: [place] In-house Referral: Clinical Social Work   Post Acute Care Choice: [place] Living arrangements for the past 2 months: [place]                                      Prior Living Arrangements/Services Living arrangements for the past 2 months: Single Family Home Lives with:: Adult Children Patient language and need for interpreter reviewed:: Yes Do you feel safe going back to the place where you live?: Yes      Need for Family Participation in Patient Care: Yes (Comment) Care giver support system in place?: Yes (comment) Current home services: DME Criminal Activity/Legal Involvement Pertinent to Current Situation/Hospitalization: No - Comment as needed  Activities of Daily Living Home Assistive Devices/Equipment: Bedside commode/3-in-1, Shower chair with back ADL Screening (condition at time of admission) Patient's cognitive ability adequate to safely complete daily activities?: No Is the patient deaf or have difficulty hearing?: Yes Does the patient have difficulty seeing, even when wearing glasses/contacts?:  Yes Does the patient have difficulty concentrating, remembering, or making decisions?: Yes Patient able to express need for assistance with ADLs?: No Does the patient have difficulty dressing or bathing?: Yes Independently performs ADLs?: No Communication: Needs assistance Is this a change from baseline?: Pre-admission baseline Dressing (OT): Needs assistance Is this a change from baseline?: Pre-admission baseline Grooming: Needs assistance Is this a change from baseline?: Pre-admission baseline Feeding: Needs assistance Is this a change from baseline?: Pre-admission baseline Bathing: Needs assistance Is this a change from baseline?: Pre-admission baseline Toileting: Needs assistance Is this a change from baseline?: Pre-admission baseline In/Out Bed: Needs assistance Is this a change from baseline?: Pre-admission baseline Walks in Home: Dependent Is this a change from baseline?: Change from baseline, expected to last >3 days Does the patient have difficulty walking or climbing stairs?: Yes Weakness of Legs: Right Weakness of Arms/Hands: None  Permission Sought/Granted Permission sought to share information with : Chartered certified accountant granted to share information with : Yes, Verbal Permission Granted     Permission granted to share info w AGENCY: local snfs        Emotional Assessment       Orientation: : Oriented to Self Alcohol / Substance Use: Not Applicable Psych Involvement: No (comment)  Admission diagnosis:  Preop examination [Z01.818] Right hip pain [M25.551] Right femoral fracture ([place]) [S72.91XA] Fall, initial encounter [W19.XXXA] Closed displaced intertrochanteric fracture of right femur, initial encounter [facility]) [S72.141A] Patient Active Problem List   Diagnosis Date Noted   Right  femoral fracture ([place]) 01/30/2020   Leukocytosis 01/30/2020   Elevated blood pressure reading 01/30/2020   DNR (do not resuscitate) 01/30/2020    Hyperlipidemia 03/15/2016   Tobacco abuse 03/15/2016   Syncope 03/15/2016   PCP:  Deland Pretty, MD Pharmacy:   [facility] [place] [address] Phone: [phone] Fax: [phone]  [facility] [place] [place] [address] Phone: [phone] Fax: [phone]     Social Determinants of Health (SDOH) Interventions    Readmission Risk Interventions Readmission Risk Prevention Plan 02/01/2020  Medication Screening Complete  Transportation Screening Complete  Some recent data might be hidden

## 2020-02-01 NOTE — Progress Notes (Signed)
Subjective: 1 Day Post-Op Procedure(s) (LRB): INTRAMEDULLARY (IM) NAIL INTERTROCHANTRIC (Right) Patient reports pain as mild.  Tolerated surgery well yesterday.  Vitals stable.  At baseline dementia.  Follows commands appropriately.  Minimal acute blood loss anemia from her fracture and surgery.  Objective: Vital signs in last 24 hours: Temp:  [97.6 F (36.4 C)-98.5 F (36.9 C)] 98 F (36.7 C) (08/29 1018) Pulse Rate:  [57-94] 94 (08/29 1018) Resp:  [14-18] 14 (08/29 1018) BP: (99-141)/(52-86) 126/66 (08/29 1018) SpO2:  [93 %-100 %] 95 % (08/29 1018)  Intake/Output from previous day: 08/28 0701 - 08/29 0700 In: 1962 [P.O.:180; I.V.:1732; IV Piggyback:50] Out: 550 [Urine:500; Blood:50] Intake/Output this shift: Total I/O In: 835.9 [P.O.:360; I.V.:475.9] Out: -   Recent Labs    01/30/20 1117 02/01/20 0256  HGB 14.4 11.0*   Recent Labs    01/30/20 1117 02/01/20 0256  WBC 14.1* 8.3  RBC 4.84 3.70*  HCT 44.9 34.8*  PLT 312 261   Recent Labs    01/30/20 1117 02/01/20 0256  NA 138 141  K 4.2 3.9  CL 102 107  CO2 25 26  BUN 23 24*  CREATININE 0.80 0.86  GLUCOSE 142* 97  CALCIUM 8.8* 8.8*   Recent Labs    01/30/20 1117  INR 1.0    Sensation intact distally Intact pulses distally Dorsiflexion/Plantar flexion intact Incision: scant drainage   Assessment/Plan: 1 Day Post-Op Procedure(s) (LRB): INTRAMEDULLARY (IM) NAIL INTERTROCHANTRIC (Right) Up with therapy Full WBAT right hip     Mcarthur Rossetti 02/01/2020, 11:26 AM

## 2020-02-01 NOTE — Plan of Care (Signed)
  Problem: Nutrition: Goal: Adequate nutrition will be maintained Outcome: Progressing   Problem: Coping: Goal: Level of anxiety will decrease Outcome: Progressing   Problem: Pain Managment: Goal: General experience of comfort will improve Outcome: Progressing   

## 2020-02-02 DIAGNOSIS — R7989 Other specified abnormal findings of blood chemistry: Secondary | ICD-10-CM | POA: Diagnosis not present

## 2020-02-02 DIAGNOSIS — M2550 Pain in unspecified joint: Secondary | ICD-10-CM | POA: Diagnosis not present

## 2020-02-02 DIAGNOSIS — U071 COVID-19: Secondary | ICD-10-CM | POA: Diagnosis not present

## 2020-02-02 DIAGNOSIS — D473 Essential (hemorrhagic) thrombocythemia: Secondary | ICD-10-CM | POA: Diagnosis not present

## 2020-02-02 DIAGNOSIS — Z7401 Bed confinement status: Secondary | ICD-10-CM | POA: Diagnosis not present

## 2020-02-02 DIAGNOSIS — S7291XD Unspecified fracture of right femur, subsequent encounter for closed fracture with routine healing: Secondary | ICD-10-CM | POA: Diagnosis not present

## 2020-02-02 DIAGNOSIS — M6281 Muscle weakness (generalized): Secondary | ICD-10-CM | POA: Diagnosis not present

## 2020-02-02 DIAGNOSIS — D72829 Elevated white blood cell count, unspecified: Secondary | ICD-10-CM | POA: Diagnosis not present

## 2020-02-02 DIAGNOSIS — S7291XS Unspecified fracture of right femur, sequela: Secondary | ICD-10-CM | POA: Diagnosis not present

## 2020-02-02 DIAGNOSIS — E568 Deficiency of other vitamins: Secondary | ICD-10-CM | POA: Diagnosis not present

## 2020-02-02 DIAGNOSIS — R410 Disorientation, unspecified: Secondary | ICD-10-CM | POA: Diagnosis not present

## 2020-02-02 DIAGNOSIS — S79929A Unspecified injury of unspecified thigh, initial encounter: Secondary | ICD-10-CM | POA: Diagnosis not present

## 2020-02-02 DIAGNOSIS — E785 Hyperlipidemia, unspecified: Secondary | ICD-10-CM | POA: Diagnosis not present

## 2020-02-02 DIAGNOSIS — R262 Difficulty in walking, not elsewhere classified: Secondary | ICD-10-CM | POA: Diagnosis not present

## 2020-02-02 DIAGNOSIS — R5381 Other malaise: Secondary | ICD-10-CM | POA: Diagnosis not present

## 2020-02-02 DIAGNOSIS — D72828 Other elevated white blood cell count: Secondary | ICD-10-CM | POA: Diagnosis not present

## 2020-02-02 DIAGNOSIS — R41841 Cognitive communication deficit: Secondary | ICD-10-CM | POA: Diagnosis not present

## 2020-02-02 DIAGNOSIS — Z4789 Encounter for other orthopedic aftercare: Secondary | ICD-10-CM | POA: Diagnosis not present

## 2020-02-02 DIAGNOSIS — F0280 Dementia in other diseases classified elsewhere without behavioral disturbance: Secondary | ICD-10-CM | POA: Diagnosis not present

## 2020-02-02 DIAGNOSIS — R1312 Dysphagia, oropharyngeal phase: Secondary | ICD-10-CM | POA: Diagnosis not present

## 2020-02-02 DIAGNOSIS — Z20822 Contact with and (suspected) exposure to covid-19: Secondary | ICD-10-CM | POA: Diagnosis not present

## 2020-02-02 DIAGNOSIS — S7291XA Unspecified fracture of right femur, initial encounter for closed fracture: Secondary | ICD-10-CM | POA: Diagnosis not present

## 2020-02-02 DIAGNOSIS — F17200 Nicotine dependence, unspecified, uncomplicated: Secondary | ICD-10-CM | POA: Diagnosis not present

## 2020-02-02 DIAGNOSIS — S72141A Displaced intertrochanteric fracture of right femur, initial encounter for closed fracture: Secondary | ICD-10-CM | POA: Diagnosis not present

## 2020-02-02 LAB — GLUCOSE, CAPILLARY: Glucose-Capillary: 71 mg/dL (ref 70–99)

## 2020-02-02 MED ORDER — METHOCARBAMOL 500 MG PO TABS: 500.0000 mg | ORAL_TABLET | Freq: Four times a day (QID) | ORAL | Status: DC | PRN

## 2020-02-02 MED ORDER — ASPIRIN 325 MG PO TBEC: 325.0000 mg | DELAYED_RELEASE_TABLET | Freq: Every day | ORAL | 0 refills | Status: AC

## 2020-02-02 MED ORDER — ACETAMINOPHEN 325 MG PO TABS: 650.0000 mg | ORAL_TABLET | Freq: Four times a day (QID) | ORAL | Status: AC | PRN

## 2020-02-02 MED ORDER — VITAMIN D (ERGOCALCIFEROL) 1.25 MG (50000 UNIT) PO CAPS: 50000.0000 [IU] | ORAL_CAPSULE | ORAL | 0 refills | Status: AC

## 2020-02-02 MED ORDER — SENNA 8.6 MG PO TABS: 1.0000 | ORAL_TABLET | Freq: Two times a day (BID) | ORAL | 0 refills | Status: AC

## 2020-02-02 NOTE — Care Management Important Message (Signed)
Important Message  Patient Details IM Letter given to the Patient Name: Olivia Phillips MRN: [id number] Date of Birth: Sep 21, 1940   Medicare Important Message Given:  Yes     Kerin Salen 02/02/2020, 10:31 AM

## 2020-02-02 NOTE — Discharge Summary (Signed)
Physician Discharge Summary  Olivia Phillips IRJ:[id number] DOB: [birth year] DOA: 01/30/2020  PCP: Deland Pretty, MD  Admit date: 01/30/2020 Discharge date: 02/02/2020  Admitted From: home Disposition:  SNF  Recommendations for Outpatient Follow-up:  1. Follow up with PCP in 1-2 weeks 2. Please obtain BMP/CBC in one week 3. Please follow up on the following pending results:  Home Health:NO  Equipment/Devices: NONE  Discharge Condition: Stable Code Status:   Code Status: DNR Diet recommendation:  Diet Order            Diet regular Room service appropriate? Yes; Fluid consistency: Thin  Diet effective now                 Brief/Interim Summary: 79 year old female with significant history of dementia, GERD, hyperlipidemia who had a fall 1 day prior to hospital visit and has been sedentary unable to walk and brought to the ED.  History unable to obtain due to dementia.  In the ED imaging studies showed right femoral fracture patient had leukocytosis but hemodynamically stable, orthopedic was consulted for further management and patient was admitted Seen by orthopedics underwent right intertrochanteric IM nail 8/28 POST OP doing well.  Her pain is controlled.  Per orthopedics continue full weight-bear as tolerated right hip, continue PT OT, DVT prophylaxis w/ asa 325 mg daily x 30 days  Discharge Diagnoses:  Right femoral fracture secondary to mechanical fall at home.  S/p right intertrochanteric IM nail.  Postop doing well continue DVT prophylaxis with aspirin 325 mg daily, PT OT, and skilled nursing facility.  She is medically stable for discharge.  Severe vitamin D deficiency at 61, continue on 50,000 u vitamin D replacement weekly  Dementia: Alert awake oriented x1 pleasant follows commands.  Pleasantly demented.  Watch for sundowning fall precaution.  Hyperlipidemia not on meds..  Leukocytosis: Resolved.  Patient is afebrile.    Elevated blood pressure reading on  admission, blood pressure controlled now, initial high likely from pain.  Discussed w/ Dr. Ninfa Linden okay for discharge to skilled nursing facility on aspirin for DVT prophylaxis.  Consults:  Orthopedics  Subjective: Resting well alert awake oriented x1, pleasantly demented, no pain.  Discharge Exam: Vitals:   02/01/20 2056 02/02/20 0545  BP: (!) 153/71 136/88  Pulse: 71 83  Resp: 18 18  Temp: 98.7 F (37.1 C) 97.8 F (36.6 C)  SpO2: 95% 97%   General: Pt is alert, awake, not in acute distress Cardiovascular: RRR, S1/S2 +, no rubs, no gallops Respiratory: CTA bilaterally, no wheezing, no rhonchi Abdominal: Soft, NT, ND, bowel sounds + right hip dressing site intact no drainage Extremities: no edema, no cyanosis  Discharge Instructions  Discharge Instructions    Discharge instructions   Complete by: As directed    Please call call MD or return to ER for similar or worsening recurring problem that brought you to hospital or if any fever,nausea/vomiting,abdominal pain, uncontrolled pain, chest pain,  shortness of breath or any other alarming symptoms.  Please follow-up your doctor as instructed in a week time and call the office for appointment.  Please avoid alcohol, smoking, or any other illicit substance and maintain healthy habits including taking your regular medications as prescribed.  You were cared for by a hospitalist during your hospital stay. If you have any questions about your discharge medications or the care you received while you were in the hospital after you are discharged, you can call the unit and ask to speak with the hospitalist on call if  the hospitalist that took care of you is not available.  Once you are discharged, your primary care physician will handle any further medical issues. Please note that NO REFILLS for any discharge medications will be authorized once you are discharged, as it is imperative that you return to your primary care physician (or  establish a relationship with a primary care physician if you do not have one) for your aftercare needs so that they can reassess your need for medications and monitor your lab values   Increase activity slowly   Complete by: As directed    Leave dressing on - Keep it clean, dry, and intact until clinic visit   Complete by: As directed      Allergies as of 02/02/2020      Reactions   Codeine    Alendronate Sodium Nausea And Vomiting   Ibuprofen Other (See Comments)   Muscle spasms      Medication List    TAKE these medications   acetaminophen 325 MG tablet Commonly known as: TYLENOL Take 2 tablets (650 mg total) by mouth every 6 (six) hours as needed for fever, headache or mild pain. What changed:   medication strength  how much to take  reasons to take this   aspirin 325 MG EC tablet Take 1 tablet (325 mg total) by mouth daily with breakfast. Start taking on: February 03, 2020   CALCIUM 500+D PO Take 1 tablet by mouth daily.   methocarbamol 500 MG tablet Commonly known as: ROBAXIN Take 1 tablet (500 mg total) by mouth every 6 (six) hours as needed for muscle spasms.   multivitamin with minerals Tabs tablet Take 1 tablet by mouth daily.   senna 8.6 MG Tabs tablet Commonly known as: SENOKOT Take 1 tablet (8.6 mg total) by mouth 2 (two) times daily.   Vitamin D (Ergocalciferol) 1.25 MG (50000 UNIT) Caps capsule Commonly known as: DRISDOL Take 1 capsule (50,000 Units total) by mouth every 7 (seven) days for 4 doses. Start taking on: February 08, 2020            Discharge Care Instructions  (From admission, onward)         Start     Ordered   02/02/20 0000  Leave dressing on - Keep it clean, dry, and intact until clinic visit        02/02/20 1106          Follow-up Information    Mcarthur Rossetti, MD. Schedule an appointment as soon as possible for a visit in 2 week(s).   Specialty: Orthopedic Surgery Contact information: [address] [place] [address] [phone]              Allergies  Allergen Reactions  . Codeine   . Alendronate Sodium Nausea And Vomiting  . Ibuprofen Other (See Comments)    Muscle spasms     The results of significant diagnostics from this hospitalization (including imaging, microbiology, ancillary and laboratory) are listed below for reference.    Microbiology: Recent Results (from the past 240 hour(s))  SARS Coronavirus 2 by RT PCR (hospital order, performed in [facility] hospital lab) Nasopharyngeal Nasopharyngeal Swab     Status: None   Collection Time: 01/30/20 11:17 AM   Specimen: Nasopharyngeal Swab  Result Value Ref Range Status   SARS Coronavirus 2 NEGATIVE NEGATIVE Final    Comment: (NOTE) SARS-CoV-2 target nucleic acids are NOT DETECTED.  The SARS-CoV-2 RNA is generally detectable in upper and lower  respiratory specimens during the acute phase of infection. The lowest concentration of SARS-CoV-2 viral copies this assay can detect is 250 copies / mL. A negative result does not preclude SARS-CoV-2 infection and should not be used as the sole basis for treatment or other patient management decisions.  A negative result may occur with improper specimen collection / handling, submission of specimen other than nasopharyngeal swab, presence of viral mutation(s) within the areas targeted by this assay, and inadequate number of viral copies (<250 copies / mL). A negative result must be combined with clinical observations, patient history, and epidemiological information.  Fact Sheet for Patients:   StrictlyIdeas.no  Fact Sheet for Healthcare Providers: BankingDealers.co.za  This test is not yet approved or  cleared by the [place] FDA and has been authorized for detection and/or diagnosis of SARS-CoV-2 by FDA under an Emergency Use Authorization (EUA).  This EUA will remain in effect (meaning this test can be  used) for the duration of the COVID-19 declaration under Section 564(b)(1) of the Act, 21 U.S.C. section 360bbb-3(b)(1), unless the authorization is terminated or revoked sooner.  Performed at [facility], [place] [address], [place], [place] [address]   Surgical pcr screen     Status: Abnormal   Collection Time: 01/30/20  6:01 PM   Specimen: Nasal Mucosa; Nasal Swab  Result Value Ref Range Status   MRSA, PCR NEGATIVE NEGATIVE Final   Staphylococcus aureus POSITIVE (A) NEGATIVE Final    Comment: (NOTE) The Xpert SA Assay (FDA approved for NASAL specimens in patients 76 years of age and older), is one component of a comprehensive surveillance program. It is not intended to diagnose infection nor to guide or monitor treatment. Performed at [facility], [place] [address] [place], [place] [address]     Procedures/Studies: DG Pelvis 1-2 Views  Result Date: 01/30/2020 CLINICAL DATA:  Fall, hip pain EXAM: PELVIS - 1-2 VIEW; RIGHT FEMUR 2 VIEWS COMPARISON:  None. FINDINGS: Osteopenia. There is a slightly impacted and foreshortened inter trochanteric fracture of the proximal right femur. No obvious fracture of the pelvis or proximal left femur seen in single frontal view. No fracture or dislocation of the distal right femur. IMPRESSION: 1. There is a slightly impacted and foreshortened intertrochanteric fracture of the proximal right femur. 2.  No fracture or dislocation of the distal right femur. Electronically Signed   By: Eddie Candle M.D.   On: 01/30/2020 13:15   Chest Portable 1 View  Result Date: 01/30/2020 CLINICAL DATA:  Preop for right hip fracture. EXAM: PORTABLE CHEST 1 VIEW COMPARISON:  None. FINDINGS: The heart size and mediastinal contours are within normal limits. Both lungs are clear. The visualized skeletal structures are unremarkable. IMPRESSION: No active disease. Aortic Atherosclerosis (ICD10-I70.0). Electronically Signed   By: Marijo Conception M.D.   On: 01/30/2020 15:55   DG C-Arm 1-60 Min-No Report  Result Date: 01/31/2020 Fluoroscopy was utilized by the requesting physician.  No radiographic interpretation.   DG HIP OPERATIVE UNILAT W OR W/O PELVIS RIGHT  Result Date: 01/31/2020 CLINICAL DATA:  Right femoral IM nail. EXAM: OPERATIVE RIGHT HIP (WITH PELVIS IF PERFORMED) TECHNIQUE: Fluoroscopic spot image(s) were submitted for interpretation post-operatively. COMPARISON:  Preoperative radiograph yesterday. FINDINGS: Six fluoroscopic spot views obtained in the operating room. Femoral intramedullary nail with trans trochanteric screw fixation of intertrochanteric femur fracture. Total fluoroscopy time 1 minutes. IMPRESSION: Procedural fluoroscopy during fixation of right proximal femur fracture. Electronically Signed   By: Keith Rake  M.D.   On: 01/31/2020 15:51   DG Femur Min 2 Views Right  Result Date: 01/30/2020 CLINICAL DATA:  Fall, hip pain EXAM: PELVIS - 1-2 VIEW; RIGHT FEMUR 2 VIEWS COMPARISON:  None. FINDINGS: Osteopenia. There is a slightly impacted and foreshortened inter trochanteric fracture of the proximal right femur. No obvious fracture of the pelvis or proximal left femur seen in single frontal view. No fracture or dislocation of the distal right femur. IMPRESSION: 1. There is a slightly impacted and foreshortened intertrochanteric fracture of the proximal right femur. 2.  No fracture or dislocation of the distal right femur. Electronically Signed   By: Eddie Candle M.D.   On: 01/30/2020 13:15    Labs: BNP (last 3 results) No results for input(s): BNP in the last 8760 hours. Basic Metabolic Panel: Recent Labs  Lab 01/30/20 1117 02/01/20 0256  NA 138 141  K 4.2 3.9  CL 102 107  CO2 25 26  GLUCOSE 142* 97  BUN 23 24*  CREATININE 0.80 0.86  CALCIUM 8.8* 8.8*   Liver Function Tests: No results for input(s): AST, ALT, ALKPHOS, BILITOT, PROT, ALBUMIN in the last 168 hours. No results for input(s): LIPASE,  AMYLASE in the last 168 hours. No results for input(s): AMMONIA in the last 168 hours. CBC: Recent Labs  Lab 01/30/20 1117 02/01/20 0256  WBC 14.1* 8.3  NEUTROABS 11.5*  --   HGB 14.4 11.0*  HCT 44.9 34.8*  MCV 92.8 94.1  PLT 312 261   Cardiac Enzymes: No results for input(s): CKTOTAL, CKMB, CKMBINDEX, TROPONINI in the last 168 hours. BNP: Invalid input(s): POCBNP CBG: Recent Labs  Lab 01/31/20 2356 02/01/20 0826 02/01/20 1607 02/01/20 2355 02/02/20 0030  GLUCAP 120* 99 90 105* 71   D-Dimer No results for input(s): DDIMER in the last 72 hours. Hgb A1c No results for input(s): HGBA1C in the last 72 hours. Lipid Profile No results for input(s): CHOL, HDL, LDLCALC, TRIG, CHOLHDL, LDLDIRECT in the last 72 hours. Thyroid function studies No results for input(s): TSH, T4TOTAL, T3FREE, THYROIDAB in the last 72 hours.  Invalid input(s): FREET3 Anemia work up No results for input(s): VITAMINB12, FOLATE, FERRITIN, TIBC, IRON, RETICCTPCT in the last 72 hours. Urinalysis No results found for: COLORURINE, APPEARANCEUR, [place], [place], [place], [place], [place], [place], PROTEINUR, UROBILINOGEN, NITRITE, LEUKOCYTESUR Sepsis Labs Invalid input(s): PROCALCITONIN,  WBC,  LACTICIDVEN Microbiology Recent Results (from the past 240 hour(s))  SARS Coronavirus 2 by RT PCR (hospital order, performed in [facility] hospital lab) Nasopharyngeal Nasopharyngeal Swab     Status: None   Collection Time: 01/30/20 11:17 AM   Specimen: Nasopharyngeal Swab  Result Value Ref Range Status   SARS Coronavirus 2 NEGATIVE NEGATIVE Final    Comment: (NOTE) SARS-CoV-2 target nucleic acids are NOT DETECTED.  The SARS-CoV-2 RNA is generally detectable in upper and lower respiratory specimens during the acute phase of infection. The lowest concentration of SARS-CoV-2 viral copies this assay can detect is 250 copies / mL. A negative result does not preclude SARS-CoV-2 infection and should not be  used as the sole basis for treatment or other patient management decisions.  A negative result may occur with improper specimen collection / handling, submission of specimen other than nasopharyngeal swab, presence of viral mutation(s) within the areas targeted by this assay, and inadequate number of viral copies (<250 copies / mL). A negative result must be combined with clinical observations, patient history, and epidemiological information.  Fact Sheet for Patients:   StrictlyIdeas.no  Fact Sheet  for Healthcare Providers: BankingDealers.co.za  This test is not yet approved or  cleared by the [place] and has been authorized for detection and/or diagnosis of SARS-CoV-2 by FDA under an Emergency Use Authorization (EUA).  This EUA will remain in effect (meaning this test can be used) for the duration of the COVID-19 declaration under Section 564(b)(1) of the Act, 21 U.S.C. section 360bbb-3(b)(1), unless the authorization is terminated or revoked sooner.  Performed at [facility], [place] [address], [place], [place] [address]   Surgical pcr screen     Status: Abnormal   Collection Time: 01/30/20  6:01 PM   Specimen: Nasal Mucosa; Nasal Swab  Result Value Ref Range Status   MRSA, PCR NEGATIVE NEGATIVE Final   Staphylococcus aureus POSITIVE (A) NEGATIVE Final    Comment: (NOTE) The Xpert SA Assay (FDA approved for NASAL specimens in patients 67 years of age and older), is one component of a comprehensive surveillance program. It is not intended to diagnose infection nor to guide or monitor treatment. Performed at [facility], [place] [address] [place], [place] [address]      Time coordinating discharge: 35  minutes  SIGNED: Antonieta Pert, MD  [facility] 02/02/2020, 11:07 AM  If 7PM-7AM, please contact night-coverage [email or web address]

## 2020-02-02 NOTE — Progress Notes (Signed)
Report given to Quillian Quince, the receiving nurse at [facility]. Pt waiting on PTAR for transport.

## 2020-02-02 NOTE — TOC Transition Note (Signed)
Transition of Care [facility]) - CM/SW Discharge Note   Patient Details  Name: Olivia Phillips MRN: [id number] Date of Birth: [birth year]  [facility]) CM/SW Contact:  Lennart Pall, LCSW Phone Number: 02/02/2020, 11:35 AM   Clinical Narrative:    Pt medically cleared for tf to SNF today.  Pt and sister have accepted bed offer from [facility].  MD/ RN aware and will arrange for PTAR for transport.  No further TOC needs.   Final next level of care: Skilled Nursing Facility Barriers to Discharge: Barriers Resolved   Patient Goals and CMS Choice   CMS Medicare.gov Compare Post Acute Care list provided to:: Patient Represenative (must comment) Choice offered to / list presented to : Adult Children  Discharge Placement              Patient [facility] bed at: [facility] Patient to be transferred to facility by: [place] Name of family member notified: son, [name] and sister, Payton Emerald Patient and family notified of of transfer: 02/02/20  Discharge Plan and Services In-house Referral: Clinical Social Work   Post Acute Care Choice: [place]          DME Arranged: N/A DME Agency: NA       HH Arranged: NA HH Agency: NA        Social Determinants of Health (SDOH) Interventions     Readmission Risk Interventions Readmission Risk Prevention Plan 02/01/2020  Medication Screening Complete  Transportation Screening Complete  Some recent data might be hidden

## 2020-02-03 ENCOUNTER — Encounter (HOSPITAL_COMMUNITY): Payer: Self-pay | Admitting: Orthopaedic Surgery

## 2020-02-03 DIAGNOSIS — E568 Deficiency of other vitamins: Secondary | ICD-10-CM | POA: Diagnosis not present

## 2020-02-03 DIAGNOSIS — F0280 Dementia in other diseases classified elsewhere without behavioral disturbance: Secondary | ICD-10-CM | POA: Diagnosis not present

## 2020-02-03 DIAGNOSIS — E785 Hyperlipidemia, unspecified: Secondary | ICD-10-CM | POA: Diagnosis not present

## 2020-02-03 DIAGNOSIS — S7291XA Unspecified fracture of right femur, initial encounter for closed fracture: Secondary | ICD-10-CM | POA: Diagnosis not present

## 2020-02-03 DIAGNOSIS — R262 Difficulty in walking, not elsewhere classified: Secondary | ICD-10-CM | POA: Diagnosis not present

## 2020-02-03 DIAGNOSIS — D72829 Elevated white blood cell count, unspecified: Secondary | ICD-10-CM | POA: Diagnosis not present

## 2020-02-03 DIAGNOSIS — F17200 Nicotine dependence, unspecified, uncomplicated: Secondary | ICD-10-CM | POA: Diagnosis not present

## 2020-02-10 DIAGNOSIS — Z20822 Contact with and (suspected) exposure to covid-19: Secondary | ICD-10-CM | POA: Diagnosis not present

## 2020-02-12 DIAGNOSIS — F0280 Dementia in other diseases classified elsewhere without behavioral disturbance: Secondary | ICD-10-CM | POA: Diagnosis not present

## 2020-02-12 DIAGNOSIS — R262 Difficulty in walking, not elsewhere classified: Secondary | ICD-10-CM | POA: Diagnosis not present

## 2020-02-12 DIAGNOSIS — S7291XD Unspecified fracture of right femur, subsequent encounter for closed fracture with routine healing: Secondary | ICD-10-CM | POA: Diagnosis not present

## 2020-02-12 DIAGNOSIS — D473 Essential (hemorrhagic) thrombocythemia: Secondary | ICD-10-CM | POA: Diagnosis not present

## 2020-02-16 ENCOUNTER — Encounter: Payer: Self-pay | Admitting: Physician Assistant

## 2020-02-16 ENCOUNTER — Ambulatory Visit (INDEPENDENT_AMBULATORY_CARE_PROVIDER_SITE_OTHER): Payer: Medicare HMO

## 2020-02-16 ENCOUNTER — Ambulatory Visit (INDEPENDENT_AMBULATORY_CARE_PROVIDER_SITE_OTHER): Payer: Medicare HMO | Admitting: Physician Assistant

## 2020-02-16 DIAGNOSIS — S72141A Displaced intertrochanteric fracture of right femur, initial encounter for closed fracture: Secondary | ICD-10-CM

## 2020-02-16 NOTE — Progress Notes (Signed)
Office Visit Note   Patient: Olivia Phillips           Date of Birth: June 30, 1940           MRN: [id number] Visit Date: 02/16/2020              Requested by: Olivia Pretty, MD [address] [place],  [place] [address] PCP: Olivia Pretty, MD   Assessment & Plan: Visit Diagnoses:  1. Closed displaced intertrochanteric fracture of right femur, initial encounter ([place])     Plan: Noted since weightbearing as tolerated right hip.  She is able to vehicle incision wet in shower.  We will see her back we will see her back in 1 month for repeat AP and lateral views of the right hip.  Questions were encouraged and answered of family members present today.  Follow-Up Instructions: Return in about 4 weeks (around 03/15/2020), or Dr. Ninfa Phillips, for Radiographs.   Orders:  Orders Placed This Encounter  Procedures  . XR FEMUR, MIN 2 VIEWS RIGHT   No orders of the defined types were placed in this encounter.     Procedures: No procedures performed   Clinical Data: No additional findings.   Subjective: Chief Complaint  Patient presents with  . Right Hip - Routine Post Op    HPI [name] is an 79 year old female with dementia.  She is in a skilled nursing facility.  Follows up today status post IM nail right hip intertrochanteric fracture.  Family member presents with her today no notes on her progress with therapy. Review of Systems Unable to review due to dementia  Objective: Vital Signs: There were no vitals taken for this visit.  Physical Exam Constitutional:      Appearance: She is not ill-appearing or diaphoretic.  Pulmonary:     Effort: Pulmonary effort is normal.     Ortho Exam Right hip decreased internal and external rotation but fluid motion otherwise.  Calf supple nontender.  Right foot dorsiflexion plantarflexion intact. Specialty Comments:  No specialty comments available.  Imaging: XR  FEMUR, MIN 2 VIEWS RIGHT  Result Date: 02/16/2020 AP lateral views right femur: Right hip well located.  Status post IM nailing right hip intertrochanteric fracture with overall good position alignment.  No hardware failure.  No bony abnormalities otherwise.    PMFS History: Patient Active Problem List   Diagnosis Date Noted  . Right femoral fracture ([place]) 01/30/2020  . Leukocytosis 01/30/2020  . Elevated blood pressure reading 01/30/2020  . DNR (do not resuscitate) 01/30/2020  . Hyperlipidemia 03/15/2016  . Tobacco abuse 03/15/2016  . Syncope 03/15/2016   Past Medical History:  Diagnosis Date  . Chronic low back pain   . Cluster headache   . COPD (chronic obstructive pulmonary disease) ([place])   . CRF (chronic renal failure)   . GERD (gastroesophageal reflux disease)   . Hyperlipidemia   . Syncopal episodes   . Tobacco use     Family History  Problem Relation Age of Onset  . Alzheimer's disease Mother   . Lung cancer Son     Past Surgical History:  Procedure Laterality Date  . INTRAMEDULLARY (IM) NAIL INTERTROCHANTERIC Right 01/31/2020   Procedure: INTRAMEDULLARY (IM) NAIL INTERTROCHANTRIC;  Surgeon: Mcarthur Rossetti, MD;  Location: [facility];  Service: Orthopedics;  Laterality: Right;   Social  History   Occupational History  . Not on file  Tobacco Use  . Smoking status: Former Research scientist (life sciences)  . Smokeless tobacco: Never Used  Substance and Sexual Activity  . Alcohol use: No  . Drug use: No  . Sexual activity: Not on file

## 2020-02-24 DIAGNOSIS — D473 Essential (hemorrhagic) thrombocythemia: Secondary | ICD-10-CM | POA: Diagnosis not present

## 2020-02-24 DIAGNOSIS — S7291XD Unspecified fracture of right femur, subsequent encounter for closed fracture with routine healing: Secondary | ICD-10-CM | POA: Diagnosis not present

## 2020-02-24 DIAGNOSIS — E568 Deficiency of other vitamins: Secondary | ICD-10-CM | POA: Diagnosis not present

## 2020-02-24 DIAGNOSIS — F0280 Dementia in other diseases classified elsewhere without behavioral disturbance: Secondary | ICD-10-CM | POA: Diagnosis not present

## 2020-02-24 DIAGNOSIS — R262 Difficulty in walking, not elsewhere classified: Secondary | ICD-10-CM | POA: Diagnosis not present

## 2020-02-24 DIAGNOSIS — D72828 Other elevated white blood cell count: Secondary | ICD-10-CM | POA: Diagnosis not present

## 2020-03-05 DIAGNOSIS — S7291XS Unspecified fracture of right femur, sequela: Secondary | ICD-10-CM | POA: Diagnosis not present

## 2020-03-05 DIAGNOSIS — Z4789 Encounter for other orthopedic aftercare: Secondary | ICD-10-CM | POA: Diagnosis not present

## 2020-03-05 DIAGNOSIS — R1312 Dysphagia, oropharyngeal phase: Secondary | ICD-10-CM | POA: Diagnosis not present

## 2020-03-05 DIAGNOSIS — R262 Difficulty in walking, not elsewhere classified: Secondary | ICD-10-CM | POA: Diagnosis not present

## 2020-03-05 DIAGNOSIS — R41841 Cognitive communication deficit: Secondary | ICD-10-CM | POA: Diagnosis not present

## 2020-03-05 DIAGNOSIS — M6281 Muscle weakness (generalized): Secondary | ICD-10-CM | POA: Diagnosis not present

## 2020-03-09 ENCOUNTER — Other Ambulatory Visit: Payer: Self-pay

## 2020-03-09 DIAGNOSIS — U071 COVID-19: Secondary | ICD-10-CM | POA: Diagnosis not present

## 2020-03-09 NOTE — Patient Outreach (Signed)
[place] [facility] : [facility]) Care Management  03/09/2020  Olivia Phillips [birth year] [id number]   Referral Date: 03/09/20 Referral Source: [employer] Report Date of Discharge: 03/07/20 Facility:  [place]: [facility]   Referral received.  No outreach warranted at this time.  Transition of Care calls being completed via EMMI. RN CM will outreach patient for any red flags received.    Plan: RN CM will close case.    Jone Baseman, RN, MSN [facility] Care Management Care Management Coordinator Direct Line [phone] Toll Free: [phone]  Fax: [phone]

## 2020-03-11 DIAGNOSIS — U071 COVID-19: Secondary | ICD-10-CM | POA: Diagnosis not present

## 2020-03-15 ENCOUNTER — Ambulatory Visit (INDEPENDENT_AMBULATORY_CARE_PROVIDER_SITE_OTHER): Payer: Medicare HMO | Admitting: Orthopaedic Surgery

## 2020-03-15 ENCOUNTER — Encounter: Payer: Self-pay | Admitting: Orthopaedic Surgery

## 2020-03-15 ENCOUNTER — Ambulatory Visit: Payer: Self-pay

## 2020-03-15 DIAGNOSIS — S7291XD Unspecified fracture of right femur, subsequent encounter for closed fracture with routine healing: Secondary | ICD-10-CM

## 2020-03-15 DIAGNOSIS — S72141A Displaced intertrochanteric fracture of right femur, initial encounter for closed fracture: Secondary | ICD-10-CM

## 2020-03-15 DIAGNOSIS — S72141G Displaced intertrochanteric fracture of right femur, subsequent encounter for closed fracture with delayed healing: Secondary | ICD-10-CM

## 2020-03-15 DIAGNOSIS — Z20822 Contact with and (suspected) exposure to covid-19: Secondary | ICD-10-CM | POA: Diagnosis not present

## 2020-03-15 NOTE — Progress Notes (Signed)
The patient is a 79 year old female with dementia who is now 6 weeks out from fixation of a complex right hip intertrochanteric fracture.  She is doing well overall according to her daughter and does not complain of any pain.  She ambulates minimally.  Her dementia prohibits her from following a lot of commands as relates to therapy.  On exam I can easily put her right hip through internal and external rotation and she shows no signs of discomfort.  A low AP pelvis and lateral right hip shows intact hardware from fixation of a trochanteric fracture on the right side.  There is been slight interval healing of this complex fracture.  We will x-ray her one more time in 4 weeks.  Will just have an AP and lateral of the right hip only.  We do not need to see the pelvis or the knee.  All questions and concerns were answered addressed.  The continue limiting her mobility due to her fall risk but she can weight-bear as tolerated.

## 2020-03-22 DIAGNOSIS — U071 COVID-19: Secondary | ICD-10-CM | POA: Diagnosis not present

## 2020-03-25 DIAGNOSIS — U071 COVID-19: Secondary | ICD-10-CM | POA: Diagnosis not present

## 2020-03-25 DIAGNOSIS — W1789XA Other fall from one level to another, initial encounter: Secondary | ICD-10-CM | POA: Diagnosis not present

## 2020-03-25 DIAGNOSIS — R112 Nausea with vomiting, unspecified: Secondary | ICD-10-CM | POA: Diagnosis not present

## 2020-03-25 DIAGNOSIS — S7291XD Unspecified fracture of right femur, subsequent encounter for closed fracture with routine healing: Secondary | ICD-10-CM | POA: Diagnosis not present

## 2020-03-26 DIAGNOSIS — R262 Difficulty in walking, not elsewhere classified: Secondary | ICD-10-CM | POA: Diagnosis not present

## 2020-03-26 DIAGNOSIS — E639 Nutritional deficiency, unspecified: Secondary | ICD-10-CM | POA: Diagnosis not present

## 2020-03-26 DIAGNOSIS — F17201 Nicotine dependence, unspecified, in remission: Secondary | ICD-10-CM | POA: Diagnosis not present

## 2020-03-26 DIAGNOSIS — F0390 Unspecified dementia without behavioral disturbance: Secondary | ICD-10-CM | POA: Diagnosis not present

## 2020-03-29 DIAGNOSIS — R262 Difficulty in walking, not elsewhere classified: Secondary | ICD-10-CM | POA: Diagnosis not present

## 2020-03-29 DIAGNOSIS — R1312 Dysphagia, oropharyngeal phase: Secondary | ICD-10-CM | POA: Diagnosis not present

## 2020-03-29 DIAGNOSIS — R4689 Other symptoms and signs involving appearance and behavior: Secondary | ICD-10-CM | POA: Diagnosis not present

## 2020-03-29 DIAGNOSIS — F0280 Dementia in other diseases classified elsewhere without behavioral disturbance: Secondary | ICD-10-CM | POA: Diagnosis not present

## 2020-03-29 DIAGNOSIS — S7291XS Unspecified fracture of right femur, sequela: Secondary | ICD-10-CM | POA: Diagnosis not present

## 2020-03-29 DIAGNOSIS — R41841 Cognitive communication deficit: Secondary | ICD-10-CM | POA: Diagnosis not present

## 2020-03-29 DIAGNOSIS — M6281 Muscle weakness (generalized): Secondary | ICD-10-CM | POA: Diagnosis not present

## 2020-03-29 DIAGNOSIS — F418 Other specified anxiety disorders: Secondary | ICD-10-CM | POA: Diagnosis not present

## 2020-03-29 DIAGNOSIS — J449 Chronic obstructive pulmonary disease, unspecified: Secondary | ICD-10-CM | POA: Diagnosis not present

## 2020-03-29 DIAGNOSIS — F0391 Unspecified dementia with behavioral disturbance: Secondary | ICD-10-CM | POA: Diagnosis not present

## 2020-03-29 DIAGNOSIS — R278 Other lack of coordination: Secondary | ICD-10-CM | POA: Diagnosis not present

## 2020-03-29 DIAGNOSIS — Z4789 Encounter for other orthopedic aftercare: Secondary | ICD-10-CM | POA: Diagnosis not present

## 2020-03-31 DIAGNOSIS — R278 Other lack of coordination: Secondary | ICD-10-CM | POA: Diagnosis not present

## 2020-03-31 DIAGNOSIS — R1312 Dysphagia, oropharyngeal phase: Secondary | ICD-10-CM | POA: Diagnosis not present

## 2020-03-31 DIAGNOSIS — S7291XS Unspecified fracture of right femur, sequela: Secondary | ICD-10-CM | POA: Diagnosis not present

## 2020-03-31 DIAGNOSIS — R41841 Cognitive communication deficit: Secondary | ICD-10-CM | POA: Diagnosis not present

## 2020-03-31 DIAGNOSIS — M6281 Muscle weakness (generalized): Secondary | ICD-10-CM | POA: Diagnosis not present

## 2020-03-31 DIAGNOSIS — F0280 Dementia in other diseases classified elsewhere without behavioral disturbance: Secondary | ICD-10-CM | POA: Diagnosis not present

## 2020-03-31 DIAGNOSIS — J449 Chronic obstructive pulmonary disease, unspecified: Secondary | ICD-10-CM | POA: Diagnosis not present

## 2020-03-31 DIAGNOSIS — Z4789 Encounter for other orthopedic aftercare: Secondary | ICD-10-CM | POA: Diagnosis not present

## 2020-03-31 DIAGNOSIS — R262 Difficulty in walking, not elsewhere classified: Secondary | ICD-10-CM | POA: Diagnosis not present

## 2020-04-01 DIAGNOSIS — R278 Other lack of coordination: Secondary | ICD-10-CM | POA: Diagnosis not present

## 2020-04-01 DIAGNOSIS — R1312 Dysphagia, oropharyngeal phase: Secondary | ICD-10-CM | POA: Diagnosis not present

## 2020-04-01 DIAGNOSIS — R41841 Cognitive communication deficit: Secondary | ICD-10-CM | POA: Diagnosis not present

## 2020-04-01 DIAGNOSIS — Z4789 Encounter for other orthopedic aftercare: Secondary | ICD-10-CM | POA: Diagnosis not present

## 2020-04-01 DIAGNOSIS — R262 Difficulty in walking, not elsewhere classified: Secondary | ICD-10-CM | POA: Diagnosis not present

## 2020-04-01 DIAGNOSIS — J449 Chronic obstructive pulmonary disease, unspecified: Secondary | ICD-10-CM | POA: Diagnosis not present

## 2020-04-01 DIAGNOSIS — F0280 Dementia in other diseases classified elsewhere without behavioral disturbance: Secondary | ICD-10-CM | POA: Diagnosis not present

## 2020-04-01 DIAGNOSIS — S7291XS Unspecified fracture of right femur, sequela: Secondary | ICD-10-CM | POA: Diagnosis not present

## 2020-04-01 DIAGNOSIS — M6281 Muscle weakness (generalized): Secondary | ICD-10-CM | POA: Diagnosis not present

## 2020-04-02 DIAGNOSIS — M6281 Muscle weakness (generalized): Secondary | ICD-10-CM | POA: Diagnosis not present

## 2020-04-02 DIAGNOSIS — R262 Difficulty in walking, not elsewhere classified: Secondary | ICD-10-CM | POA: Diagnosis not present

## 2020-04-02 DIAGNOSIS — R1312 Dysphagia, oropharyngeal phase: Secondary | ICD-10-CM | POA: Diagnosis not present

## 2020-04-02 DIAGNOSIS — R41841 Cognitive communication deficit: Secondary | ICD-10-CM | POA: Diagnosis not present

## 2020-04-02 DIAGNOSIS — S7291XS Unspecified fracture of right femur, sequela: Secondary | ICD-10-CM | POA: Diagnosis not present

## 2020-04-02 DIAGNOSIS — F0280 Dementia in other diseases classified elsewhere without behavioral disturbance: Secondary | ICD-10-CM | POA: Diagnosis not present

## 2020-04-02 DIAGNOSIS — Z4789 Encounter for other orthopedic aftercare: Secondary | ICD-10-CM | POA: Diagnosis not present

## 2020-04-02 DIAGNOSIS — R278 Other lack of coordination: Secondary | ICD-10-CM | POA: Diagnosis not present

## 2020-04-02 DIAGNOSIS — J449 Chronic obstructive pulmonary disease, unspecified: Secondary | ICD-10-CM | POA: Diagnosis not present

## 2020-04-03 DIAGNOSIS — Z4789 Encounter for other orthopedic aftercare: Secondary | ICD-10-CM | POA: Diagnosis not present

## 2020-04-03 DIAGNOSIS — R41841 Cognitive communication deficit: Secondary | ICD-10-CM | POA: Diagnosis not present

## 2020-04-03 DIAGNOSIS — R278 Other lack of coordination: Secondary | ICD-10-CM | POA: Diagnosis not present

## 2020-04-03 DIAGNOSIS — S7291XS Unspecified fracture of right femur, sequela: Secondary | ICD-10-CM | POA: Diagnosis not present

## 2020-04-03 DIAGNOSIS — M6281 Muscle weakness (generalized): Secondary | ICD-10-CM | POA: Diagnosis not present

## 2020-04-03 DIAGNOSIS — F0280 Dementia in other diseases classified elsewhere without behavioral disturbance: Secondary | ICD-10-CM | POA: Diagnosis not present

## 2020-04-03 DIAGNOSIS — R262 Difficulty in walking, not elsewhere classified: Secondary | ICD-10-CM | POA: Diagnosis not present

## 2020-04-03 DIAGNOSIS — R1312 Dysphagia, oropharyngeal phase: Secondary | ICD-10-CM | POA: Diagnosis not present

## 2020-04-03 DIAGNOSIS — J449 Chronic obstructive pulmonary disease, unspecified: Secondary | ICD-10-CM | POA: Diagnosis not present

## 2020-04-05 DIAGNOSIS — Z4789 Encounter for other orthopedic aftercare: Secondary | ICD-10-CM | POA: Diagnosis not present

## 2020-04-05 DIAGNOSIS — F0280 Dementia in other diseases classified elsewhere without behavioral disturbance: Secondary | ICD-10-CM | POA: Diagnosis not present

## 2020-04-05 DIAGNOSIS — R1312 Dysphagia, oropharyngeal phase: Secondary | ICD-10-CM | POA: Diagnosis not present

## 2020-04-05 DIAGNOSIS — M6281 Muscle weakness (generalized): Secondary | ICD-10-CM | POA: Diagnosis not present

## 2020-04-05 DIAGNOSIS — R262 Difficulty in walking, not elsewhere classified: Secondary | ICD-10-CM | POA: Diagnosis not present

## 2020-04-05 DIAGNOSIS — R278 Other lack of coordination: Secondary | ICD-10-CM | POA: Diagnosis not present

## 2020-04-05 DIAGNOSIS — R41841 Cognitive communication deficit: Secondary | ICD-10-CM | POA: Diagnosis not present

## 2020-04-05 DIAGNOSIS — S7291XS Unspecified fracture of right femur, sequela: Secondary | ICD-10-CM | POA: Diagnosis not present

## 2020-04-05 DIAGNOSIS — J449 Chronic obstructive pulmonary disease, unspecified: Secondary | ICD-10-CM | POA: Diagnosis not present

## 2020-04-06 DIAGNOSIS — F0280 Dementia in other diseases classified elsewhere without behavioral disturbance: Secondary | ICD-10-CM | POA: Diagnosis not present

## 2020-04-06 DIAGNOSIS — R262 Difficulty in walking, not elsewhere classified: Secondary | ICD-10-CM | POA: Diagnosis not present

## 2020-04-06 DIAGNOSIS — S7291XS Unspecified fracture of right femur, sequela: Secondary | ICD-10-CM | POA: Diagnosis not present

## 2020-04-06 DIAGNOSIS — R278 Other lack of coordination: Secondary | ICD-10-CM | POA: Diagnosis not present

## 2020-04-06 DIAGNOSIS — Z4789 Encounter for other orthopedic aftercare: Secondary | ICD-10-CM | POA: Diagnosis not present

## 2020-04-06 DIAGNOSIS — M6281 Muscle weakness (generalized): Secondary | ICD-10-CM | POA: Diagnosis not present

## 2020-04-06 DIAGNOSIS — R1312 Dysphagia, oropharyngeal phase: Secondary | ICD-10-CM | POA: Diagnosis not present

## 2020-04-06 DIAGNOSIS — J449 Chronic obstructive pulmonary disease, unspecified: Secondary | ICD-10-CM | POA: Diagnosis not present

## 2020-04-06 DIAGNOSIS — R41841 Cognitive communication deficit: Secondary | ICD-10-CM | POA: Diagnosis not present

## 2020-04-07 DIAGNOSIS — R278 Other lack of coordination: Secondary | ICD-10-CM | POA: Diagnosis not present

## 2020-04-07 DIAGNOSIS — M6281 Muscle weakness (generalized): Secondary | ICD-10-CM | POA: Diagnosis not present

## 2020-04-07 DIAGNOSIS — R41841 Cognitive communication deficit: Secondary | ICD-10-CM | POA: Diagnosis not present

## 2020-04-07 DIAGNOSIS — F0280 Dementia in other diseases classified elsewhere without behavioral disturbance: Secondary | ICD-10-CM | POA: Diagnosis not present

## 2020-04-07 DIAGNOSIS — R1312 Dysphagia, oropharyngeal phase: Secondary | ICD-10-CM | POA: Diagnosis not present

## 2020-04-07 DIAGNOSIS — J449 Chronic obstructive pulmonary disease, unspecified: Secondary | ICD-10-CM | POA: Diagnosis not present

## 2020-04-07 DIAGNOSIS — R262 Difficulty in walking, not elsewhere classified: Secondary | ICD-10-CM | POA: Diagnosis not present

## 2020-04-07 DIAGNOSIS — S7291XS Unspecified fracture of right femur, sequela: Secondary | ICD-10-CM | POA: Diagnosis not present

## 2020-04-07 DIAGNOSIS — Z4789 Encounter for other orthopedic aftercare: Secondary | ICD-10-CM | POA: Diagnosis not present

## 2020-04-08 DIAGNOSIS — R41841 Cognitive communication deficit: Secondary | ICD-10-CM | POA: Diagnosis not present

## 2020-04-08 DIAGNOSIS — R1312 Dysphagia, oropharyngeal phase: Secondary | ICD-10-CM | POA: Diagnosis not present

## 2020-04-08 DIAGNOSIS — Z4789 Encounter for other orthopedic aftercare: Secondary | ICD-10-CM | POA: Diagnosis not present

## 2020-04-08 DIAGNOSIS — S7291XS Unspecified fracture of right femur, sequela: Secondary | ICD-10-CM | POA: Diagnosis not present

## 2020-04-08 DIAGNOSIS — J449 Chronic obstructive pulmonary disease, unspecified: Secondary | ICD-10-CM | POA: Diagnosis not present

## 2020-04-08 DIAGNOSIS — M6281 Muscle weakness (generalized): Secondary | ICD-10-CM | POA: Diagnosis not present

## 2020-04-08 DIAGNOSIS — R278 Other lack of coordination: Secondary | ICD-10-CM | POA: Diagnosis not present

## 2020-04-08 DIAGNOSIS — F0280 Dementia in other diseases classified elsewhere without behavioral disturbance: Secondary | ICD-10-CM | POA: Diagnosis not present

## 2020-04-08 DIAGNOSIS — R262 Difficulty in walking, not elsewhere classified: Secondary | ICD-10-CM | POA: Diagnosis not present

## 2020-04-09 DIAGNOSIS — R41841 Cognitive communication deficit: Secondary | ICD-10-CM | POA: Diagnosis not present

## 2020-04-09 DIAGNOSIS — R278 Other lack of coordination: Secondary | ICD-10-CM | POA: Diagnosis not present

## 2020-04-09 DIAGNOSIS — S7291XS Unspecified fracture of right femur, sequela: Secondary | ICD-10-CM | POA: Diagnosis not present

## 2020-04-09 DIAGNOSIS — M6281 Muscle weakness (generalized): Secondary | ICD-10-CM | POA: Diagnosis not present

## 2020-04-09 DIAGNOSIS — Z4789 Encounter for other orthopedic aftercare: Secondary | ICD-10-CM | POA: Diagnosis not present

## 2020-04-09 DIAGNOSIS — J449 Chronic obstructive pulmonary disease, unspecified: Secondary | ICD-10-CM | POA: Diagnosis not present

## 2020-04-09 DIAGNOSIS — F0280 Dementia in other diseases classified elsewhere without behavioral disturbance: Secondary | ICD-10-CM | POA: Diagnosis not present

## 2020-04-09 DIAGNOSIS — R262 Difficulty in walking, not elsewhere classified: Secondary | ICD-10-CM | POA: Diagnosis not present

## 2020-04-09 DIAGNOSIS — R1312 Dysphagia, oropharyngeal phase: Secondary | ICD-10-CM | POA: Diagnosis not present

## 2020-04-12 ENCOUNTER — Encounter: Payer: Self-pay | Admitting: Orthopaedic Surgery

## 2020-04-12 ENCOUNTER — Ambulatory Visit (INDEPENDENT_AMBULATORY_CARE_PROVIDER_SITE_OTHER): Payer: Medicare HMO | Admitting: Orthopaedic Surgery

## 2020-04-12 ENCOUNTER — Ambulatory Visit (INDEPENDENT_AMBULATORY_CARE_PROVIDER_SITE_OTHER): Payer: Medicare HMO

## 2020-04-12 DIAGNOSIS — S7291XS Unspecified fracture of right femur, sequela: Secondary | ICD-10-CM | POA: Diagnosis not present

## 2020-04-12 DIAGNOSIS — J449 Chronic obstructive pulmonary disease, unspecified: Secondary | ICD-10-CM | POA: Diagnosis not present

## 2020-04-12 DIAGNOSIS — F0280 Dementia in other diseases classified elsewhere without behavioral disturbance: Secondary | ICD-10-CM | POA: Diagnosis not present

## 2020-04-12 DIAGNOSIS — M6281 Muscle weakness (generalized): Secondary | ICD-10-CM | POA: Diagnosis not present

## 2020-04-12 DIAGNOSIS — S7291XD Unspecified fracture of right femur, subsequent encounter for closed fracture with routine healing: Secondary | ICD-10-CM

## 2020-04-12 DIAGNOSIS — R41841 Cognitive communication deficit: Secondary | ICD-10-CM | POA: Diagnosis not present

## 2020-04-12 DIAGNOSIS — R262 Difficulty in walking, not elsewhere classified: Secondary | ICD-10-CM | POA: Diagnosis not present

## 2020-04-12 DIAGNOSIS — R1312 Dysphagia, oropharyngeal phase: Secondary | ICD-10-CM | POA: Diagnosis not present

## 2020-04-12 DIAGNOSIS — R278 Other lack of coordination: Secondary | ICD-10-CM | POA: Diagnosis not present

## 2020-04-12 DIAGNOSIS — S72141G Displaced intertrochanteric fracture of right femur, subsequent encounter for closed fracture with delayed healing: Secondary | ICD-10-CM

## 2020-04-12 DIAGNOSIS — Z4789 Encounter for other orthopedic aftercare: Secondary | ICD-10-CM | POA: Diagnosis not present

## 2020-04-12 NOTE — Progress Notes (Signed)
The patient is now 10 weeks status post open reduction/internal fixation of a right hip intertrochanteric fracture.  She mainly gets around in a wheelchair with weightbearing as tolerated.  She is a fall risk.  She also has significant dementia.  Her daughter is with her today.  I easily put her right operative hip through internal and external rotation with no difficulty at all.  X-rays of the right hip show the fracture is healed and there is no complicating features of the hardware.  She will continue her activities as tolerated as before at her nursing center.  All questions and concerns were answered and addressed.  She should mainly get around in just a wheelchair due to her being a significant fall risk.  Weightbearing still can be as tolerated.  Follow-up is as needed.

## 2020-04-13 DIAGNOSIS — J449 Chronic obstructive pulmonary disease, unspecified: Secondary | ICD-10-CM | POA: Diagnosis not present

## 2020-04-13 DIAGNOSIS — S7291XS Unspecified fracture of right femur, sequela: Secondary | ICD-10-CM | POA: Diagnosis not present

## 2020-04-13 DIAGNOSIS — R1312 Dysphagia, oropharyngeal phase: Secondary | ICD-10-CM | POA: Diagnosis not present

## 2020-04-13 DIAGNOSIS — F0280 Dementia in other diseases classified elsewhere without behavioral disturbance: Secondary | ICD-10-CM | POA: Diagnosis not present

## 2020-04-13 DIAGNOSIS — R41841 Cognitive communication deficit: Secondary | ICD-10-CM | POA: Diagnosis not present

## 2020-04-13 DIAGNOSIS — M6281 Muscle weakness (generalized): Secondary | ICD-10-CM | POA: Diagnosis not present

## 2020-04-13 DIAGNOSIS — R278 Other lack of coordination: Secondary | ICD-10-CM | POA: Diagnosis not present

## 2020-04-13 DIAGNOSIS — Z4789 Encounter for other orthopedic aftercare: Secondary | ICD-10-CM | POA: Diagnosis not present

## 2020-04-13 DIAGNOSIS — R262 Difficulty in walking, not elsewhere classified: Secondary | ICD-10-CM | POA: Diagnosis not present

## 2020-04-14 DIAGNOSIS — R41841 Cognitive communication deficit: Secondary | ICD-10-CM | POA: Diagnosis not present

## 2020-04-14 DIAGNOSIS — R278 Other lack of coordination: Secondary | ICD-10-CM | POA: Diagnosis not present

## 2020-04-14 DIAGNOSIS — S7291XS Unspecified fracture of right femur, sequela: Secondary | ICD-10-CM | POA: Diagnosis not present

## 2020-04-14 DIAGNOSIS — Z4789 Encounter for other orthopedic aftercare: Secondary | ICD-10-CM | POA: Diagnosis not present

## 2020-04-14 DIAGNOSIS — J449 Chronic obstructive pulmonary disease, unspecified: Secondary | ICD-10-CM | POA: Diagnosis not present

## 2020-04-14 DIAGNOSIS — M6281 Muscle weakness (generalized): Secondary | ICD-10-CM | POA: Diagnosis not present

## 2020-04-14 DIAGNOSIS — R1312 Dysphagia, oropharyngeal phase: Secondary | ICD-10-CM | POA: Diagnosis not present

## 2020-04-14 DIAGNOSIS — R262 Difficulty in walking, not elsewhere classified: Secondary | ICD-10-CM | POA: Diagnosis not present

## 2020-04-14 DIAGNOSIS — F0280 Dementia in other diseases classified elsewhere without behavioral disturbance: Secondary | ICD-10-CM | POA: Diagnosis not present

## 2020-04-15 DIAGNOSIS — R41841 Cognitive communication deficit: Secondary | ICD-10-CM | POA: Diagnosis not present

## 2020-04-15 DIAGNOSIS — M6281 Muscle weakness (generalized): Secondary | ICD-10-CM | POA: Diagnosis not present

## 2020-04-15 DIAGNOSIS — R278 Other lack of coordination: Secondary | ICD-10-CM | POA: Diagnosis not present

## 2020-04-15 DIAGNOSIS — F0280 Dementia in other diseases classified elsewhere without behavioral disturbance: Secondary | ICD-10-CM | POA: Diagnosis not present

## 2020-04-15 DIAGNOSIS — J449 Chronic obstructive pulmonary disease, unspecified: Secondary | ICD-10-CM | POA: Diagnosis not present

## 2020-04-15 DIAGNOSIS — R262 Difficulty in walking, not elsewhere classified: Secondary | ICD-10-CM | POA: Diagnosis not present

## 2020-04-15 DIAGNOSIS — R1312 Dysphagia, oropharyngeal phase: Secondary | ICD-10-CM | POA: Diagnosis not present

## 2020-04-15 DIAGNOSIS — Z4789 Encounter for other orthopedic aftercare: Secondary | ICD-10-CM | POA: Diagnosis not present

## 2020-04-15 DIAGNOSIS — S7291XS Unspecified fracture of right femur, sequela: Secondary | ICD-10-CM | POA: Diagnosis not present

## 2020-04-16 DIAGNOSIS — S7291XS Unspecified fracture of right femur, sequela: Secondary | ICD-10-CM | POA: Diagnosis not present

## 2020-04-16 DIAGNOSIS — R262 Difficulty in walking, not elsewhere classified: Secondary | ICD-10-CM | POA: Diagnosis not present

## 2020-04-16 DIAGNOSIS — R41841 Cognitive communication deficit: Secondary | ICD-10-CM | POA: Diagnosis not present

## 2020-04-16 DIAGNOSIS — F0280 Dementia in other diseases classified elsewhere without behavioral disturbance: Secondary | ICD-10-CM | POA: Diagnosis not present

## 2020-04-16 DIAGNOSIS — R278 Other lack of coordination: Secondary | ICD-10-CM | POA: Diagnosis not present

## 2020-04-16 DIAGNOSIS — J449 Chronic obstructive pulmonary disease, unspecified: Secondary | ICD-10-CM | POA: Diagnosis not present

## 2020-04-16 DIAGNOSIS — Z4789 Encounter for other orthopedic aftercare: Secondary | ICD-10-CM | POA: Diagnosis not present

## 2020-04-16 DIAGNOSIS — R1312 Dysphagia, oropharyngeal phase: Secondary | ICD-10-CM | POA: Diagnosis not present

## 2020-04-16 DIAGNOSIS — M6281 Muscle weakness (generalized): Secondary | ICD-10-CM | POA: Diagnosis not present

## 2020-04-19 DIAGNOSIS — J449 Chronic obstructive pulmonary disease, unspecified: Secondary | ICD-10-CM | POA: Diagnosis not present

## 2020-04-19 DIAGNOSIS — R262 Difficulty in walking, not elsewhere classified: Secondary | ICD-10-CM | POA: Diagnosis not present

## 2020-04-19 DIAGNOSIS — R41841 Cognitive communication deficit: Secondary | ICD-10-CM | POA: Diagnosis not present

## 2020-04-19 DIAGNOSIS — R1312 Dysphagia, oropharyngeal phase: Secondary | ICD-10-CM | POA: Diagnosis not present

## 2020-04-19 DIAGNOSIS — F0280 Dementia in other diseases classified elsewhere without behavioral disturbance: Secondary | ICD-10-CM | POA: Diagnosis not present

## 2020-04-19 DIAGNOSIS — M6281 Muscle weakness (generalized): Secondary | ICD-10-CM | POA: Diagnosis not present

## 2020-04-19 DIAGNOSIS — S7291XS Unspecified fracture of right femur, sequela: Secondary | ICD-10-CM | POA: Diagnosis not present

## 2020-04-19 DIAGNOSIS — Z4789 Encounter for other orthopedic aftercare: Secondary | ICD-10-CM | POA: Diagnosis not present

## 2020-04-19 DIAGNOSIS — R278 Other lack of coordination: Secondary | ICD-10-CM | POA: Diagnosis not present

## 2020-04-20 DIAGNOSIS — J449 Chronic obstructive pulmonary disease, unspecified: Secondary | ICD-10-CM | POA: Diagnosis not present

## 2020-04-20 DIAGNOSIS — R278 Other lack of coordination: Secondary | ICD-10-CM | POA: Diagnosis not present

## 2020-04-20 DIAGNOSIS — R262 Difficulty in walking, not elsewhere classified: Secondary | ICD-10-CM | POA: Diagnosis not present

## 2020-04-20 DIAGNOSIS — R41841 Cognitive communication deficit: Secondary | ICD-10-CM | POA: Diagnosis not present

## 2020-04-20 DIAGNOSIS — S7291XS Unspecified fracture of right femur, sequela: Secondary | ICD-10-CM | POA: Diagnosis not present

## 2020-04-20 DIAGNOSIS — F0280 Dementia in other diseases classified elsewhere without behavioral disturbance: Secondary | ICD-10-CM | POA: Diagnosis not present

## 2020-04-20 DIAGNOSIS — R1312 Dysphagia, oropharyngeal phase: Secondary | ICD-10-CM | POA: Diagnosis not present

## 2020-04-20 DIAGNOSIS — M6281 Muscle weakness (generalized): Secondary | ICD-10-CM | POA: Diagnosis not present

## 2020-04-20 DIAGNOSIS — Z4789 Encounter for other orthopedic aftercare: Secondary | ICD-10-CM | POA: Diagnosis not present

## 2020-04-21 DIAGNOSIS — Z4789 Encounter for other orthopedic aftercare: Secondary | ICD-10-CM | POA: Diagnosis not present

## 2020-04-21 DIAGNOSIS — R41841 Cognitive communication deficit: Secondary | ICD-10-CM | POA: Diagnosis not present

## 2020-04-21 DIAGNOSIS — R278 Other lack of coordination: Secondary | ICD-10-CM | POA: Diagnosis not present

## 2020-04-21 DIAGNOSIS — F0280 Dementia in other diseases classified elsewhere without behavioral disturbance: Secondary | ICD-10-CM | POA: Diagnosis not present

## 2020-04-21 DIAGNOSIS — R1312 Dysphagia, oropharyngeal phase: Secondary | ICD-10-CM | POA: Diagnosis not present

## 2020-04-21 DIAGNOSIS — M6281 Muscle weakness (generalized): Secondary | ICD-10-CM | POA: Diagnosis not present

## 2020-04-21 DIAGNOSIS — R262 Difficulty in walking, not elsewhere classified: Secondary | ICD-10-CM | POA: Diagnosis not present

## 2020-04-21 DIAGNOSIS — S7291XS Unspecified fracture of right femur, sequela: Secondary | ICD-10-CM | POA: Diagnosis not present

## 2020-04-21 DIAGNOSIS — J449 Chronic obstructive pulmonary disease, unspecified: Secondary | ICD-10-CM | POA: Diagnosis not present

## 2020-04-22 DIAGNOSIS — F0280 Dementia in other diseases classified elsewhere without behavioral disturbance: Secondary | ICD-10-CM | POA: Diagnosis not present

## 2020-04-22 DIAGNOSIS — F0390 Unspecified dementia without behavioral disturbance: Secondary | ICD-10-CM | POA: Diagnosis not present

## 2020-04-22 DIAGNOSIS — R1312 Dysphagia, oropharyngeal phase: Secondary | ICD-10-CM | POA: Diagnosis not present

## 2020-04-22 DIAGNOSIS — R634 Abnormal weight loss: Secondary | ICD-10-CM | POA: Diagnosis not present

## 2020-04-22 DIAGNOSIS — M6281 Muscle weakness (generalized): Secondary | ICD-10-CM | POA: Diagnosis not present

## 2020-04-22 DIAGNOSIS — R262 Difficulty in walking, not elsewhere classified: Secondary | ICD-10-CM | POA: Diagnosis not present

## 2020-04-22 DIAGNOSIS — F39 Unspecified mood [affective] disorder: Secondary | ICD-10-CM | POA: Diagnosis not present

## 2020-04-22 DIAGNOSIS — J449 Chronic obstructive pulmonary disease, unspecified: Secondary | ICD-10-CM | POA: Diagnosis not present

## 2020-04-22 DIAGNOSIS — S7291XS Unspecified fracture of right femur, sequela: Secondary | ICD-10-CM | POA: Diagnosis not present

## 2020-04-22 DIAGNOSIS — R41841 Cognitive communication deficit: Secondary | ICD-10-CM | POA: Diagnosis not present

## 2020-04-22 DIAGNOSIS — R198 Other specified symptoms and signs involving the digestive system and abdomen: Secondary | ICD-10-CM | POA: Diagnosis not present

## 2020-04-22 DIAGNOSIS — Z4789 Encounter for other orthopedic aftercare: Secondary | ICD-10-CM | POA: Diagnosis not present

## 2020-04-22 DIAGNOSIS — R278 Other lack of coordination: Secondary | ICD-10-CM | POA: Diagnosis not present

## 2020-04-23 DIAGNOSIS — R262 Difficulty in walking, not elsewhere classified: Secondary | ICD-10-CM | POA: Diagnosis not present

## 2020-04-23 DIAGNOSIS — Z4789 Encounter for other orthopedic aftercare: Secondary | ICD-10-CM | POA: Diagnosis not present

## 2020-04-23 DIAGNOSIS — F0280 Dementia in other diseases classified elsewhere without behavioral disturbance: Secondary | ICD-10-CM | POA: Diagnosis not present

## 2020-04-23 DIAGNOSIS — R278 Other lack of coordination: Secondary | ICD-10-CM | POA: Diagnosis not present

## 2020-04-23 DIAGNOSIS — R41841 Cognitive communication deficit: Secondary | ICD-10-CM | POA: Diagnosis not present

## 2020-04-23 DIAGNOSIS — R1312 Dysphagia, oropharyngeal phase: Secondary | ICD-10-CM | POA: Diagnosis not present

## 2020-04-23 DIAGNOSIS — M6281 Muscle weakness (generalized): Secondary | ICD-10-CM | POA: Diagnosis not present

## 2020-04-23 DIAGNOSIS — J449 Chronic obstructive pulmonary disease, unspecified: Secondary | ICD-10-CM | POA: Diagnosis not present

## 2020-04-23 DIAGNOSIS — S7291XS Unspecified fracture of right femur, sequela: Secondary | ICD-10-CM | POA: Diagnosis not present

## 2020-04-26 DIAGNOSIS — F0391 Unspecified dementia with behavioral disturbance: Secondary | ICD-10-CM | POA: Diagnosis not present

## 2020-04-26 DIAGNOSIS — F418 Other specified anxiety disorders: Secondary | ICD-10-CM | POA: Diagnosis not present

## 2020-04-27 DIAGNOSIS — K5909 Other constipation: Secondary | ICD-10-CM | POA: Diagnosis not present

## 2020-04-27 DIAGNOSIS — F3489 Other specified persistent mood disorders: Secondary | ICD-10-CM | POA: Diagnosis not present

## 2020-04-27 DIAGNOSIS — E7849 Other hyperlipidemia: Secondary | ICD-10-CM | POA: Diagnosis not present

## 2020-04-27 DIAGNOSIS — F0280 Dementia in other diseases classified elsewhere without behavioral disturbance: Secondary | ICD-10-CM | POA: Diagnosis not present

## 2020-04-27 DIAGNOSIS — R634 Abnormal weight loss: Secondary | ICD-10-CM | POA: Diagnosis not present

## 2020-05-24 DIAGNOSIS — F418 Other specified anxiety disorders: Secondary | ICD-10-CM | POA: Diagnosis not present

## 2020-05-24 DIAGNOSIS — F0391 Unspecified dementia with behavioral disturbance: Secondary | ICD-10-CM | POA: Diagnosis not present

## 2020-05-24 DIAGNOSIS — R634 Abnormal weight loss: Secondary | ICD-10-CM | POA: Diagnosis not present

## 2020-06-24 DIAGNOSIS — R41841 Cognitive communication deficit: Secondary | ICD-10-CM | POA: Diagnosis not present

## 2020-06-24 DIAGNOSIS — S7291XS Unspecified fracture of right femur, sequela: Secondary | ICD-10-CM | POA: Diagnosis not present

## 2020-06-24 DIAGNOSIS — R262 Difficulty in walking, not elsewhere classified: Secondary | ICD-10-CM | POA: Diagnosis not present

## 2020-06-24 DIAGNOSIS — M6281 Muscle weakness (generalized): Secondary | ICD-10-CM | POA: Diagnosis not present

## 2020-06-24 DIAGNOSIS — F0280 Dementia in other diseases classified elsewhere without behavioral disturbance: Secondary | ICD-10-CM | POA: Diagnosis not present

## 2020-06-24 DIAGNOSIS — K5901 Slow transit constipation: Secondary | ICD-10-CM | POA: Diagnosis not present

## 2020-06-24 DIAGNOSIS — R278 Other lack of coordination: Secondary | ICD-10-CM | POA: Diagnosis not present

## 2020-06-24 DIAGNOSIS — Z4789 Encounter for other orthopedic aftercare: Secondary | ICD-10-CM | POA: Diagnosis not present

## 2020-06-24 DIAGNOSIS — J449 Chronic obstructive pulmonary disease, unspecified: Secondary | ICD-10-CM | POA: Diagnosis not present

## 2020-06-24 DIAGNOSIS — E639 Nutritional deficiency, unspecified: Secondary | ICD-10-CM | POA: Diagnosis not present

## 2020-06-24 DIAGNOSIS — R1312 Dysphagia, oropharyngeal phase: Secondary | ICD-10-CM | POA: Diagnosis not present

## 2020-06-24 DIAGNOSIS — F0390 Unspecified dementia without behavioral disturbance: Secondary | ICD-10-CM | POA: Diagnosis not present

## 2020-06-27 DIAGNOSIS — M6281 Muscle weakness (generalized): Secondary | ICD-10-CM | POA: Diagnosis not present

## 2020-06-27 DIAGNOSIS — R262 Difficulty in walking, not elsewhere classified: Secondary | ICD-10-CM | POA: Diagnosis not present

## 2020-06-27 DIAGNOSIS — R278 Other lack of coordination: Secondary | ICD-10-CM | POA: Diagnosis not present

## 2020-06-27 DIAGNOSIS — R41841 Cognitive communication deficit: Secondary | ICD-10-CM | POA: Diagnosis not present

## 2020-06-27 DIAGNOSIS — J449 Chronic obstructive pulmonary disease, unspecified: Secondary | ICD-10-CM | POA: Diagnosis not present

## 2020-06-27 DIAGNOSIS — S7291XS Unspecified fracture of right femur, sequela: Secondary | ICD-10-CM | POA: Diagnosis not present

## 2020-06-27 DIAGNOSIS — F0280 Dementia in other diseases classified elsewhere without behavioral disturbance: Secondary | ICD-10-CM | POA: Diagnosis not present

## 2020-06-27 DIAGNOSIS — Z4789 Encounter for other orthopedic aftercare: Secondary | ICD-10-CM | POA: Diagnosis not present

## 2020-06-27 DIAGNOSIS — R1312 Dysphagia, oropharyngeal phase: Secondary | ICD-10-CM | POA: Diagnosis not present

## 2020-06-28 DIAGNOSIS — F418 Other specified anxiety disorders: Secondary | ICD-10-CM | POA: Diagnosis not present

## 2020-06-28 DIAGNOSIS — Z4789 Encounter for other orthopedic aftercare: Secondary | ICD-10-CM | POA: Diagnosis not present

## 2020-06-28 DIAGNOSIS — F0280 Dementia in other diseases classified elsewhere without behavioral disturbance: Secondary | ICD-10-CM | POA: Diagnosis not present

## 2020-06-28 DIAGNOSIS — F0391 Unspecified dementia with behavioral disturbance: Secondary | ICD-10-CM | POA: Diagnosis not present

## 2020-06-28 DIAGNOSIS — M6281 Muscle weakness (generalized): Secondary | ICD-10-CM | POA: Diagnosis not present

## 2020-06-28 DIAGNOSIS — J449 Chronic obstructive pulmonary disease, unspecified: Secondary | ICD-10-CM | POA: Diagnosis not present

## 2020-06-28 DIAGNOSIS — R262 Difficulty in walking, not elsewhere classified: Secondary | ICD-10-CM | POA: Diagnosis not present

## 2020-06-28 DIAGNOSIS — R41841 Cognitive communication deficit: Secondary | ICD-10-CM | POA: Diagnosis not present

## 2020-06-28 DIAGNOSIS — R1312 Dysphagia, oropharyngeal phase: Secondary | ICD-10-CM | POA: Diagnosis not present

## 2020-06-28 DIAGNOSIS — R278 Other lack of coordination: Secondary | ICD-10-CM | POA: Diagnosis not present

## 2020-06-28 DIAGNOSIS — S7291XS Unspecified fracture of right femur, sequela: Secondary | ICD-10-CM | POA: Diagnosis not present

## 2020-06-29 DIAGNOSIS — J449 Chronic obstructive pulmonary disease, unspecified: Secondary | ICD-10-CM | POA: Diagnosis not present

## 2020-06-29 DIAGNOSIS — R278 Other lack of coordination: Secondary | ICD-10-CM | POA: Diagnosis not present

## 2020-06-29 DIAGNOSIS — R1312 Dysphagia, oropharyngeal phase: Secondary | ICD-10-CM | POA: Diagnosis not present

## 2020-06-29 DIAGNOSIS — S7291XS Unspecified fracture of right femur, sequela: Secondary | ICD-10-CM | POA: Diagnosis not present

## 2020-06-29 DIAGNOSIS — Z4789 Encounter for other orthopedic aftercare: Secondary | ICD-10-CM | POA: Diagnosis not present

## 2020-06-29 DIAGNOSIS — R262 Difficulty in walking, not elsewhere classified: Secondary | ICD-10-CM | POA: Diagnosis not present

## 2020-06-29 DIAGNOSIS — F0280 Dementia in other diseases classified elsewhere without behavioral disturbance: Secondary | ICD-10-CM | POA: Diagnosis not present

## 2020-06-29 DIAGNOSIS — R41841 Cognitive communication deficit: Secondary | ICD-10-CM | POA: Diagnosis not present

## 2020-06-29 DIAGNOSIS — M6281 Muscle weakness (generalized): Secondary | ICD-10-CM | POA: Diagnosis not present

## 2020-06-30 DIAGNOSIS — R262 Difficulty in walking, not elsewhere classified: Secondary | ICD-10-CM | POA: Diagnosis not present

## 2020-06-30 DIAGNOSIS — J449 Chronic obstructive pulmonary disease, unspecified: Secondary | ICD-10-CM | POA: Diagnosis not present

## 2020-06-30 DIAGNOSIS — M6281 Muscle weakness (generalized): Secondary | ICD-10-CM | POA: Diagnosis not present

## 2020-06-30 DIAGNOSIS — R1312 Dysphagia, oropharyngeal phase: Secondary | ICD-10-CM | POA: Diagnosis not present

## 2020-06-30 DIAGNOSIS — Z4789 Encounter for other orthopedic aftercare: Secondary | ICD-10-CM | POA: Diagnosis not present

## 2020-06-30 DIAGNOSIS — R41841 Cognitive communication deficit: Secondary | ICD-10-CM | POA: Diagnosis not present

## 2020-06-30 DIAGNOSIS — F0280 Dementia in other diseases classified elsewhere without behavioral disturbance: Secondary | ICD-10-CM | POA: Diagnosis not present

## 2020-06-30 DIAGNOSIS — S7291XS Unspecified fracture of right femur, sequela: Secondary | ICD-10-CM | POA: Diagnosis not present

## 2020-06-30 DIAGNOSIS — R278 Other lack of coordination: Secondary | ICD-10-CM | POA: Diagnosis not present

## 2020-07-01 DIAGNOSIS — R1312 Dysphagia, oropharyngeal phase: Secondary | ICD-10-CM | POA: Diagnosis not present

## 2020-07-01 DIAGNOSIS — M6281 Muscle weakness (generalized): Secondary | ICD-10-CM | POA: Diagnosis not present

## 2020-07-01 DIAGNOSIS — S7291XS Unspecified fracture of right femur, sequela: Secondary | ICD-10-CM | POA: Diagnosis not present

## 2020-07-01 DIAGNOSIS — J449 Chronic obstructive pulmonary disease, unspecified: Secondary | ICD-10-CM | POA: Diagnosis not present

## 2020-07-01 DIAGNOSIS — R41841 Cognitive communication deficit: Secondary | ICD-10-CM | POA: Diagnosis not present

## 2020-07-01 DIAGNOSIS — F0280 Dementia in other diseases classified elsewhere without behavioral disturbance: Secondary | ICD-10-CM | POA: Diagnosis not present

## 2020-07-01 DIAGNOSIS — Z4789 Encounter for other orthopedic aftercare: Secondary | ICD-10-CM | POA: Diagnosis not present

## 2020-07-01 DIAGNOSIS — R262 Difficulty in walking, not elsewhere classified: Secondary | ICD-10-CM | POA: Diagnosis not present

## 2020-07-01 DIAGNOSIS — R278 Other lack of coordination: Secondary | ICD-10-CM | POA: Diagnosis not present

## 2020-07-02 DIAGNOSIS — S7291XS Unspecified fracture of right femur, sequela: Secondary | ICD-10-CM | POA: Diagnosis not present

## 2020-07-02 DIAGNOSIS — M6281 Muscle weakness (generalized): Secondary | ICD-10-CM | POA: Diagnosis not present

## 2020-07-02 DIAGNOSIS — J449 Chronic obstructive pulmonary disease, unspecified: Secondary | ICD-10-CM | POA: Diagnosis not present

## 2020-07-02 DIAGNOSIS — R278 Other lack of coordination: Secondary | ICD-10-CM | POA: Diagnosis not present

## 2020-07-02 DIAGNOSIS — R1312 Dysphagia, oropharyngeal phase: Secondary | ICD-10-CM | POA: Diagnosis not present

## 2020-07-02 DIAGNOSIS — R41841 Cognitive communication deficit: Secondary | ICD-10-CM | POA: Diagnosis not present

## 2020-07-02 DIAGNOSIS — F0280 Dementia in other diseases classified elsewhere without behavioral disturbance: Secondary | ICD-10-CM | POA: Diagnosis not present

## 2020-07-02 DIAGNOSIS — R262 Difficulty in walking, not elsewhere classified: Secondary | ICD-10-CM | POA: Diagnosis not present

## 2020-07-02 DIAGNOSIS — Z4789 Encounter for other orthopedic aftercare: Secondary | ICD-10-CM | POA: Diagnosis not present

## 2020-07-03 DIAGNOSIS — J449 Chronic obstructive pulmonary disease, unspecified: Secondary | ICD-10-CM | POA: Diagnosis not present

## 2020-07-03 DIAGNOSIS — R1312 Dysphagia, oropharyngeal phase: Secondary | ICD-10-CM | POA: Diagnosis not present

## 2020-07-03 DIAGNOSIS — R278 Other lack of coordination: Secondary | ICD-10-CM | POA: Diagnosis not present

## 2020-07-03 DIAGNOSIS — S7291XS Unspecified fracture of right femur, sequela: Secondary | ICD-10-CM | POA: Diagnosis not present

## 2020-07-03 DIAGNOSIS — R262 Difficulty in walking, not elsewhere classified: Secondary | ICD-10-CM | POA: Diagnosis not present

## 2020-07-03 DIAGNOSIS — Z4789 Encounter for other orthopedic aftercare: Secondary | ICD-10-CM | POA: Diagnosis not present

## 2020-07-03 DIAGNOSIS — F0280 Dementia in other diseases classified elsewhere without behavioral disturbance: Secondary | ICD-10-CM | POA: Diagnosis not present

## 2020-07-03 DIAGNOSIS — R41841 Cognitive communication deficit: Secondary | ICD-10-CM | POA: Diagnosis not present

## 2020-07-03 DIAGNOSIS — M6281 Muscle weakness (generalized): Secondary | ICD-10-CM | POA: Diagnosis not present

## 2020-07-04 DIAGNOSIS — F0280 Dementia in other diseases classified elsewhere without behavioral disturbance: Secondary | ICD-10-CM | POA: Diagnosis not present

## 2020-07-04 DIAGNOSIS — R1312 Dysphagia, oropharyngeal phase: Secondary | ICD-10-CM | POA: Diagnosis not present

## 2020-07-04 DIAGNOSIS — S7291XS Unspecified fracture of right femur, sequela: Secondary | ICD-10-CM | POA: Diagnosis not present

## 2020-07-04 DIAGNOSIS — Z4789 Encounter for other orthopedic aftercare: Secondary | ICD-10-CM | POA: Diagnosis not present

## 2020-07-04 DIAGNOSIS — J449 Chronic obstructive pulmonary disease, unspecified: Secondary | ICD-10-CM | POA: Diagnosis not present

## 2020-07-04 DIAGNOSIS — R278 Other lack of coordination: Secondary | ICD-10-CM | POA: Diagnosis not present

## 2020-07-04 DIAGNOSIS — R41841 Cognitive communication deficit: Secondary | ICD-10-CM | POA: Diagnosis not present

## 2020-07-04 DIAGNOSIS — R262 Difficulty in walking, not elsewhere classified: Secondary | ICD-10-CM | POA: Diagnosis not present

## 2020-07-04 DIAGNOSIS — M6281 Muscle weakness (generalized): Secondary | ICD-10-CM | POA: Diagnosis not present

## 2020-07-05 DIAGNOSIS — F0280 Dementia in other diseases classified elsewhere without behavioral disturbance: Secondary | ICD-10-CM | POA: Diagnosis not present

## 2020-07-05 DIAGNOSIS — R1312 Dysphagia, oropharyngeal phase: Secondary | ICD-10-CM | POA: Diagnosis not present

## 2020-07-05 DIAGNOSIS — R278 Other lack of coordination: Secondary | ICD-10-CM | POA: Diagnosis not present

## 2020-07-05 DIAGNOSIS — R41841 Cognitive communication deficit: Secondary | ICD-10-CM | POA: Diagnosis not present

## 2020-07-05 DIAGNOSIS — M6281 Muscle weakness (generalized): Secondary | ICD-10-CM | POA: Diagnosis not present

## 2020-07-05 DIAGNOSIS — J449 Chronic obstructive pulmonary disease, unspecified: Secondary | ICD-10-CM | POA: Diagnosis not present

## 2020-07-05 DIAGNOSIS — Z4789 Encounter for other orthopedic aftercare: Secondary | ICD-10-CM | POA: Diagnosis not present

## 2020-07-05 DIAGNOSIS — S7291XS Unspecified fracture of right femur, sequela: Secondary | ICD-10-CM | POA: Diagnosis not present

## 2020-07-05 DIAGNOSIS — R262 Difficulty in walking, not elsewhere classified: Secondary | ICD-10-CM | POA: Diagnosis not present

## 2020-07-06 DIAGNOSIS — R1312 Dysphagia, oropharyngeal phase: Secondary | ICD-10-CM | POA: Diagnosis not present

## 2020-07-06 DIAGNOSIS — R262 Difficulty in walking, not elsewhere classified: Secondary | ICD-10-CM | POA: Diagnosis not present

## 2020-07-06 DIAGNOSIS — R2681 Unsteadiness on feet: Secondary | ICD-10-CM | POA: Diagnosis not present

## 2020-07-06 DIAGNOSIS — R41841 Cognitive communication deficit: Secondary | ICD-10-CM | POA: Diagnosis not present

## 2020-07-06 DIAGNOSIS — F0280 Dementia in other diseases classified elsewhere without behavioral disturbance: Secondary | ICD-10-CM | POA: Diagnosis not present

## 2020-07-06 DIAGNOSIS — J449 Chronic obstructive pulmonary disease, unspecified: Secondary | ICD-10-CM | POA: Diagnosis not present

## 2020-07-06 DIAGNOSIS — S7291XS Unspecified fracture of right femur, sequela: Secondary | ICD-10-CM | POA: Diagnosis not present

## 2020-07-06 DIAGNOSIS — M6281 Muscle weakness (generalized): Secondary | ICD-10-CM | POA: Diagnosis not present

## 2020-07-06 DIAGNOSIS — R278 Other lack of coordination: Secondary | ICD-10-CM | POA: Diagnosis not present

## 2020-07-07 DIAGNOSIS — F0280 Dementia in other diseases classified elsewhere without behavioral disturbance: Secondary | ICD-10-CM | POA: Diagnosis not present

## 2020-07-07 DIAGNOSIS — J984 Other disorders of lung: Secondary | ICD-10-CM | POA: Diagnosis not present

## 2020-07-07 DIAGNOSIS — R41841 Cognitive communication deficit: Secondary | ICD-10-CM | POA: Diagnosis not present

## 2020-07-07 DIAGNOSIS — U071 COVID-19: Secondary | ICD-10-CM | POA: Diagnosis not present

## 2020-07-07 DIAGNOSIS — R262 Difficulty in walking, not elsewhere classified: Secondary | ICD-10-CM | POA: Diagnosis not present

## 2020-07-07 DIAGNOSIS — R1312 Dysphagia, oropharyngeal phase: Secondary | ICD-10-CM | POA: Diagnosis not present

## 2020-07-07 DIAGNOSIS — R2681 Unsteadiness on feet: Secondary | ICD-10-CM | POA: Diagnosis not present

## 2020-07-07 DIAGNOSIS — J449 Chronic obstructive pulmonary disease, unspecified: Secondary | ICD-10-CM | POA: Diagnosis not present

## 2020-07-07 DIAGNOSIS — S7291XS Unspecified fracture of right femur, sequela: Secondary | ICD-10-CM | POA: Diagnosis not present

## 2020-07-07 DIAGNOSIS — Z9189 Other specified personal risk factors, not elsewhere classified: Secondary | ICD-10-CM | POA: Diagnosis not present

## 2020-07-07 DIAGNOSIS — M6281 Muscle weakness (generalized): Secondary | ICD-10-CM | POA: Diagnosis not present

## 2020-07-07 DIAGNOSIS — R278 Other lack of coordination: Secondary | ICD-10-CM | POA: Diagnosis not present

## 2020-07-08 DIAGNOSIS — R278 Other lack of coordination: Secondary | ICD-10-CM | POA: Diagnosis not present

## 2020-07-08 DIAGNOSIS — R41841 Cognitive communication deficit: Secondary | ICD-10-CM | POA: Diagnosis not present

## 2020-07-08 DIAGNOSIS — F0280 Dementia in other diseases classified elsewhere without behavioral disturbance: Secondary | ICD-10-CM | POA: Diagnosis not present

## 2020-07-08 DIAGNOSIS — M6281 Muscle weakness (generalized): Secondary | ICD-10-CM | POA: Diagnosis not present

## 2020-07-08 DIAGNOSIS — J449 Chronic obstructive pulmonary disease, unspecified: Secondary | ICD-10-CM | POA: Diagnosis not present

## 2020-07-08 DIAGNOSIS — R262 Difficulty in walking, not elsewhere classified: Secondary | ICD-10-CM | POA: Diagnosis not present

## 2020-07-08 DIAGNOSIS — R2681 Unsteadiness on feet: Secondary | ICD-10-CM | POA: Diagnosis not present

## 2020-07-08 DIAGNOSIS — S7291XS Unspecified fracture of right femur, sequela: Secondary | ICD-10-CM | POA: Diagnosis not present

## 2020-07-08 DIAGNOSIS — R1312 Dysphagia, oropharyngeal phase: Secondary | ICD-10-CM | POA: Diagnosis not present

## 2020-07-09 DIAGNOSIS — R2681 Unsteadiness on feet: Secondary | ICD-10-CM | POA: Diagnosis not present

## 2020-07-09 DIAGNOSIS — R41841 Cognitive communication deficit: Secondary | ICD-10-CM | POA: Diagnosis not present

## 2020-07-09 DIAGNOSIS — R262 Difficulty in walking, not elsewhere classified: Secondary | ICD-10-CM | POA: Diagnosis not present

## 2020-07-09 DIAGNOSIS — M6281 Muscle weakness (generalized): Secondary | ICD-10-CM | POA: Diagnosis not present

## 2020-07-09 DIAGNOSIS — F0280 Dementia in other diseases classified elsewhere without behavioral disturbance: Secondary | ICD-10-CM | POA: Diagnosis not present

## 2020-07-09 DIAGNOSIS — R278 Other lack of coordination: Secondary | ICD-10-CM | POA: Diagnosis not present

## 2020-07-09 DIAGNOSIS — J449 Chronic obstructive pulmonary disease, unspecified: Secondary | ICD-10-CM | POA: Diagnosis not present

## 2020-07-09 DIAGNOSIS — R1312 Dysphagia, oropharyngeal phase: Secondary | ICD-10-CM | POA: Diagnosis not present

## 2020-07-09 DIAGNOSIS — S7291XS Unspecified fracture of right femur, sequela: Secondary | ICD-10-CM | POA: Diagnosis not present

## 2020-07-10 DIAGNOSIS — F0280 Dementia in other diseases classified elsewhere without behavioral disturbance: Secondary | ICD-10-CM | POA: Diagnosis not present

## 2020-07-10 DIAGNOSIS — R262 Difficulty in walking, not elsewhere classified: Secondary | ICD-10-CM | POA: Diagnosis not present

## 2020-07-10 DIAGNOSIS — R41841 Cognitive communication deficit: Secondary | ICD-10-CM | POA: Diagnosis not present

## 2020-07-10 DIAGNOSIS — S7291XS Unspecified fracture of right femur, sequela: Secondary | ICD-10-CM | POA: Diagnosis not present

## 2020-07-10 DIAGNOSIS — R278 Other lack of coordination: Secondary | ICD-10-CM | POA: Diagnosis not present

## 2020-07-10 DIAGNOSIS — M6281 Muscle weakness (generalized): Secondary | ICD-10-CM | POA: Diagnosis not present

## 2020-07-10 DIAGNOSIS — R2681 Unsteadiness on feet: Secondary | ICD-10-CM | POA: Diagnosis not present

## 2020-07-10 DIAGNOSIS — J449 Chronic obstructive pulmonary disease, unspecified: Secondary | ICD-10-CM | POA: Diagnosis not present

## 2020-07-10 DIAGNOSIS — R1312 Dysphagia, oropharyngeal phase: Secondary | ICD-10-CM | POA: Diagnosis not present

## 2020-07-11 DIAGNOSIS — R41841 Cognitive communication deficit: Secondary | ICD-10-CM | POA: Diagnosis not present

## 2020-07-11 DIAGNOSIS — S7291XS Unspecified fracture of right femur, sequela: Secondary | ICD-10-CM | POA: Diagnosis not present

## 2020-07-11 DIAGNOSIS — J449 Chronic obstructive pulmonary disease, unspecified: Secondary | ICD-10-CM | POA: Diagnosis not present

## 2020-07-11 DIAGNOSIS — R2681 Unsteadiness on feet: Secondary | ICD-10-CM | POA: Diagnosis not present

## 2020-07-11 DIAGNOSIS — R1312 Dysphagia, oropharyngeal phase: Secondary | ICD-10-CM | POA: Diagnosis not present

## 2020-07-11 DIAGNOSIS — F0280 Dementia in other diseases classified elsewhere without behavioral disturbance: Secondary | ICD-10-CM | POA: Diagnosis not present

## 2020-07-11 DIAGNOSIS — M6281 Muscle weakness (generalized): Secondary | ICD-10-CM | POA: Diagnosis not present

## 2020-07-11 DIAGNOSIS — R262 Difficulty in walking, not elsewhere classified: Secondary | ICD-10-CM | POA: Diagnosis not present

## 2020-07-11 DIAGNOSIS — R278 Other lack of coordination: Secondary | ICD-10-CM | POA: Diagnosis not present

## 2020-07-12 DIAGNOSIS — R1312 Dysphagia, oropharyngeal phase: Secondary | ICD-10-CM | POA: Diagnosis not present

## 2020-07-12 DIAGNOSIS — R2681 Unsteadiness on feet: Secondary | ICD-10-CM | POA: Diagnosis not present

## 2020-07-12 DIAGNOSIS — S7291XS Unspecified fracture of right femur, sequela: Secondary | ICD-10-CM | POA: Diagnosis not present

## 2020-07-12 DIAGNOSIS — F0280 Dementia in other diseases classified elsewhere without behavioral disturbance: Secondary | ICD-10-CM | POA: Diagnosis not present

## 2020-07-12 DIAGNOSIS — R262 Difficulty in walking, not elsewhere classified: Secondary | ICD-10-CM | POA: Diagnosis not present

## 2020-07-12 DIAGNOSIS — R41841 Cognitive communication deficit: Secondary | ICD-10-CM | POA: Diagnosis not present

## 2020-07-12 DIAGNOSIS — M6281 Muscle weakness (generalized): Secondary | ICD-10-CM | POA: Diagnosis not present

## 2020-07-12 DIAGNOSIS — R278 Other lack of coordination: Secondary | ICD-10-CM | POA: Diagnosis not present

## 2020-07-12 DIAGNOSIS — J449 Chronic obstructive pulmonary disease, unspecified: Secondary | ICD-10-CM | POA: Diagnosis not present

## 2020-07-13 DIAGNOSIS — R1312 Dysphagia, oropharyngeal phase: Secondary | ICD-10-CM | POA: Diagnosis not present

## 2020-07-13 DIAGNOSIS — S7291XS Unspecified fracture of right femur, sequela: Secondary | ICD-10-CM | POA: Diagnosis not present

## 2020-07-13 DIAGNOSIS — R278 Other lack of coordination: Secondary | ICD-10-CM | POA: Diagnosis not present

## 2020-07-13 DIAGNOSIS — R2681 Unsteadiness on feet: Secondary | ICD-10-CM | POA: Diagnosis not present

## 2020-07-13 DIAGNOSIS — R41841 Cognitive communication deficit: Secondary | ICD-10-CM | POA: Diagnosis not present

## 2020-07-13 DIAGNOSIS — M6281 Muscle weakness (generalized): Secondary | ICD-10-CM | POA: Diagnosis not present

## 2020-07-13 DIAGNOSIS — F0280 Dementia in other diseases classified elsewhere without behavioral disturbance: Secondary | ICD-10-CM | POA: Diagnosis not present

## 2020-07-13 DIAGNOSIS — R262 Difficulty in walking, not elsewhere classified: Secondary | ICD-10-CM | POA: Diagnosis not present

## 2020-07-13 DIAGNOSIS — J449 Chronic obstructive pulmonary disease, unspecified: Secondary | ICD-10-CM | POA: Diagnosis not present

## 2020-07-14 DIAGNOSIS — R41841 Cognitive communication deficit: Secondary | ICD-10-CM | POA: Diagnosis not present

## 2020-07-14 DIAGNOSIS — R1312 Dysphagia, oropharyngeal phase: Secondary | ICD-10-CM | POA: Diagnosis not present

## 2020-07-14 DIAGNOSIS — F0280 Dementia in other diseases classified elsewhere without behavioral disturbance: Secondary | ICD-10-CM | POA: Diagnosis not present

## 2020-07-14 DIAGNOSIS — M6281 Muscle weakness (generalized): Secondary | ICD-10-CM | POA: Diagnosis not present

## 2020-07-14 DIAGNOSIS — S7291XS Unspecified fracture of right femur, sequela: Secondary | ICD-10-CM | POA: Diagnosis not present

## 2020-07-14 DIAGNOSIS — R262 Difficulty in walking, not elsewhere classified: Secondary | ICD-10-CM | POA: Diagnosis not present

## 2020-07-14 DIAGNOSIS — J449 Chronic obstructive pulmonary disease, unspecified: Secondary | ICD-10-CM | POA: Diagnosis not present

## 2020-07-14 DIAGNOSIS — R278 Other lack of coordination: Secondary | ICD-10-CM | POA: Diagnosis not present

## 2020-07-14 DIAGNOSIS — R2681 Unsteadiness on feet: Secondary | ICD-10-CM | POA: Diagnosis not present

## 2020-07-15 DIAGNOSIS — M6281 Muscle weakness (generalized): Secondary | ICD-10-CM | POA: Diagnosis not present

## 2020-07-15 DIAGNOSIS — R278 Other lack of coordination: Secondary | ICD-10-CM | POA: Diagnosis not present

## 2020-07-15 DIAGNOSIS — J449 Chronic obstructive pulmonary disease, unspecified: Secondary | ICD-10-CM | POA: Diagnosis not present

## 2020-07-15 DIAGNOSIS — R2681 Unsteadiness on feet: Secondary | ICD-10-CM | POA: Diagnosis not present

## 2020-07-15 DIAGNOSIS — F0280 Dementia in other diseases classified elsewhere without behavioral disturbance: Secondary | ICD-10-CM | POA: Diagnosis not present

## 2020-07-15 DIAGNOSIS — S7291XS Unspecified fracture of right femur, sequela: Secondary | ICD-10-CM | POA: Diagnosis not present

## 2020-07-15 DIAGNOSIS — R262 Difficulty in walking, not elsewhere classified: Secondary | ICD-10-CM | POA: Diagnosis not present

## 2020-07-15 DIAGNOSIS — R1312 Dysphagia, oropharyngeal phase: Secondary | ICD-10-CM | POA: Diagnosis not present

## 2020-07-15 DIAGNOSIS — R41841 Cognitive communication deficit: Secondary | ICD-10-CM | POA: Diagnosis not present

## 2020-07-16 DIAGNOSIS — R2681 Unsteadiness on feet: Secondary | ICD-10-CM | POA: Diagnosis not present

## 2020-07-16 DIAGNOSIS — R1312 Dysphagia, oropharyngeal phase: Secondary | ICD-10-CM | POA: Diagnosis not present

## 2020-07-16 DIAGNOSIS — R262 Difficulty in walking, not elsewhere classified: Secondary | ICD-10-CM | POA: Diagnosis not present

## 2020-07-16 DIAGNOSIS — S7291XS Unspecified fracture of right femur, sequela: Secondary | ICD-10-CM | POA: Diagnosis not present

## 2020-07-16 DIAGNOSIS — F0280 Dementia in other diseases classified elsewhere without behavioral disturbance: Secondary | ICD-10-CM | POA: Diagnosis not present

## 2020-07-16 DIAGNOSIS — R41841 Cognitive communication deficit: Secondary | ICD-10-CM | POA: Diagnosis not present

## 2020-07-16 DIAGNOSIS — R278 Other lack of coordination: Secondary | ICD-10-CM | POA: Diagnosis not present

## 2020-07-16 DIAGNOSIS — J449 Chronic obstructive pulmonary disease, unspecified: Secondary | ICD-10-CM | POA: Diagnosis not present

## 2020-07-16 DIAGNOSIS — M6281 Muscle weakness (generalized): Secondary | ICD-10-CM | POA: Diagnosis not present

## 2020-07-17 DIAGNOSIS — R1312 Dysphagia, oropharyngeal phase: Secondary | ICD-10-CM | POA: Diagnosis not present

## 2020-07-17 DIAGNOSIS — R278 Other lack of coordination: Secondary | ICD-10-CM | POA: Diagnosis not present

## 2020-07-17 DIAGNOSIS — J449 Chronic obstructive pulmonary disease, unspecified: Secondary | ICD-10-CM | POA: Diagnosis not present

## 2020-07-17 DIAGNOSIS — M6281 Muscle weakness (generalized): Secondary | ICD-10-CM | POA: Diagnosis not present

## 2020-07-17 DIAGNOSIS — R262 Difficulty in walking, not elsewhere classified: Secondary | ICD-10-CM | POA: Diagnosis not present

## 2020-07-17 DIAGNOSIS — S7291XS Unspecified fracture of right femur, sequela: Secondary | ICD-10-CM | POA: Diagnosis not present

## 2020-07-17 DIAGNOSIS — R2681 Unsteadiness on feet: Secondary | ICD-10-CM | POA: Diagnosis not present

## 2020-07-17 DIAGNOSIS — F0280 Dementia in other diseases classified elsewhere without behavioral disturbance: Secondary | ICD-10-CM | POA: Diagnosis not present

## 2020-07-17 DIAGNOSIS — R41841 Cognitive communication deficit: Secondary | ICD-10-CM | POA: Diagnosis not present

## 2020-07-18 DIAGNOSIS — S7291XS Unspecified fracture of right femur, sequela: Secondary | ICD-10-CM | POA: Diagnosis not present

## 2020-07-18 DIAGNOSIS — R41841 Cognitive communication deficit: Secondary | ICD-10-CM | POA: Diagnosis not present

## 2020-07-18 DIAGNOSIS — M6281 Muscle weakness (generalized): Secondary | ICD-10-CM | POA: Diagnosis not present

## 2020-07-18 DIAGNOSIS — R2681 Unsteadiness on feet: Secondary | ICD-10-CM | POA: Diagnosis not present

## 2020-07-18 DIAGNOSIS — J449 Chronic obstructive pulmonary disease, unspecified: Secondary | ICD-10-CM | POA: Diagnosis not present

## 2020-07-18 DIAGNOSIS — F0280 Dementia in other diseases classified elsewhere without behavioral disturbance: Secondary | ICD-10-CM | POA: Diagnosis not present

## 2020-07-18 DIAGNOSIS — R262 Difficulty in walking, not elsewhere classified: Secondary | ICD-10-CM | POA: Diagnosis not present

## 2020-07-18 DIAGNOSIS — R278 Other lack of coordination: Secondary | ICD-10-CM | POA: Diagnosis not present

## 2020-07-18 DIAGNOSIS — R1312 Dysphagia, oropharyngeal phase: Secondary | ICD-10-CM | POA: Diagnosis not present

## 2020-07-19 DIAGNOSIS — J449 Chronic obstructive pulmonary disease, unspecified: Secondary | ICD-10-CM | POA: Diagnosis not present

## 2020-07-19 DIAGNOSIS — R2681 Unsteadiness on feet: Secondary | ICD-10-CM | POA: Diagnosis not present

## 2020-07-19 DIAGNOSIS — S7291XS Unspecified fracture of right femur, sequela: Secondary | ICD-10-CM | POA: Diagnosis not present

## 2020-07-19 DIAGNOSIS — F0280 Dementia in other diseases classified elsewhere without behavioral disturbance: Secondary | ICD-10-CM | POA: Diagnosis not present

## 2020-07-19 DIAGNOSIS — M6281 Muscle weakness (generalized): Secondary | ICD-10-CM | POA: Diagnosis not present

## 2020-07-19 DIAGNOSIS — R1312 Dysphagia, oropharyngeal phase: Secondary | ICD-10-CM | POA: Diagnosis not present

## 2020-07-19 DIAGNOSIS — R41841 Cognitive communication deficit: Secondary | ICD-10-CM | POA: Diagnosis not present

## 2020-07-19 DIAGNOSIS — R262 Difficulty in walking, not elsewhere classified: Secondary | ICD-10-CM | POA: Diagnosis not present

## 2020-07-19 DIAGNOSIS — R278 Other lack of coordination: Secondary | ICD-10-CM | POA: Diagnosis not present

## 2020-07-20 DIAGNOSIS — J449 Chronic obstructive pulmonary disease, unspecified: Secondary | ICD-10-CM | POA: Diagnosis not present

## 2020-07-20 DIAGNOSIS — R1312 Dysphagia, oropharyngeal phase: Secondary | ICD-10-CM | POA: Diagnosis not present

## 2020-07-20 DIAGNOSIS — S7291XS Unspecified fracture of right femur, sequela: Secondary | ICD-10-CM | POA: Diagnosis not present

## 2020-07-20 DIAGNOSIS — F0280 Dementia in other diseases classified elsewhere without behavioral disturbance: Secondary | ICD-10-CM | POA: Diagnosis not present

## 2020-07-20 DIAGNOSIS — R41841 Cognitive communication deficit: Secondary | ICD-10-CM | POA: Diagnosis not present

## 2020-07-20 DIAGNOSIS — R278 Other lack of coordination: Secondary | ICD-10-CM | POA: Diagnosis not present

## 2020-07-20 DIAGNOSIS — R262 Difficulty in walking, not elsewhere classified: Secondary | ICD-10-CM | POA: Diagnosis not present

## 2020-07-20 DIAGNOSIS — M6281 Muscle weakness (generalized): Secondary | ICD-10-CM | POA: Diagnosis not present

## 2020-07-20 DIAGNOSIS — R2681 Unsteadiness on feet: Secondary | ICD-10-CM | POA: Diagnosis not present

## 2020-07-21 DIAGNOSIS — J449 Chronic obstructive pulmonary disease, unspecified: Secondary | ICD-10-CM | POA: Diagnosis not present

## 2020-07-21 DIAGNOSIS — R1312 Dysphagia, oropharyngeal phase: Secondary | ICD-10-CM | POA: Diagnosis not present

## 2020-07-21 DIAGNOSIS — R262 Difficulty in walking, not elsewhere classified: Secondary | ICD-10-CM | POA: Diagnosis not present

## 2020-07-21 DIAGNOSIS — R278 Other lack of coordination: Secondary | ICD-10-CM | POA: Diagnosis not present

## 2020-07-21 DIAGNOSIS — F0280 Dementia in other diseases classified elsewhere without behavioral disturbance: Secondary | ICD-10-CM | POA: Diagnosis not present

## 2020-07-21 DIAGNOSIS — R2681 Unsteadiness on feet: Secondary | ICD-10-CM | POA: Diagnosis not present

## 2020-07-21 DIAGNOSIS — R41841 Cognitive communication deficit: Secondary | ICD-10-CM | POA: Diagnosis not present

## 2020-07-21 DIAGNOSIS — M6281 Muscle weakness (generalized): Secondary | ICD-10-CM | POA: Diagnosis not present

## 2020-07-21 DIAGNOSIS — S7291XS Unspecified fracture of right femur, sequela: Secondary | ICD-10-CM | POA: Diagnosis not present

## 2020-07-22 DIAGNOSIS — M6281 Muscle weakness (generalized): Secondary | ICD-10-CM | POA: Diagnosis not present

## 2020-07-22 DIAGNOSIS — R1312 Dysphagia, oropharyngeal phase: Secondary | ICD-10-CM | POA: Diagnosis not present

## 2020-07-22 DIAGNOSIS — R278 Other lack of coordination: Secondary | ICD-10-CM | POA: Diagnosis not present

## 2020-07-22 DIAGNOSIS — S7291XS Unspecified fracture of right femur, sequela: Secondary | ICD-10-CM | POA: Diagnosis not present

## 2020-07-22 DIAGNOSIS — J449 Chronic obstructive pulmonary disease, unspecified: Secondary | ICD-10-CM | POA: Diagnosis not present

## 2020-07-22 DIAGNOSIS — R41841 Cognitive communication deficit: Secondary | ICD-10-CM | POA: Diagnosis not present

## 2020-07-22 DIAGNOSIS — R2681 Unsteadiness on feet: Secondary | ICD-10-CM | POA: Diagnosis not present

## 2020-07-22 DIAGNOSIS — R262 Difficulty in walking, not elsewhere classified: Secondary | ICD-10-CM | POA: Diagnosis not present

## 2020-07-22 DIAGNOSIS — F0280 Dementia in other diseases classified elsewhere without behavioral disturbance: Secondary | ICD-10-CM | POA: Diagnosis not present

## 2020-07-23 DIAGNOSIS — R262 Difficulty in walking, not elsewhere classified: Secondary | ICD-10-CM | POA: Diagnosis not present

## 2020-07-23 DIAGNOSIS — M6281 Muscle weakness (generalized): Secondary | ICD-10-CM | POA: Diagnosis not present

## 2020-07-23 DIAGNOSIS — F0280 Dementia in other diseases classified elsewhere without behavioral disturbance: Secondary | ICD-10-CM | POA: Diagnosis not present

## 2020-07-23 DIAGNOSIS — R278 Other lack of coordination: Secondary | ICD-10-CM | POA: Diagnosis not present

## 2020-07-23 DIAGNOSIS — J449 Chronic obstructive pulmonary disease, unspecified: Secondary | ICD-10-CM | POA: Diagnosis not present

## 2020-07-23 DIAGNOSIS — R2681 Unsteadiness on feet: Secondary | ICD-10-CM | POA: Diagnosis not present

## 2020-07-23 DIAGNOSIS — R1312 Dysphagia, oropharyngeal phase: Secondary | ICD-10-CM | POA: Diagnosis not present

## 2020-07-23 DIAGNOSIS — S7291XS Unspecified fracture of right femur, sequela: Secondary | ICD-10-CM | POA: Diagnosis not present

## 2020-07-23 DIAGNOSIS — R41841 Cognitive communication deficit: Secondary | ICD-10-CM | POA: Diagnosis not present

## 2020-07-30 DIAGNOSIS — J449 Chronic obstructive pulmonary disease, unspecified: Secondary | ICD-10-CM | POA: Diagnosis not present

## 2020-07-30 DIAGNOSIS — S7291XS Unspecified fracture of right femur, sequela: Secondary | ICD-10-CM | POA: Diagnosis not present

## 2020-07-30 DIAGNOSIS — F0280 Dementia in other diseases classified elsewhere without behavioral disturbance: Secondary | ICD-10-CM | POA: Diagnosis not present

## 2020-07-30 DIAGNOSIS — R278 Other lack of coordination: Secondary | ICD-10-CM | POA: Diagnosis not present

## 2020-07-30 DIAGNOSIS — R262 Difficulty in walking, not elsewhere classified: Secondary | ICD-10-CM | POA: Diagnosis not present

## 2020-07-30 DIAGNOSIS — R2681 Unsteadiness on feet: Secondary | ICD-10-CM | POA: Diagnosis not present

## 2020-07-30 DIAGNOSIS — R1312 Dysphagia, oropharyngeal phase: Secondary | ICD-10-CM | POA: Diagnosis not present

## 2020-07-30 DIAGNOSIS — R41841 Cognitive communication deficit: Secondary | ICD-10-CM | POA: Diagnosis not present

## 2020-07-30 DIAGNOSIS — M6281 Muscle weakness (generalized): Secondary | ICD-10-CM | POA: Diagnosis not present

## 2020-08-02 DIAGNOSIS — J449 Chronic obstructive pulmonary disease, unspecified: Secondary | ICD-10-CM | POA: Diagnosis not present

## 2020-08-02 DIAGNOSIS — R41841 Cognitive communication deficit: Secondary | ICD-10-CM | POA: Diagnosis not present

## 2020-08-02 DIAGNOSIS — F0280 Dementia in other diseases classified elsewhere without behavioral disturbance: Secondary | ICD-10-CM | POA: Diagnosis not present

## 2020-08-02 DIAGNOSIS — R2681 Unsteadiness on feet: Secondary | ICD-10-CM | POA: Diagnosis not present

## 2020-08-02 DIAGNOSIS — R278 Other lack of coordination: Secondary | ICD-10-CM | POA: Diagnosis not present

## 2020-08-02 DIAGNOSIS — R1312 Dysphagia, oropharyngeal phase: Secondary | ICD-10-CM | POA: Diagnosis not present

## 2020-08-02 DIAGNOSIS — S7291XS Unspecified fracture of right femur, sequela: Secondary | ICD-10-CM | POA: Diagnosis not present

## 2020-08-02 DIAGNOSIS — R262 Difficulty in walking, not elsewhere classified: Secondary | ICD-10-CM | POA: Diagnosis not present

## 2020-08-02 DIAGNOSIS — M6281 Muscle weakness (generalized): Secondary | ICD-10-CM | POA: Diagnosis not present

## 2020-08-03 DIAGNOSIS — R262 Difficulty in walking, not elsewhere classified: Secondary | ICD-10-CM | POA: Diagnosis not present

## 2020-08-03 DIAGNOSIS — R278 Other lack of coordination: Secondary | ICD-10-CM | POA: Diagnosis not present

## 2020-08-03 DIAGNOSIS — F0280 Dementia in other diseases classified elsewhere without behavioral disturbance: Secondary | ICD-10-CM | POA: Diagnosis not present

## 2020-08-03 DIAGNOSIS — R41841 Cognitive communication deficit: Secondary | ICD-10-CM | POA: Diagnosis not present

## 2020-08-03 DIAGNOSIS — R2681 Unsteadiness on feet: Secondary | ICD-10-CM | POA: Diagnosis not present

## 2020-08-03 DIAGNOSIS — M6281 Muscle weakness (generalized): Secondary | ICD-10-CM | POA: Diagnosis not present

## 2020-08-03 DIAGNOSIS — S7291XS Unspecified fracture of right femur, sequela: Secondary | ICD-10-CM | POA: Diagnosis not present

## 2020-08-03 DIAGNOSIS — R1312 Dysphagia, oropharyngeal phase: Secondary | ICD-10-CM | POA: Diagnosis not present

## 2020-08-03 DIAGNOSIS — J449 Chronic obstructive pulmonary disease, unspecified: Secondary | ICD-10-CM | POA: Diagnosis not present

## 2020-08-04 DIAGNOSIS — J449 Chronic obstructive pulmonary disease, unspecified: Secondary | ICD-10-CM | POA: Diagnosis not present

## 2020-08-04 DIAGNOSIS — S7291XS Unspecified fracture of right femur, sequela: Secondary | ICD-10-CM | POA: Diagnosis not present

## 2020-08-04 DIAGNOSIS — R262 Difficulty in walking, not elsewhere classified: Secondary | ICD-10-CM | POA: Diagnosis not present

## 2020-08-04 DIAGNOSIS — M6281 Muscle weakness (generalized): Secondary | ICD-10-CM | POA: Diagnosis not present

## 2020-08-04 DIAGNOSIS — R1312 Dysphagia, oropharyngeal phase: Secondary | ICD-10-CM | POA: Diagnosis not present

## 2020-08-04 DIAGNOSIS — F0280 Dementia in other diseases classified elsewhere without behavioral disturbance: Secondary | ICD-10-CM | POA: Diagnosis not present

## 2020-08-04 DIAGNOSIS — R278 Other lack of coordination: Secondary | ICD-10-CM | POA: Diagnosis not present

## 2020-08-04 DIAGNOSIS — R41841 Cognitive communication deficit: Secondary | ICD-10-CM | POA: Diagnosis not present

## 2020-08-04 DIAGNOSIS — R2681 Unsteadiness on feet: Secondary | ICD-10-CM | POA: Diagnosis not present

## 2020-08-05 DIAGNOSIS — R262 Difficulty in walking, not elsewhere classified: Secondary | ICD-10-CM | POA: Diagnosis not present

## 2020-08-05 DIAGNOSIS — J449 Chronic obstructive pulmonary disease, unspecified: Secondary | ICD-10-CM | POA: Diagnosis not present

## 2020-08-05 DIAGNOSIS — S7291XS Unspecified fracture of right femur, sequela: Secondary | ICD-10-CM | POA: Diagnosis not present

## 2020-08-05 DIAGNOSIS — R41841 Cognitive communication deficit: Secondary | ICD-10-CM | POA: Diagnosis not present

## 2020-08-05 DIAGNOSIS — F0280 Dementia in other diseases classified elsewhere without behavioral disturbance: Secondary | ICD-10-CM | POA: Diagnosis not present

## 2020-08-05 DIAGNOSIS — R278 Other lack of coordination: Secondary | ICD-10-CM | POA: Diagnosis not present

## 2020-08-05 DIAGNOSIS — R2681 Unsteadiness on feet: Secondary | ICD-10-CM | POA: Diagnosis not present

## 2020-08-05 DIAGNOSIS — M6281 Muscle weakness (generalized): Secondary | ICD-10-CM | POA: Diagnosis not present

## 2020-08-05 DIAGNOSIS — R1312 Dysphagia, oropharyngeal phase: Secondary | ICD-10-CM | POA: Diagnosis not present

## 2020-08-06 DIAGNOSIS — J449 Chronic obstructive pulmonary disease, unspecified: Secondary | ICD-10-CM | POA: Diagnosis not present

## 2020-08-06 DIAGNOSIS — R278 Other lack of coordination: Secondary | ICD-10-CM | POA: Diagnosis not present

## 2020-08-06 DIAGNOSIS — S7291XS Unspecified fracture of right femur, sequela: Secondary | ICD-10-CM | POA: Diagnosis not present

## 2020-08-06 DIAGNOSIS — F0280 Dementia in other diseases classified elsewhere without behavioral disturbance: Secondary | ICD-10-CM | POA: Diagnosis not present

## 2020-08-06 DIAGNOSIS — M6281 Muscle weakness (generalized): Secondary | ICD-10-CM | POA: Diagnosis not present

## 2020-08-06 DIAGNOSIS — R262 Difficulty in walking, not elsewhere classified: Secondary | ICD-10-CM | POA: Diagnosis not present

## 2020-08-06 DIAGNOSIS — R1312 Dysphagia, oropharyngeal phase: Secondary | ICD-10-CM | POA: Diagnosis not present

## 2020-08-06 DIAGNOSIS — R41841 Cognitive communication deficit: Secondary | ICD-10-CM | POA: Diagnosis not present

## 2020-08-06 DIAGNOSIS — R2681 Unsteadiness on feet: Secondary | ICD-10-CM | POA: Diagnosis not present

## 2020-08-10 DIAGNOSIS — R41841 Cognitive communication deficit: Secondary | ICD-10-CM | POA: Diagnosis not present

## 2020-08-10 DIAGNOSIS — M6281 Muscle weakness (generalized): Secondary | ICD-10-CM | POA: Diagnosis not present

## 2020-08-10 DIAGNOSIS — F0280 Dementia in other diseases classified elsewhere without behavioral disturbance: Secondary | ICD-10-CM | POA: Diagnosis not present

## 2020-08-10 DIAGNOSIS — R2681 Unsteadiness on feet: Secondary | ICD-10-CM | POA: Diagnosis not present

## 2020-08-10 DIAGNOSIS — R278 Other lack of coordination: Secondary | ICD-10-CM | POA: Diagnosis not present

## 2020-08-10 DIAGNOSIS — J449 Chronic obstructive pulmonary disease, unspecified: Secondary | ICD-10-CM | POA: Diagnosis not present

## 2020-08-10 DIAGNOSIS — R1312 Dysphagia, oropharyngeal phase: Secondary | ICD-10-CM | POA: Diagnosis not present

## 2020-08-10 DIAGNOSIS — R262 Difficulty in walking, not elsewhere classified: Secondary | ICD-10-CM | POA: Diagnosis not present

## 2020-08-10 DIAGNOSIS — S7291XS Unspecified fracture of right femur, sequela: Secondary | ICD-10-CM | POA: Diagnosis not present

## 2020-08-24 DIAGNOSIS — K5909 Other constipation: Secondary | ICD-10-CM | POA: Diagnosis not present

## 2020-08-24 DIAGNOSIS — R0989 Other specified symptoms and signs involving the circulatory and respiratory systems: Secondary | ICD-10-CM | POA: Diagnosis not present

## 2020-08-24 DIAGNOSIS — E638 Other specified nutritional deficiencies: Secondary | ICD-10-CM | POA: Diagnosis not present

## 2020-08-24 DIAGNOSIS — F0280 Dementia in other diseases classified elsewhere without behavioral disturbance: Secondary | ICD-10-CM | POA: Diagnosis not present

## 2020-08-24 DIAGNOSIS — E7849 Other hyperlipidemia: Secondary | ICD-10-CM | POA: Diagnosis not present

## 2020-08-24 DIAGNOSIS — J984 Other disorders of lung: Secondary | ICD-10-CM | POA: Diagnosis not present

## 2020-08-26 DIAGNOSIS — D519 Vitamin B12 deficiency anemia, unspecified: Secondary | ICD-10-CM | POA: Diagnosis not present

## 2020-08-26 DIAGNOSIS — R946 Abnormal results of thyroid function studies: Secondary | ICD-10-CM | POA: Diagnosis not present

## 2020-08-26 DIAGNOSIS — Z13228 Encounter for screening for other metabolic disorders: Secondary | ICD-10-CM | POA: Diagnosis not present

## 2020-08-26 DIAGNOSIS — R799 Abnormal finding of blood chemistry, unspecified: Secondary | ICD-10-CM | POA: Diagnosis not present

## 2020-08-26 DIAGNOSIS — E559 Vitamin D deficiency, unspecified: Secondary | ICD-10-CM | POA: Diagnosis not present

## 2020-08-27 DIAGNOSIS — E038 Other specified hypothyroidism: Secondary | ICD-10-CM | POA: Diagnosis not present

## 2020-08-27 DIAGNOSIS — J984 Other disorders of lung: Secondary | ICD-10-CM | POA: Diagnosis not present

## 2020-08-27 DIAGNOSIS — E7849 Other hyperlipidemia: Secondary | ICD-10-CM | POA: Diagnosis not present

## 2020-08-28 DIAGNOSIS — N189 Chronic kidney disease, unspecified: Secondary | ICD-10-CM | POA: Diagnosis not present

## 2020-09-03 DIAGNOSIS — M79675 Pain in left toe(s): Secondary | ICD-10-CM | POA: Diagnosis not present

## 2020-09-03 DIAGNOSIS — B351 Tinea unguium: Secondary | ICD-10-CM | POA: Diagnosis not present

## 2020-09-03 DIAGNOSIS — M79674 Pain in right toe(s): Secondary | ICD-10-CM | POA: Diagnosis not present

## 2020-09-03 DIAGNOSIS — I739 Peripheral vascular disease, unspecified: Secondary | ICD-10-CM | POA: Diagnosis not present

## 2020-09-03 DIAGNOSIS — R262 Difficulty in walking, not elsewhere classified: Secondary | ICD-10-CM | POA: Diagnosis not present

## 2020-09-20 DIAGNOSIS — F0391 Unspecified dementia with behavioral disturbance: Secondary | ICD-10-CM | POA: Diagnosis not present

## 2020-09-20 DIAGNOSIS — F418 Other specified anxiety disorders: Secondary | ICD-10-CM | POA: Diagnosis not present

## 2020-10-04 DIAGNOSIS — K5901 Slow transit constipation: Secondary | ICD-10-CM | POA: Diagnosis not present

## 2020-10-20 DIAGNOSIS — F0280 Dementia in other diseases classified elsewhere without behavioral disturbance: Secondary | ICD-10-CM | POA: Diagnosis not present

## 2020-10-20 DIAGNOSIS — R1312 Dysphagia, oropharyngeal phase: Secondary | ICD-10-CM | POA: Diagnosis not present

## 2020-10-20 DIAGNOSIS — S7291XS Unspecified fracture of right femur, sequela: Secondary | ICD-10-CM | POA: Diagnosis not present

## 2020-10-20 DIAGNOSIS — J449 Chronic obstructive pulmonary disease, unspecified: Secondary | ICD-10-CM | POA: Diagnosis not present

## 2020-10-20 DIAGNOSIS — R2681 Unsteadiness on feet: Secondary | ICD-10-CM | POA: Diagnosis not present

## 2020-10-20 DIAGNOSIS — M6281 Muscle weakness (generalized): Secondary | ICD-10-CM | POA: Diagnosis not present

## 2020-10-20 DIAGNOSIS — R41841 Cognitive communication deficit: Secondary | ICD-10-CM | POA: Diagnosis not present

## 2020-10-20 DIAGNOSIS — R278 Other lack of coordination: Secondary | ICD-10-CM | POA: Diagnosis not present

## 2020-10-20 DIAGNOSIS — R262 Difficulty in walking, not elsewhere classified: Secondary | ICD-10-CM | POA: Diagnosis not present

## 2020-10-21 DIAGNOSIS — J449 Chronic obstructive pulmonary disease, unspecified: Secondary | ICD-10-CM | POA: Diagnosis not present

## 2020-10-21 DIAGNOSIS — R2681 Unsteadiness on feet: Secondary | ICD-10-CM | POA: Diagnosis not present

## 2020-10-21 DIAGNOSIS — R262 Difficulty in walking, not elsewhere classified: Secondary | ICD-10-CM | POA: Diagnosis not present

## 2020-10-21 DIAGNOSIS — M6281 Muscle weakness (generalized): Secondary | ICD-10-CM | POA: Diagnosis not present

## 2020-10-21 DIAGNOSIS — R41841 Cognitive communication deficit: Secondary | ICD-10-CM | POA: Diagnosis not present

## 2020-10-21 DIAGNOSIS — R278 Other lack of coordination: Secondary | ICD-10-CM | POA: Diagnosis not present

## 2020-10-21 DIAGNOSIS — R1312 Dysphagia, oropharyngeal phase: Secondary | ICD-10-CM | POA: Diagnosis not present

## 2020-10-21 DIAGNOSIS — F0280 Dementia in other diseases classified elsewhere without behavioral disturbance: Secondary | ICD-10-CM | POA: Diagnosis not present

## 2020-10-21 DIAGNOSIS — S7291XS Unspecified fracture of right femur, sequela: Secondary | ICD-10-CM | POA: Diagnosis not present

## 2020-10-22 DIAGNOSIS — F0390 Unspecified dementia without behavioral disturbance: Secondary | ICD-10-CM | POA: Diagnosis not present

## 2020-10-22 DIAGNOSIS — R278 Other lack of coordination: Secondary | ICD-10-CM | POA: Diagnosis not present

## 2020-10-22 DIAGNOSIS — R262 Difficulty in walking, not elsewhere classified: Secondary | ICD-10-CM | POA: Diagnosis not present

## 2020-10-22 DIAGNOSIS — R41841 Cognitive communication deficit: Secondary | ICD-10-CM | POA: Diagnosis not present

## 2020-10-22 DIAGNOSIS — M6281 Muscle weakness (generalized): Secondary | ICD-10-CM | POA: Diagnosis not present

## 2020-10-22 DIAGNOSIS — R2681 Unsteadiness on feet: Secondary | ICD-10-CM | POA: Diagnosis not present

## 2020-10-22 DIAGNOSIS — R1312 Dysphagia, oropharyngeal phase: Secondary | ICD-10-CM | POA: Diagnosis not present

## 2020-10-22 DIAGNOSIS — K5901 Slow transit constipation: Secondary | ICD-10-CM | POA: Diagnosis not present

## 2020-10-22 DIAGNOSIS — S7291XS Unspecified fracture of right femur, sequela: Secondary | ICD-10-CM | POA: Diagnosis not present

## 2020-10-22 DIAGNOSIS — J449 Chronic obstructive pulmonary disease, unspecified: Secondary | ICD-10-CM | POA: Diagnosis not present

## 2020-10-22 DIAGNOSIS — F0280 Dementia in other diseases classified elsewhere without behavioral disturbance: Secondary | ICD-10-CM | POA: Diagnosis not present

## 2020-10-25 DIAGNOSIS — F0280 Dementia in other diseases classified elsewhere without behavioral disturbance: Secondary | ICD-10-CM | POA: Diagnosis not present

## 2020-10-25 DIAGNOSIS — R2681 Unsteadiness on feet: Secondary | ICD-10-CM | POA: Diagnosis not present

## 2020-10-25 DIAGNOSIS — M6281 Muscle weakness (generalized): Secondary | ICD-10-CM | POA: Diagnosis not present

## 2020-10-25 DIAGNOSIS — S7291XS Unspecified fracture of right femur, sequela: Secondary | ICD-10-CM | POA: Diagnosis not present

## 2020-10-25 DIAGNOSIS — R41841 Cognitive communication deficit: Secondary | ICD-10-CM | POA: Diagnosis not present

## 2020-10-25 DIAGNOSIS — R262 Difficulty in walking, not elsewhere classified: Secondary | ICD-10-CM | POA: Diagnosis not present

## 2020-10-25 DIAGNOSIS — J449 Chronic obstructive pulmonary disease, unspecified: Secondary | ICD-10-CM | POA: Diagnosis not present

## 2020-10-25 DIAGNOSIS — R278 Other lack of coordination: Secondary | ICD-10-CM | POA: Diagnosis not present

## 2020-10-25 DIAGNOSIS — R1312 Dysphagia, oropharyngeal phase: Secondary | ICD-10-CM | POA: Diagnosis not present

## 2020-10-26 DIAGNOSIS — M6281 Muscle weakness (generalized): Secondary | ICD-10-CM | POA: Diagnosis not present

## 2020-10-26 DIAGNOSIS — J449 Chronic obstructive pulmonary disease, unspecified: Secondary | ICD-10-CM | POA: Diagnosis not present

## 2020-10-26 DIAGNOSIS — R1312 Dysphagia, oropharyngeal phase: Secondary | ICD-10-CM | POA: Diagnosis not present

## 2020-10-26 DIAGNOSIS — R262 Difficulty in walking, not elsewhere classified: Secondary | ICD-10-CM | POA: Diagnosis not present

## 2020-10-26 DIAGNOSIS — S7291XS Unspecified fracture of right femur, sequela: Secondary | ICD-10-CM | POA: Diagnosis not present

## 2020-10-26 DIAGNOSIS — F0280 Dementia in other diseases classified elsewhere without behavioral disturbance: Secondary | ICD-10-CM | POA: Diagnosis not present

## 2020-10-26 DIAGNOSIS — R2681 Unsteadiness on feet: Secondary | ICD-10-CM | POA: Diagnosis not present

## 2020-10-26 DIAGNOSIS — R41841 Cognitive communication deficit: Secondary | ICD-10-CM | POA: Diagnosis not present

## 2020-10-26 DIAGNOSIS — R278 Other lack of coordination: Secondary | ICD-10-CM | POA: Diagnosis not present

## 2020-10-27 DIAGNOSIS — J449 Chronic obstructive pulmonary disease, unspecified: Secondary | ICD-10-CM | POA: Diagnosis not present

## 2020-10-27 DIAGNOSIS — S7291XS Unspecified fracture of right femur, sequela: Secondary | ICD-10-CM | POA: Diagnosis not present

## 2020-10-27 DIAGNOSIS — M6281 Muscle weakness (generalized): Secondary | ICD-10-CM | POA: Diagnosis not present

## 2020-10-27 DIAGNOSIS — R1312 Dysphagia, oropharyngeal phase: Secondary | ICD-10-CM | POA: Diagnosis not present

## 2020-10-27 DIAGNOSIS — R41841 Cognitive communication deficit: Secondary | ICD-10-CM | POA: Diagnosis not present

## 2020-10-27 DIAGNOSIS — R262 Difficulty in walking, not elsewhere classified: Secondary | ICD-10-CM | POA: Diagnosis not present

## 2020-10-27 DIAGNOSIS — R278 Other lack of coordination: Secondary | ICD-10-CM | POA: Diagnosis not present

## 2020-10-27 DIAGNOSIS — F0280 Dementia in other diseases classified elsewhere without behavioral disturbance: Secondary | ICD-10-CM | POA: Diagnosis not present

## 2020-10-27 DIAGNOSIS — R2681 Unsteadiness on feet: Secondary | ICD-10-CM | POA: Diagnosis not present

## 2020-10-28 DIAGNOSIS — R278 Other lack of coordination: Secondary | ICD-10-CM | POA: Diagnosis not present

## 2020-10-28 DIAGNOSIS — R262 Difficulty in walking, not elsewhere classified: Secondary | ICD-10-CM | POA: Diagnosis not present

## 2020-10-28 DIAGNOSIS — R1312 Dysphagia, oropharyngeal phase: Secondary | ICD-10-CM | POA: Diagnosis not present

## 2020-10-28 DIAGNOSIS — J449 Chronic obstructive pulmonary disease, unspecified: Secondary | ICD-10-CM | POA: Diagnosis not present

## 2020-10-28 DIAGNOSIS — S7291XS Unspecified fracture of right femur, sequela: Secondary | ICD-10-CM | POA: Diagnosis not present

## 2020-10-28 DIAGNOSIS — R41841 Cognitive communication deficit: Secondary | ICD-10-CM | POA: Diagnosis not present

## 2020-10-28 DIAGNOSIS — F0280 Dementia in other diseases classified elsewhere without behavioral disturbance: Secondary | ICD-10-CM | POA: Diagnosis not present

## 2020-10-28 DIAGNOSIS — R2681 Unsteadiness on feet: Secondary | ICD-10-CM | POA: Diagnosis not present

## 2020-10-28 DIAGNOSIS — M6281 Muscle weakness (generalized): Secondary | ICD-10-CM | POA: Diagnosis not present

## 2020-10-29 DIAGNOSIS — M6281 Muscle weakness (generalized): Secondary | ICD-10-CM | POA: Diagnosis not present

## 2020-10-29 DIAGNOSIS — R262 Difficulty in walking, not elsewhere classified: Secondary | ICD-10-CM | POA: Diagnosis not present

## 2020-10-29 DIAGNOSIS — S7291XS Unspecified fracture of right femur, sequela: Secondary | ICD-10-CM | POA: Diagnosis not present

## 2020-10-29 DIAGNOSIS — R2681 Unsteadiness on feet: Secondary | ICD-10-CM | POA: Diagnosis not present

## 2020-10-29 DIAGNOSIS — R278 Other lack of coordination: Secondary | ICD-10-CM | POA: Diagnosis not present

## 2020-10-29 DIAGNOSIS — R41841 Cognitive communication deficit: Secondary | ICD-10-CM | POA: Diagnosis not present

## 2020-10-29 DIAGNOSIS — R1312 Dysphagia, oropharyngeal phase: Secondary | ICD-10-CM | POA: Diagnosis not present

## 2020-10-29 DIAGNOSIS — F0280 Dementia in other diseases classified elsewhere without behavioral disturbance: Secondary | ICD-10-CM | POA: Diagnosis not present

## 2020-10-29 DIAGNOSIS — J449 Chronic obstructive pulmonary disease, unspecified: Secondary | ICD-10-CM | POA: Diagnosis not present

## 2020-11-02 DIAGNOSIS — R278 Other lack of coordination: Secondary | ICD-10-CM | POA: Diagnosis not present

## 2020-11-02 DIAGNOSIS — R1312 Dysphagia, oropharyngeal phase: Secondary | ICD-10-CM | POA: Diagnosis not present

## 2020-11-02 DIAGNOSIS — R41841 Cognitive communication deficit: Secondary | ICD-10-CM | POA: Diagnosis not present

## 2020-11-02 DIAGNOSIS — F0280 Dementia in other diseases classified elsewhere without behavioral disturbance: Secondary | ICD-10-CM | POA: Diagnosis not present

## 2020-11-02 DIAGNOSIS — J449 Chronic obstructive pulmonary disease, unspecified: Secondary | ICD-10-CM | POA: Diagnosis not present

## 2020-11-02 DIAGNOSIS — R262 Difficulty in walking, not elsewhere classified: Secondary | ICD-10-CM | POA: Diagnosis not present

## 2020-11-02 DIAGNOSIS — M6281 Muscle weakness (generalized): Secondary | ICD-10-CM | POA: Diagnosis not present

## 2020-11-02 DIAGNOSIS — R2681 Unsteadiness on feet: Secondary | ICD-10-CM | POA: Diagnosis not present

## 2020-11-02 DIAGNOSIS — S7291XS Unspecified fracture of right femur, sequela: Secondary | ICD-10-CM | POA: Diagnosis not present

## 2020-11-03 DIAGNOSIS — F0280 Dementia in other diseases classified elsewhere without behavioral disturbance: Secondary | ICD-10-CM | POA: Diagnosis not present

## 2020-11-03 DIAGNOSIS — R2681 Unsteadiness on feet: Secondary | ICD-10-CM | POA: Diagnosis not present

## 2020-11-03 DIAGNOSIS — R41841 Cognitive communication deficit: Secondary | ICD-10-CM | POA: Diagnosis not present

## 2020-11-03 DIAGNOSIS — R278 Other lack of coordination: Secondary | ICD-10-CM | POA: Diagnosis not present

## 2020-11-03 DIAGNOSIS — S7291XS Unspecified fracture of right femur, sequela: Secondary | ICD-10-CM | POA: Diagnosis not present

## 2020-11-03 DIAGNOSIS — R262 Difficulty in walking, not elsewhere classified: Secondary | ICD-10-CM | POA: Diagnosis not present

## 2020-11-03 DIAGNOSIS — J449 Chronic obstructive pulmonary disease, unspecified: Secondary | ICD-10-CM | POA: Diagnosis not present

## 2020-11-03 DIAGNOSIS — M6281 Muscle weakness (generalized): Secondary | ICD-10-CM | POA: Diagnosis not present

## 2020-11-03 DIAGNOSIS — R1312 Dysphagia, oropharyngeal phase: Secondary | ICD-10-CM | POA: Diagnosis not present

## 2020-11-04 DIAGNOSIS — S7291XS Unspecified fracture of right femur, sequela: Secondary | ICD-10-CM | POA: Diagnosis not present

## 2020-11-04 DIAGNOSIS — J449 Chronic obstructive pulmonary disease, unspecified: Secondary | ICD-10-CM | POA: Diagnosis not present

## 2020-11-04 DIAGNOSIS — M6281 Muscle weakness (generalized): Secondary | ICD-10-CM | POA: Diagnosis not present

## 2020-11-04 DIAGNOSIS — R1312 Dysphagia, oropharyngeal phase: Secondary | ICD-10-CM | POA: Diagnosis not present

## 2020-11-04 DIAGNOSIS — F0280 Dementia in other diseases classified elsewhere without behavioral disturbance: Secondary | ICD-10-CM | POA: Diagnosis not present

## 2020-11-04 DIAGNOSIS — R2681 Unsteadiness on feet: Secondary | ICD-10-CM | POA: Diagnosis not present

## 2020-11-04 DIAGNOSIS — R262 Difficulty in walking, not elsewhere classified: Secondary | ICD-10-CM | POA: Diagnosis not present

## 2020-11-04 DIAGNOSIS — R278 Other lack of coordination: Secondary | ICD-10-CM | POA: Diagnosis not present

## 2020-11-04 DIAGNOSIS — R41841 Cognitive communication deficit: Secondary | ICD-10-CM | POA: Diagnosis not present

## 2020-11-05 DIAGNOSIS — R2681 Unsteadiness on feet: Secondary | ICD-10-CM | POA: Diagnosis not present

## 2020-11-05 DIAGNOSIS — R1312 Dysphagia, oropharyngeal phase: Secondary | ICD-10-CM | POA: Diagnosis not present

## 2020-11-05 DIAGNOSIS — R278 Other lack of coordination: Secondary | ICD-10-CM | POA: Diagnosis not present

## 2020-11-05 DIAGNOSIS — S7291XS Unspecified fracture of right femur, sequela: Secondary | ICD-10-CM | POA: Diagnosis not present

## 2020-11-05 DIAGNOSIS — F0280 Dementia in other diseases classified elsewhere without behavioral disturbance: Secondary | ICD-10-CM | POA: Diagnosis not present

## 2020-11-05 DIAGNOSIS — R41841 Cognitive communication deficit: Secondary | ICD-10-CM | POA: Diagnosis not present

## 2020-11-05 DIAGNOSIS — M6281 Muscle weakness (generalized): Secondary | ICD-10-CM | POA: Diagnosis not present

## 2020-11-05 DIAGNOSIS — J449 Chronic obstructive pulmonary disease, unspecified: Secondary | ICD-10-CM | POA: Diagnosis not present

## 2020-11-05 DIAGNOSIS — R262 Difficulty in walking, not elsewhere classified: Secondary | ICD-10-CM | POA: Diagnosis not present

## 2020-11-08 DIAGNOSIS — R41841 Cognitive communication deficit: Secondary | ICD-10-CM | POA: Diagnosis not present

## 2020-11-08 DIAGNOSIS — R262 Difficulty in walking, not elsewhere classified: Secondary | ICD-10-CM | POA: Diagnosis not present

## 2020-11-08 DIAGNOSIS — R2681 Unsteadiness on feet: Secondary | ICD-10-CM | POA: Diagnosis not present

## 2020-11-08 DIAGNOSIS — F0280 Dementia in other diseases classified elsewhere without behavioral disturbance: Secondary | ICD-10-CM | POA: Diagnosis not present

## 2020-11-08 DIAGNOSIS — R278 Other lack of coordination: Secondary | ICD-10-CM | POA: Diagnosis not present

## 2020-11-08 DIAGNOSIS — R1312 Dysphagia, oropharyngeal phase: Secondary | ICD-10-CM | POA: Diagnosis not present

## 2020-11-08 DIAGNOSIS — S7291XS Unspecified fracture of right femur, sequela: Secondary | ICD-10-CM | POA: Diagnosis not present

## 2020-11-08 DIAGNOSIS — M6281 Muscle weakness (generalized): Secondary | ICD-10-CM | POA: Diagnosis not present

## 2020-11-08 DIAGNOSIS — J449 Chronic obstructive pulmonary disease, unspecified: Secondary | ICD-10-CM | POA: Diagnosis not present

## 2020-11-09 DIAGNOSIS — R41841 Cognitive communication deficit: Secondary | ICD-10-CM | POA: Diagnosis not present

## 2020-11-09 DIAGNOSIS — M6281 Muscle weakness (generalized): Secondary | ICD-10-CM | POA: Diagnosis not present

## 2020-11-09 DIAGNOSIS — R278 Other lack of coordination: Secondary | ICD-10-CM | POA: Diagnosis not present

## 2020-11-09 DIAGNOSIS — R2681 Unsteadiness on feet: Secondary | ICD-10-CM | POA: Diagnosis not present

## 2020-11-09 DIAGNOSIS — S7291XS Unspecified fracture of right femur, sequela: Secondary | ICD-10-CM | POA: Diagnosis not present

## 2020-11-09 DIAGNOSIS — F0280 Dementia in other diseases classified elsewhere without behavioral disturbance: Secondary | ICD-10-CM | POA: Diagnosis not present

## 2020-11-09 DIAGNOSIS — R1312 Dysphagia, oropharyngeal phase: Secondary | ICD-10-CM | POA: Diagnosis not present

## 2020-11-09 DIAGNOSIS — J449 Chronic obstructive pulmonary disease, unspecified: Secondary | ICD-10-CM | POA: Diagnosis not present

## 2020-11-09 DIAGNOSIS — R262 Difficulty in walking, not elsewhere classified: Secondary | ICD-10-CM | POA: Diagnosis not present

## 2020-11-10 DIAGNOSIS — R1312 Dysphagia, oropharyngeal phase: Secondary | ICD-10-CM | POA: Diagnosis not present

## 2020-11-10 DIAGNOSIS — R278 Other lack of coordination: Secondary | ICD-10-CM | POA: Diagnosis not present

## 2020-11-10 DIAGNOSIS — M6281 Muscle weakness (generalized): Secondary | ICD-10-CM | POA: Diagnosis not present

## 2020-11-10 DIAGNOSIS — J449 Chronic obstructive pulmonary disease, unspecified: Secondary | ICD-10-CM | POA: Diagnosis not present

## 2020-11-10 DIAGNOSIS — F0280 Dementia in other diseases classified elsewhere without behavioral disturbance: Secondary | ICD-10-CM | POA: Diagnosis not present

## 2020-11-10 DIAGNOSIS — R41841 Cognitive communication deficit: Secondary | ICD-10-CM | POA: Diagnosis not present

## 2020-11-10 DIAGNOSIS — S7291XS Unspecified fracture of right femur, sequela: Secondary | ICD-10-CM | POA: Diagnosis not present

## 2020-11-10 DIAGNOSIS — R262 Difficulty in walking, not elsewhere classified: Secondary | ICD-10-CM | POA: Diagnosis not present

## 2020-11-10 DIAGNOSIS — R2681 Unsteadiness on feet: Secondary | ICD-10-CM | POA: Diagnosis not present

## 2020-11-22 DIAGNOSIS — F418 Other specified anxiety disorders: Secondary | ICD-10-CM | POA: Diagnosis not present

## 2020-11-22 DIAGNOSIS — F0391 Unspecified dementia with behavioral disturbance: Secondary | ICD-10-CM | POA: Diagnosis not present

## 2020-11-26 DIAGNOSIS — R278 Other lack of coordination: Secondary | ICD-10-CM | POA: Diagnosis not present

## 2020-11-26 DIAGNOSIS — S7291XS Unspecified fracture of right femur, sequela: Secondary | ICD-10-CM | POA: Diagnosis not present

## 2020-11-26 DIAGNOSIS — R41841 Cognitive communication deficit: Secondary | ICD-10-CM | POA: Diagnosis not present

## 2020-11-26 DIAGNOSIS — F0280 Dementia in other diseases classified elsewhere without behavioral disturbance: Secondary | ICD-10-CM | POA: Diagnosis not present

## 2020-11-26 DIAGNOSIS — J449 Chronic obstructive pulmonary disease, unspecified: Secondary | ICD-10-CM | POA: Diagnosis not present

## 2020-11-26 DIAGNOSIS — R2681 Unsteadiness on feet: Secondary | ICD-10-CM | POA: Diagnosis not present

## 2020-11-26 DIAGNOSIS — R262 Difficulty in walking, not elsewhere classified: Secondary | ICD-10-CM | POA: Diagnosis not present

## 2020-11-26 DIAGNOSIS — R1312 Dysphagia, oropharyngeal phase: Secondary | ICD-10-CM | POA: Diagnosis not present

## 2020-11-26 DIAGNOSIS — M6281 Muscle weakness (generalized): Secondary | ICD-10-CM | POA: Diagnosis not present

## 2020-11-27 DIAGNOSIS — E0590 Thyrotoxicosis, unspecified without thyrotoxic crisis or storm: Secondary | ICD-10-CM | POA: Diagnosis not present

## 2020-11-27 DIAGNOSIS — N189 Chronic kidney disease, unspecified: Secondary | ICD-10-CM | POA: Diagnosis not present

## 2020-11-29 DIAGNOSIS — R1312 Dysphagia, oropharyngeal phase: Secondary | ICD-10-CM | POA: Diagnosis not present

## 2020-11-29 DIAGNOSIS — R278 Other lack of coordination: Secondary | ICD-10-CM | POA: Diagnosis not present

## 2020-11-29 DIAGNOSIS — R262 Difficulty in walking, not elsewhere classified: Secondary | ICD-10-CM | POA: Diagnosis not present

## 2020-11-29 DIAGNOSIS — F0280 Dementia in other diseases classified elsewhere without behavioral disturbance: Secondary | ICD-10-CM | POA: Diagnosis not present

## 2020-11-29 DIAGNOSIS — S7291XS Unspecified fracture of right femur, sequela: Secondary | ICD-10-CM | POA: Diagnosis not present

## 2020-11-29 DIAGNOSIS — J449 Chronic obstructive pulmonary disease, unspecified: Secondary | ICD-10-CM | POA: Diagnosis not present

## 2020-11-29 DIAGNOSIS — R2681 Unsteadiness on feet: Secondary | ICD-10-CM | POA: Diagnosis not present

## 2020-11-29 DIAGNOSIS — M6281 Muscle weakness (generalized): Secondary | ICD-10-CM | POA: Diagnosis not present

## 2020-11-29 DIAGNOSIS — R41841 Cognitive communication deficit: Secondary | ICD-10-CM | POA: Diagnosis not present

## 2020-12-01 DIAGNOSIS — R262 Difficulty in walking, not elsewhere classified: Secondary | ICD-10-CM | POA: Diagnosis not present

## 2020-12-01 DIAGNOSIS — R2681 Unsteadiness on feet: Secondary | ICD-10-CM | POA: Diagnosis not present

## 2020-12-01 DIAGNOSIS — R41841 Cognitive communication deficit: Secondary | ICD-10-CM | POA: Diagnosis not present

## 2020-12-01 DIAGNOSIS — J449 Chronic obstructive pulmonary disease, unspecified: Secondary | ICD-10-CM | POA: Diagnosis not present

## 2020-12-01 DIAGNOSIS — M6281 Muscle weakness (generalized): Secondary | ICD-10-CM | POA: Diagnosis not present

## 2020-12-01 DIAGNOSIS — F0280 Dementia in other diseases classified elsewhere without behavioral disturbance: Secondary | ICD-10-CM | POA: Diagnosis not present

## 2020-12-01 DIAGNOSIS — S7291XS Unspecified fracture of right femur, sequela: Secondary | ICD-10-CM | POA: Diagnosis not present

## 2020-12-01 DIAGNOSIS — R278 Other lack of coordination: Secondary | ICD-10-CM | POA: Diagnosis not present

## 2020-12-01 DIAGNOSIS — R1312 Dysphagia, oropharyngeal phase: Secondary | ICD-10-CM | POA: Diagnosis not present

## 2020-12-07 DIAGNOSIS — J449 Chronic obstructive pulmonary disease, unspecified: Secondary | ICD-10-CM | POA: Diagnosis not present

## 2020-12-07 DIAGNOSIS — F0280 Dementia in other diseases classified elsewhere without behavioral disturbance: Secondary | ICD-10-CM | POA: Diagnosis not present

## 2020-12-07 DIAGNOSIS — R2681 Unsteadiness on feet: Secondary | ICD-10-CM | POA: Diagnosis not present

## 2020-12-07 DIAGNOSIS — R41841 Cognitive communication deficit: Secondary | ICD-10-CM | POA: Diagnosis not present

## 2020-12-07 DIAGNOSIS — R1312 Dysphagia, oropharyngeal phase: Secondary | ICD-10-CM | POA: Diagnosis not present

## 2020-12-07 DIAGNOSIS — R262 Difficulty in walking, not elsewhere classified: Secondary | ICD-10-CM | POA: Diagnosis not present

## 2020-12-07 DIAGNOSIS — S7291XS Unspecified fracture of right femur, sequela: Secondary | ICD-10-CM | POA: Diagnosis not present

## 2020-12-07 DIAGNOSIS — M6281 Muscle weakness (generalized): Secondary | ICD-10-CM | POA: Diagnosis not present

## 2020-12-07 DIAGNOSIS — R278 Other lack of coordination: Secondary | ICD-10-CM | POA: Diagnosis not present

## 2020-12-08 DIAGNOSIS — M6281 Muscle weakness (generalized): Secondary | ICD-10-CM | POA: Diagnosis not present

## 2020-12-08 DIAGNOSIS — R278 Other lack of coordination: Secondary | ICD-10-CM | POA: Diagnosis not present

## 2020-12-08 DIAGNOSIS — R41841 Cognitive communication deficit: Secondary | ICD-10-CM | POA: Diagnosis not present

## 2020-12-08 DIAGNOSIS — J449 Chronic obstructive pulmonary disease, unspecified: Secondary | ICD-10-CM | POA: Diagnosis not present

## 2020-12-08 DIAGNOSIS — R2681 Unsteadiness on feet: Secondary | ICD-10-CM | POA: Diagnosis not present

## 2020-12-08 DIAGNOSIS — R1312 Dysphagia, oropharyngeal phase: Secondary | ICD-10-CM | POA: Diagnosis not present

## 2020-12-08 DIAGNOSIS — F0280 Dementia in other diseases classified elsewhere without behavioral disturbance: Secondary | ICD-10-CM | POA: Diagnosis not present

## 2020-12-08 DIAGNOSIS — R262 Difficulty in walking, not elsewhere classified: Secondary | ICD-10-CM | POA: Diagnosis not present

## 2020-12-08 DIAGNOSIS — S7291XS Unspecified fracture of right femur, sequela: Secondary | ICD-10-CM | POA: Diagnosis not present

## 2020-12-09 DIAGNOSIS — R2681 Unsteadiness on feet: Secondary | ICD-10-CM | POA: Diagnosis not present

## 2020-12-09 DIAGNOSIS — F0280 Dementia in other diseases classified elsewhere without behavioral disturbance: Secondary | ICD-10-CM | POA: Diagnosis not present

## 2020-12-09 DIAGNOSIS — R278 Other lack of coordination: Secondary | ICD-10-CM | POA: Diagnosis not present

## 2020-12-09 DIAGNOSIS — J449 Chronic obstructive pulmonary disease, unspecified: Secondary | ICD-10-CM | POA: Diagnosis not present

## 2020-12-09 DIAGNOSIS — M6281 Muscle weakness (generalized): Secondary | ICD-10-CM | POA: Diagnosis not present

## 2020-12-09 DIAGNOSIS — R41841 Cognitive communication deficit: Secondary | ICD-10-CM | POA: Diagnosis not present

## 2020-12-09 DIAGNOSIS — R262 Difficulty in walking, not elsewhere classified: Secondary | ICD-10-CM | POA: Diagnosis not present

## 2020-12-09 DIAGNOSIS — S7291XS Unspecified fracture of right femur, sequela: Secondary | ICD-10-CM | POA: Diagnosis not present

## 2020-12-09 DIAGNOSIS — R1312 Dysphagia, oropharyngeal phase: Secondary | ICD-10-CM | POA: Diagnosis not present

## 2020-12-13 DIAGNOSIS — R2681 Unsteadiness on feet: Secondary | ICD-10-CM | POA: Diagnosis not present

## 2020-12-13 DIAGNOSIS — R278 Other lack of coordination: Secondary | ICD-10-CM | POA: Diagnosis not present

## 2020-12-13 DIAGNOSIS — F0280 Dementia in other diseases classified elsewhere without behavioral disturbance: Secondary | ICD-10-CM | POA: Diagnosis not present

## 2020-12-13 DIAGNOSIS — R262 Difficulty in walking, not elsewhere classified: Secondary | ICD-10-CM | POA: Diagnosis not present

## 2020-12-13 DIAGNOSIS — S7291XS Unspecified fracture of right femur, sequela: Secondary | ICD-10-CM | POA: Diagnosis not present

## 2020-12-13 DIAGNOSIS — R41841 Cognitive communication deficit: Secondary | ICD-10-CM | POA: Diagnosis not present

## 2020-12-13 DIAGNOSIS — M6281 Muscle weakness (generalized): Secondary | ICD-10-CM | POA: Diagnosis not present

## 2020-12-13 DIAGNOSIS — R1312 Dysphagia, oropharyngeal phase: Secondary | ICD-10-CM | POA: Diagnosis not present

## 2020-12-13 DIAGNOSIS — J449 Chronic obstructive pulmonary disease, unspecified: Secondary | ICD-10-CM | POA: Diagnosis not present

## 2020-12-15 DIAGNOSIS — R278 Other lack of coordination: Secondary | ICD-10-CM | POA: Diagnosis not present

## 2020-12-15 DIAGNOSIS — F0280 Dementia in other diseases classified elsewhere without behavioral disturbance: Secondary | ICD-10-CM | POA: Diagnosis not present

## 2020-12-15 DIAGNOSIS — R262 Difficulty in walking, not elsewhere classified: Secondary | ICD-10-CM | POA: Diagnosis not present

## 2020-12-15 DIAGNOSIS — R1312 Dysphagia, oropharyngeal phase: Secondary | ICD-10-CM | POA: Diagnosis not present

## 2020-12-15 DIAGNOSIS — J449 Chronic obstructive pulmonary disease, unspecified: Secondary | ICD-10-CM | POA: Diagnosis not present

## 2020-12-15 DIAGNOSIS — S7291XS Unspecified fracture of right femur, sequela: Secondary | ICD-10-CM | POA: Diagnosis not present

## 2020-12-15 DIAGNOSIS — M6281 Muscle weakness (generalized): Secondary | ICD-10-CM | POA: Diagnosis not present

## 2020-12-15 DIAGNOSIS — R2681 Unsteadiness on feet: Secondary | ICD-10-CM | POA: Diagnosis not present

## 2020-12-15 DIAGNOSIS — R41841 Cognitive communication deficit: Secondary | ICD-10-CM | POA: Diagnosis not present

## 2020-12-16 DIAGNOSIS — R278 Other lack of coordination: Secondary | ICD-10-CM | POA: Diagnosis not present

## 2020-12-16 DIAGNOSIS — M6281 Muscle weakness (generalized): Secondary | ICD-10-CM | POA: Diagnosis not present

## 2020-12-16 DIAGNOSIS — J449 Chronic obstructive pulmonary disease, unspecified: Secondary | ICD-10-CM | POA: Diagnosis not present

## 2020-12-16 DIAGNOSIS — R41841 Cognitive communication deficit: Secondary | ICD-10-CM | POA: Diagnosis not present

## 2020-12-16 DIAGNOSIS — S7291XS Unspecified fracture of right femur, sequela: Secondary | ICD-10-CM | POA: Diagnosis not present

## 2020-12-16 DIAGNOSIS — F0280 Dementia in other diseases classified elsewhere without behavioral disturbance: Secondary | ICD-10-CM | POA: Diagnosis not present

## 2020-12-16 DIAGNOSIS — R1312 Dysphagia, oropharyngeal phase: Secondary | ICD-10-CM | POA: Diagnosis not present

## 2020-12-16 DIAGNOSIS — R2681 Unsteadiness on feet: Secondary | ICD-10-CM | POA: Diagnosis not present

## 2020-12-16 DIAGNOSIS — R262 Difficulty in walking, not elsewhere classified: Secondary | ICD-10-CM | POA: Diagnosis not present

## 2020-12-20 DIAGNOSIS — S7291XS Unspecified fracture of right femur, sequela: Secondary | ICD-10-CM | POA: Diagnosis not present

## 2020-12-20 DIAGNOSIS — F0280 Dementia in other diseases classified elsewhere without behavioral disturbance: Secondary | ICD-10-CM | POA: Diagnosis not present

## 2020-12-20 DIAGNOSIS — R262 Difficulty in walking, not elsewhere classified: Secondary | ICD-10-CM | POA: Diagnosis not present

## 2020-12-20 DIAGNOSIS — J449 Chronic obstructive pulmonary disease, unspecified: Secondary | ICD-10-CM | POA: Diagnosis not present

## 2020-12-20 DIAGNOSIS — R278 Other lack of coordination: Secondary | ICD-10-CM | POA: Diagnosis not present

## 2020-12-20 DIAGNOSIS — R1312 Dysphagia, oropharyngeal phase: Secondary | ICD-10-CM | POA: Diagnosis not present

## 2020-12-20 DIAGNOSIS — R41841 Cognitive communication deficit: Secondary | ICD-10-CM | POA: Diagnosis not present

## 2020-12-20 DIAGNOSIS — M6281 Muscle weakness (generalized): Secondary | ICD-10-CM | POA: Diagnosis not present

## 2020-12-20 DIAGNOSIS — R2681 Unsteadiness on feet: Secondary | ICD-10-CM | POA: Diagnosis not present

## 2020-12-28 DIAGNOSIS — E038 Other specified hypothyroidism: Secondary | ICD-10-CM | POA: Diagnosis not present

## 2020-12-28 DIAGNOSIS — K5909 Other constipation: Secondary | ICD-10-CM | POA: Diagnosis not present

## 2020-12-28 DIAGNOSIS — E7849 Other hyperlipidemia: Secondary | ICD-10-CM | POA: Diagnosis not present

## 2020-12-28 DIAGNOSIS — E441 Mild protein-calorie malnutrition: Secondary | ICD-10-CM | POA: Diagnosis not present

## 2020-12-28 DIAGNOSIS — J984 Other disorders of lung: Secondary | ICD-10-CM | POA: Diagnosis not present

## 2021-01-03 DIAGNOSIS — I739 Peripheral vascular disease, unspecified: Secondary | ICD-10-CM | POA: Diagnosis not present

## 2021-01-03 DIAGNOSIS — B351 Tinea unguium: Secondary | ICD-10-CM | POA: Diagnosis not present

## 2021-01-03 DIAGNOSIS — L603 Nail dystrophy: Secondary | ICD-10-CM | POA: Diagnosis not present

## 2021-01-24 DIAGNOSIS — J449 Chronic obstructive pulmonary disease, unspecified: Secondary | ICD-10-CM | POA: Diagnosis not present

## 2021-01-24 DIAGNOSIS — R2689 Other abnormalities of gait and mobility: Secondary | ICD-10-CM | POA: Diagnosis not present

## 2021-01-24 DIAGNOSIS — F0280 Dementia in other diseases classified elsewhere without behavioral disturbance: Secondary | ICD-10-CM | POA: Diagnosis not present

## 2021-01-24 DIAGNOSIS — S7291XS Unspecified fracture of right femur, sequela: Secondary | ICD-10-CM | POA: Diagnosis not present

## 2021-01-24 DIAGNOSIS — M6281 Muscle weakness (generalized): Secondary | ICD-10-CM | POA: Diagnosis not present

## 2021-01-24 DIAGNOSIS — R278 Other lack of coordination: Secondary | ICD-10-CM | POA: Diagnosis not present

## 2021-01-24 DIAGNOSIS — R2681 Unsteadiness on feet: Secondary | ICD-10-CM | POA: Diagnosis not present

## 2021-01-24 DIAGNOSIS — M6249 Contracture of muscle, multiple sites: Secondary | ICD-10-CM | POA: Diagnosis not present

## 2021-01-24 DIAGNOSIS — R1312 Dysphagia, oropharyngeal phase: Secondary | ICD-10-CM | POA: Diagnosis not present

## 2021-01-27 DIAGNOSIS — M6281 Muscle weakness (generalized): Secondary | ICD-10-CM | POA: Diagnosis not present

## 2021-01-27 DIAGNOSIS — R2681 Unsteadiness on feet: Secondary | ICD-10-CM | POA: Diagnosis not present

## 2021-01-27 DIAGNOSIS — R278 Other lack of coordination: Secondary | ICD-10-CM | POA: Diagnosis not present

## 2021-01-27 DIAGNOSIS — J449 Chronic obstructive pulmonary disease, unspecified: Secondary | ICD-10-CM | POA: Diagnosis not present

## 2021-01-27 DIAGNOSIS — S7291XS Unspecified fracture of right femur, sequela: Secondary | ICD-10-CM | POA: Diagnosis not present

## 2021-01-27 DIAGNOSIS — R1312 Dysphagia, oropharyngeal phase: Secondary | ICD-10-CM | POA: Diagnosis not present

## 2021-01-27 DIAGNOSIS — M6249 Contracture of muscle, multiple sites: Secondary | ICD-10-CM | POA: Diagnosis not present

## 2021-01-27 DIAGNOSIS — R2689 Other abnormalities of gait and mobility: Secondary | ICD-10-CM | POA: Diagnosis not present

## 2021-01-27 DIAGNOSIS — F0280 Dementia in other diseases classified elsewhere without behavioral disturbance: Secondary | ICD-10-CM | POA: Diagnosis not present

## 2021-01-28 DIAGNOSIS — R2689 Other abnormalities of gait and mobility: Secondary | ICD-10-CM | POA: Diagnosis not present

## 2021-01-28 DIAGNOSIS — M6281 Muscle weakness (generalized): Secondary | ICD-10-CM | POA: Diagnosis not present

## 2021-01-28 DIAGNOSIS — R2681 Unsteadiness on feet: Secondary | ICD-10-CM | POA: Diagnosis not present

## 2021-01-28 DIAGNOSIS — R1312 Dysphagia, oropharyngeal phase: Secondary | ICD-10-CM | POA: Diagnosis not present

## 2021-01-28 DIAGNOSIS — S7291XS Unspecified fracture of right femur, sequela: Secondary | ICD-10-CM | POA: Diagnosis not present

## 2021-01-28 DIAGNOSIS — M6249 Contracture of muscle, multiple sites: Secondary | ICD-10-CM | POA: Diagnosis not present

## 2021-01-28 DIAGNOSIS — F0280 Dementia in other diseases classified elsewhere without behavioral disturbance: Secondary | ICD-10-CM | POA: Diagnosis not present

## 2021-01-28 DIAGNOSIS — J449 Chronic obstructive pulmonary disease, unspecified: Secondary | ICD-10-CM | POA: Diagnosis not present

## 2021-01-28 DIAGNOSIS — R278 Other lack of coordination: Secondary | ICD-10-CM | POA: Diagnosis not present

## 2021-01-29 DIAGNOSIS — J449 Chronic obstructive pulmonary disease, unspecified: Secondary | ICD-10-CM | POA: Diagnosis not present

## 2021-01-29 DIAGNOSIS — R2689 Other abnormalities of gait and mobility: Secondary | ICD-10-CM | POA: Diagnosis not present

## 2021-01-29 DIAGNOSIS — R1312 Dysphagia, oropharyngeal phase: Secondary | ICD-10-CM | POA: Diagnosis not present

## 2021-01-29 DIAGNOSIS — F0280 Dementia in other diseases classified elsewhere without behavioral disturbance: Secondary | ICD-10-CM | POA: Diagnosis not present

## 2021-01-29 DIAGNOSIS — R278 Other lack of coordination: Secondary | ICD-10-CM | POA: Diagnosis not present

## 2021-01-29 DIAGNOSIS — M6249 Contracture of muscle, multiple sites: Secondary | ICD-10-CM | POA: Diagnosis not present

## 2021-01-29 DIAGNOSIS — R2681 Unsteadiness on feet: Secondary | ICD-10-CM | POA: Diagnosis not present

## 2021-01-29 DIAGNOSIS — S7291XS Unspecified fracture of right femur, sequela: Secondary | ICD-10-CM | POA: Diagnosis not present

## 2021-01-29 DIAGNOSIS — M6281 Muscle weakness (generalized): Secondary | ICD-10-CM | POA: Diagnosis not present

## 2021-01-31 DIAGNOSIS — R2681 Unsteadiness on feet: Secondary | ICD-10-CM | POA: Diagnosis not present

## 2021-01-31 DIAGNOSIS — R1312 Dysphagia, oropharyngeal phase: Secondary | ICD-10-CM | POA: Diagnosis not present

## 2021-01-31 DIAGNOSIS — S7291XS Unspecified fracture of right femur, sequela: Secondary | ICD-10-CM | POA: Diagnosis not present

## 2021-01-31 DIAGNOSIS — R2689 Other abnormalities of gait and mobility: Secondary | ICD-10-CM | POA: Diagnosis not present

## 2021-01-31 DIAGNOSIS — J449 Chronic obstructive pulmonary disease, unspecified: Secondary | ICD-10-CM | POA: Diagnosis not present

## 2021-01-31 DIAGNOSIS — M6281 Muscle weakness (generalized): Secondary | ICD-10-CM | POA: Diagnosis not present

## 2021-01-31 DIAGNOSIS — F0280 Dementia in other diseases classified elsewhere without behavioral disturbance: Secondary | ICD-10-CM | POA: Diagnosis not present

## 2021-01-31 DIAGNOSIS — R278 Other lack of coordination: Secondary | ICD-10-CM | POA: Diagnosis not present

## 2021-01-31 DIAGNOSIS — M6249 Contracture of muscle, multiple sites: Secondary | ICD-10-CM | POA: Diagnosis not present

## 2021-02-01 DIAGNOSIS — M6249 Contracture of muscle, multiple sites: Secondary | ICD-10-CM | POA: Diagnosis not present

## 2021-02-01 DIAGNOSIS — R2681 Unsteadiness on feet: Secondary | ICD-10-CM | POA: Diagnosis not present

## 2021-02-01 DIAGNOSIS — J449 Chronic obstructive pulmonary disease, unspecified: Secondary | ICD-10-CM | POA: Diagnosis not present

## 2021-02-01 DIAGNOSIS — R278 Other lack of coordination: Secondary | ICD-10-CM | POA: Diagnosis not present

## 2021-02-01 DIAGNOSIS — R2689 Other abnormalities of gait and mobility: Secondary | ICD-10-CM | POA: Diagnosis not present

## 2021-02-01 DIAGNOSIS — S7291XS Unspecified fracture of right femur, sequela: Secondary | ICD-10-CM | POA: Diagnosis not present

## 2021-02-01 DIAGNOSIS — F0280 Dementia in other diseases classified elsewhere without behavioral disturbance: Secondary | ICD-10-CM | POA: Diagnosis not present

## 2021-02-01 DIAGNOSIS — M6281 Muscle weakness (generalized): Secondary | ICD-10-CM | POA: Diagnosis not present

## 2021-02-01 DIAGNOSIS — R1312 Dysphagia, oropharyngeal phase: Secondary | ICD-10-CM | POA: Diagnosis not present

## 2021-02-02 DIAGNOSIS — J449 Chronic obstructive pulmonary disease, unspecified: Secondary | ICD-10-CM | POA: Diagnosis not present

## 2021-02-02 DIAGNOSIS — F0280 Dementia in other diseases classified elsewhere without behavioral disturbance: Secondary | ICD-10-CM | POA: Diagnosis not present

## 2021-02-02 DIAGNOSIS — M6281 Muscle weakness (generalized): Secondary | ICD-10-CM | POA: Diagnosis not present

## 2021-02-02 DIAGNOSIS — R278 Other lack of coordination: Secondary | ICD-10-CM | POA: Diagnosis not present

## 2021-02-02 DIAGNOSIS — M6249 Contracture of muscle, multiple sites: Secondary | ICD-10-CM | POA: Diagnosis not present

## 2021-02-02 DIAGNOSIS — R2681 Unsteadiness on feet: Secondary | ICD-10-CM | POA: Diagnosis not present

## 2021-02-02 DIAGNOSIS — R2689 Other abnormalities of gait and mobility: Secondary | ICD-10-CM | POA: Diagnosis not present

## 2021-02-02 DIAGNOSIS — S7291XS Unspecified fracture of right femur, sequela: Secondary | ICD-10-CM | POA: Diagnosis not present

## 2021-02-02 DIAGNOSIS — R1312 Dysphagia, oropharyngeal phase: Secondary | ICD-10-CM | POA: Diagnosis not present

## 2021-02-03 DIAGNOSIS — F0280 Dementia in other diseases classified elsewhere without behavioral disturbance: Secondary | ICD-10-CM | POA: Diagnosis not present

## 2021-02-03 DIAGNOSIS — M6281 Muscle weakness (generalized): Secondary | ICD-10-CM | POA: Diagnosis not present

## 2021-02-03 DIAGNOSIS — R1312 Dysphagia, oropharyngeal phase: Secondary | ICD-10-CM | POA: Diagnosis not present

## 2021-02-03 DIAGNOSIS — R2689 Other abnormalities of gait and mobility: Secondary | ICD-10-CM | POA: Diagnosis not present

## 2021-02-03 DIAGNOSIS — M6249 Contracture of muscle, multiple sites: Secondary | ICD-10-CM | POA: Diagnosis not present

## 2021-02-03 DIAGNOSIS — J449 Chronic obstructive pulmonary disease, unspecified: Secondary | ICD-10-CM | POA: Diagnosis not present

## 2021-02-03 DIAGNOSIS — R2681 Unsteadiness on feet: Secondary | ICD-10-CM | POA: Diagnosis not present

## 2021-02-03 DIAGNOSIS — S7291XS Unspecified fracture of right femur, sequela: Secondary | ICD-10-CM | POA: Diagnosis not present

## 2021-02-03 DIAGNOSIS — R278 Other lack of coordination: Secondary | ICD-10-CM | POA: Diagnosis not present

## 2021-02-04 DIAGNOSIS — R1312 Dysphagia, oropharyngeal phase: Secondary | ICD-10-CM | POA: Diagnosis not present

## 2021-02-04 DIAGNOSIS — M6249 Contracture of muscle, multiple sites: Secondary | ICD-10-CM | POA: Diagnosis not present

## 2021-02-04 DIAGNOSIS — R2681 Unsteadiness on feet: Secondary | ICD-10-CM | POA: Diagnosis not present

## 2021-02-04 DIAGNOSIS — J449 Chronic obstructive pulmonary disease, unspecified: Secondary | ICD-10-CM | POA: Diagnosis not present

## 2021-02-04 DIAGNOSIS — R278 Other lack of coordination: Secondary | ICD-10-CM | POA: Diagnosis not present

## 2021-02-04 DIAGNOSIS — S7291XS Unspecified fracture of right femur, sequela: Secondary | ICD-10-CM | POA: Diagnosis not present

## 2021-02-04 DIAGNOSIS — R2689 Other abnormalities of gait and mobility: Secondary | ICD-10-CM | POA: Diagnosis not present

## 2021-02-04 DIAGNOSIS — M6281 Muscle weakness (generalized): Secondary | ICD-10-CM | POA: Diagnosis not present

## 2021-02-04 DIAGNOSIS — F0280 Dementia in other diseases classified elsewhere without behavioral disturbance: Secondary | ICD-10-CM | POA: Diagnosis not present

## 2021-02-05 DIAGNOSIS — R2689 Other abnormalities of gait and mobility: Secondary | ICD-10-CM | POA: Diagnosis not present

## 2021-02-05 DIAGNOSIS — M6249 Contracture of muscle, multiple sites: Secondary | ICD-10-CM | POA: Diagnosis not present

## 2021-02-05 DIAGNOSIS — F0280 Dementia in other diseases classified elsewhere without behavioral disturbance: Secondary | ICD-10-CM | POA: Diagnosis not present

## 2021-02-05 DIAGNOSIS — J449 Chronic obstructive pulmonary disease, unspecified: Secondary | ICD-10-CM | POA: Diagnosis not present

## 2021-02-05 DIAGNOSIS — M6281 Muscle weakness (generalized): Secondary | ICD-10-CM | POA: Diagnosis not present

## 2021-02-05 DIAGNOSIS — S7291XS Unspecified fracture of right femur, sequela: Secondary | ICD-10-CM | POA: Diagnosis not present

## 2021-02-05 DIAGNOSIS — R2681 Unsteadiness on feet: Secondary | ICD-10-CM | POA: Diagnosis not present

## 2021-02-05 DIAGNOSIS — R278 Other lack of coordination: Secondary | ICD-10-CM | POA: Diagnosis not present

## 2021-02-05 DIAGNOSIS — R1312 Dysphagia, oropharyngeal phase: Secondary | ICD-10-CM | POA: Diagnosis not present

## 2021-02-07 DIAGNOSIS — S7291XS Unspecified fracture of right femur, sequela: Secondary | ICD-10-CM | POA: Diagnosis not present

## 2021-02-07 DIAGNOSIS — R2681 Unsteadiness on feet: Secondary | ICD-10-CM | POA: Diagnosis not present

## 2021-02-07 DIAGNOSIS — J449 Chronic obstructive pulmonary disease, unspecified: Secondary | ICD-10-CM | POA: Diagnosis not present

## 2021-02-07 DIAGNOSIS — M6281 Muscle weakness (generalized): Secondary | ICD-10-CM | POA: Diagnosis not present

## 2021-02-07 DIAGNOSIS — R1312 Dysphagia, oropharyngeal phase: Secondary | ICD-10-CM | POA: Diagnosis not present

## 2021-02-07 DIAGNOSIS — R278 Other lack of coordination: Secondary | ICD-10-CM | POA: Diagnosis not present

## 2021-02-07 DIAGNOSIS — R2689 Other abnormalities of gait and mobility: Secondary | ICD-10-CM | POA: Diagnosis not present

## 2021-02-07 DIAGNOSIS — M6249 Contracture of muscle, multiple sites: Secondary | ICD-10-CM | POA: Diagnosis not present

## 2021-02-07 DIAGNOSIS — F0280 Dementia in other diseases classified elsewhere without behavioral disturbance: Secondary | ICD-10-CM | POA: Diagnosis not present

## 2021-02-08 DIAGNOSIS — M6249 Contracture of muscle, multiple sites: Secondary | ICD-10-CM | POA: Diagnosis not present

## 2021-02-08 DIAGNOSIS — R278 Other lack of coordination: Secondary | ICD-10-CM | POA: Diagnosis not present

## 2021-02-08 DIAGNOSIS — R2681 Unsteadiness on feet: Secondary | ICD-10-CM | POA: Diagnosis not present

## 2021-02-08 DIAGNOSIS — F0280 Dementia in other diseases classified elsewhere without behavioral disturbance: Secondary | ICD-10-CM | POA: Diagnosis not present

## 2021-02-08 DIAGNOSIS — R1312 Dysphagia, oropharyngeal phase: Secondary | ICD-10-CM | POA: Diagnosis not present

## 2021-02-08 DIAGNOSIS — R2689 Other abnormalities of gait and mobility: Secondary | ICD-10-CM | POA: Diagnosis not present

## 2021-02-08 DIAGNOSIS — S7291XS Unspecified fracture of right femur, sequela: Secondary | ICD-10-CM | POA: Diagnosis not present

## 2021-02-08 DIAGNOSIS — J449 Chronic obstructive pulmonary disease, unspecified: Secondary | ICD-10-CM | POA: Diagnosis not present

## 2021-02-08 DIAGNOSIS — M6281 Muscle weakness (generalized): Secondary | ICD-10-CM | POA: Diagnosis not present

## 2021-02-09 DIAGNOSIS — R2689 Other abnormalities of gait and mobility: Secondary | ICD-10-CM | POA: Diagnosis not present

## 2021-02-09 DIAGNOSIS — R1312 Dysphagia, oropharyngeal phase: Secondary | ICD-10-CM | POA: Diagnosis not present

## 2021-02-09 DIAGNOSIS — R278 Other lack of coordination: Secondary | ICD-10-CM | POA: Diagnosis not present

## 2021-02-09 DIAGNOSIS — R2681 Unsteadiness on feet: Secondary | ICD-10-CM | POA: Diagnosis not present

## 2021-02-09 DIAGNOSIS — M6281 Muscle weakness (generalized): Secondary | ICD-10-CM | POA: Diagnosis not present

## 2021-02-09 DIAGNOSIS — M6249 Contracture of muscle, multiple sites: Secondary | ICD-10-CM | POA: Diagnosis not present

## 2021-02-09 DIAGNOSIS — S7291XS Unspecified fracture of right femur, sequela: Secondary | ICD-10-CM | POA: Diagnosis not present

## 2021-02-09 DIAGNOSIS — F0280 Dementia in other diseases classified elsewhere without behavioral disturbance: Secondary | ICD-10-CM | POA: Diagnosis not present

## 2021-02-09 DIAGNOSIS — J449 Chronic obstructive pulmonary disease, unspecified: Secondary | ICD-10-CM | POA: Diagnosis not present

## 2021-02-10 DIAGNOSIS — R2689 Other abnormalities of gait and mobility: Secondary | ICD-10-CM | POA: Diagnosis not present

## 2021-02-10 DIAGNOSIS — M6281 Muscle weakness (generalized): Secondary | ICD-10-CM | POA: Diagnosis not present

## 2021-02-10 DIAGNOSIS — S7291XS Unspecified fracture of right femur, sequela: Secondary | ICD-10-CM | POA: Diagnosis not present

## 2021-02-10 DIAGNOSIS — M6249 Contracture of muscle, multiple sites: Secondary | ICD-10-CM | POA: Diagnosis not present

## 2021-02-10 DIAGNOSIS — J449 Chronic obstructive pulmonary disease, unspecified: Secondary | ICD-10-CM | POA: Diagnosis not present

## 2021-02-10 DIAGNOSIS — R278 Other lack of coordination: Secondary | ICD-10-CM | POA: Diagnosis not present

## 2021-02-10 DIAGNOSIS — R2681 Unsteadiness on feet: Secondary | ICD-10-CM | POA: Diagnosis not present

## 2021-02-10 DIAGNOSIS — R1312 Dysphagia, oropharyngeal phase: Secondary | ICD-10-CM | POA: Diagnosis not present

## 2021-02-10 DIAGNOSIS — F0280 Dementia in other diseases classified elsewhere without behavioral disturbance: Secondary | ICD-10-CM | POA: Diagnosis not present

## 2021-02-11 DIAGNOSIS — R2681 Unsteadiness on feet: Secondary | ICD-10-CM | POA: Diagnosis not present

## 2021-02-11 DIAGNOSIS — M6281 Muscle weakness (generalized): Secondary | ICD-10-CM | POA: Diagnosis not present

## 2021-02-11 DIAGNOSIS — F0280 Dementia in other diseases classified elsewhere without behavioral disturbance: Secondary | ICD-10-CM | POA: Diagnosis not present

## 2021-02-11 DIAGNOSIS — R1312 Dysphagia, oropharyngeal phase: Secondary | ICD-10-CM | POA: Diagnosis not present

## 2021-02-11 DIAGNOSIS — J449 Chronic obstructive pulmonary disease, unspecified: Secondary | ICD-10-CM | POA: Diagnosis not present

## 2021-02-11 DIAGNOSIS — R2689 Other abnormalities of gait and mobility: Secondary | ICD-10-CM | POA: Diagnosis not present

## 2021-02-11 DIAGNOSIS — R278 Other lack of coordination: Secondary | ICD-10-CM | POA: Diagnosis not present

## 2021-02-11 DIAGNOSIS — M6249 Contracture of muscle, multiple sites: Secondary | ICD-10-CM | POA: Diagnosis not present

## 2021-02-11 DIAGNOSIS — S7291XS Unspecified fracture of right femur, sequela: Secondary | ICD-10-CM | POA: Diagnosis not present

## 2021-02-14 DIAGNOSIS — F0391 Unspecified dementia with behavioral disturbance: Secondary | ICD-10-CM | POA: Diagnosis not present

## 2021-02-14 DIAGNOSIS — R278 Other lack of coordination: Secondary | ICD-10-CM | POA: Diagnosis not present

## 2021-02-14 DIAGNOSIS — R1312 Dysphagia, oropharyngeal phase: Secondary | ICD-10-CM | POA: Diagnosis not present

## 2021-02-14 DIAGNOSIS — F0280 Dementia in other diseases classified elsewhere without behavioral disturbance: Secondary | ICD-10-CM | POA: Diagnosis not present

## 2021-02-14 DIAGNOSIS — S7291XS Unspecified fracture of right femur, sequela: Secondary | ICD-10-CM | POA: Diagnosis not present

## 2021-02-14 DIAGNOSIS — J449 Chronic obstructive pulmonary disease, unspecified: Secondary | ICD-10-CM | POA: Diagnosis not present

## 2021-02-14 DIAGNOSIS — M6249 Contracture of muscle, multiple sites: Secondary | ICD-10-CM | POA: Diagnosis not present

## 2021-02-14 DIAGNOSIS — R2689 Other abnormalities of gait and mobility: Secondary | ICD-10-CM | POA: Diagnosis not present

## 2021-02-14 DIAGNOSIS — R2681 Unsteadiness on feet: Secondary | ICD-10-CM | POA: Diagnosis not present

## 2021-02-14 DIAGNOSIS — M6281 Muscle weakness (generalized): Secondary | ICD-10-CM | POA: Diagnosis not present

## 2021-02-15 DIAGNOSIS — R278 Other lack of coordination: Secondary | ICD-10-CM | POA: Diagnosis not present

## 2021-02-15 DIAGNOSIS — S7291XS Unspecified fracture of right femur, sequela: Secondary | ICD-10-CM | POA: Diagnosis not present

## 2021-02-15 DIAGNOSIS — R2681 Unsteadiness on feet: Secondary | ICD-10-CM | POA: Diagnosis not present

## 2021-02-15 DIAGNOSIS — R2689 Other abnormalities of gait and mobility: Secondary | ICD-10-CM | POA: Diagnosis not present

## 2021-02-15 DIAGNOSIS — M6281 Muscle weakness (generalized): Secondary | ICD-10-CM | POA: Diagnosis not present

## 2021-02-15 DIAGNOSIS — M6249 Contracture of muscle, multiple sites: Secondary | ICD-10-CM | POA: Diagnosis not present

## 2021-02-15 DIAGNOSIS — R1312 Dysphagia, oropharyngeal phase: Secondary | ICD-10-CM | POA: Diagnosis not present

## 2021-02-15 DIAGNOSIS — F0280 Dementia in other diseases classified elsewhere without behavioral disturbance: Secondary | ICD-10-CM | POA: Diagnosis not present

## 2021-02-15 DIAGNOSIS — J449 Chronic obstructive pulmonary disease, unspecified: Secondary | ICD-10-CM | POA: Diagnosis not present

## 2021-02-16 DIAGNOSIS — M6249 Contracture of muscle, multiple sites: Secondary | ICD-10-CM | POA: Diagnosis not present

## 2021-02-16 DIAGNOSIS — M6281 Muscle weakness (generalized): Secondary | ICD-10-CM | POA: Diagnosis not present

## 2021-02-16 DIAGNOSIS — R2689 Other abnormalities of gait and mobility: Secondary | ICD-10-CM | POA: Diagnosis not present

## 2021-02-16 DIAGNOSIS — R278 Other lack of coordination: Secondary | ICD-10-CM | POA: Diagnosis not present

## 2021-02-16 DIAGNOSIS — R2681 Unsteadiness on feet: Secondary | ICD-10-CM | POA: Diagnosis not present

## 2021-02-16 DIAGNOSIS — R1312 Dysphagia, oropharyngeal phase: Secondary | ICD-10-CM | POA: Diagnosis not present

## 2021-02-16 DIAGNOSIS — J449 Chronic obstructive pulmonary disease, unspecified: Secondary | ICD-10-CM | POA: Diagnosis not present

## 2021-02-16 DIAGNOSIS — S7291XS Unspecified fracture of right femur, sequela: Secondary | ICD-10-CM | POA: Diagnosis not present

## 2021-02-16 DIAGNOSIS — F0280 Dementia in other diseases classified elsewhere without behavioral disturbance: Secondary | ICD-10-CM | POA: Diagnosis not present

## 2021-02-17 DIAGNOSIS — S7291XS Unspecified fracture of right femur, sequela: Secondary | ICD-10-CM | POA: Diagnosis not present

## 2021-02-17 DIAGNOSIS — M6249 Contracture of muscle, multiple sites: Secondary | ICD-10-CM | POA: Diagnosis not present

## 2021-02-17 DIAGNOSIS — R2681 Unsteadiness on feet: Secondary | ICD-10-CM | POA: Diagnosis not present

## 2021-02-17 DIAGNOSIS — F0280 Dementia in other diseases classified elsewhere without behavioral disturbance: Secondary | ICD-10-CM | POA: Diagnosis not present

## 2021-02-17 DIAGNOSIS — R1312 Dysphagia, oropharyngeal phase: Secondary | ICD-10-CM | POA: Diagnosis not present

## 2021-02-17 DIAGNOSIS — J449 Chronic obstructive pulmonary disease, unspecified: Secondary | ICD-10-CM | POA: Diagnosis not present

## 2021-02-17 DIAGNOSIS — R278 Other lack of coordination: Secondary | ICD-10-CM | POA: Diagnosis not present

## 2021-02-17 DIAGNOSIS — R2689 Other abnormalities of gait and mobility: Secondary | ICD-10-CM | POA: Diagnosis not present

## 2021-02-17 DIAGNOSIS — M6281 Muscle weakness (generalized): Secondary | ICD-10-CM | POA: Diagnosis not present

## 2021-02-18 DIAGNOSIS — S7291XS Unspecified fracture of right femur, sequela: Secondary | ICD-10-CM | POA: Diagnosis not present

## 2021-02-18 DIAGNOSIS — F0280 Dementia in other diseases classified elsewhere without behavioral disturbance: Secondary | ICD-10-CM | POA: Diagnosis not present

## 2021-02-18 DIAGNOSIS — R278 Other lack of coordination: Secondary | ICD-10-CM | POA: Diagnosis not present

## 2021-02-18 DIAGNOSIS — M6249 Contracture of muscle, multiple sites: Secondary | ICD-10-CM | POA: Diagnosis not present

## 2021-02-18 DIAGNOSIS — R2689 Other abnormalities of gait and mobility: Secondary | ICD-10-CM | POA: Diagnosis not present

## 2021-02-18 DIAGNOSIS — M6281 Muscle weakness (generalized): Secondary | ICD-10-CM | POA: Diagnosis not present

## 2021-02-18 DIAGNOSIS — J449 Chronic obstructive pulmonary disease, unspecified: Secondary | ICD-10-CM | POA: Diagnosis not present

## 2021-02-18 DIAGNOSIS — R1312 Dysphagia, oropharyngeal phase: Secondary | ICD-10-CM | POA: Diagnosis not present

## 2021-02-18 DIAGNOSIS — R2681 Unsteadiness on feet: Secondary | ICD-10-CM | POA: Diagnosis not present

## 2021-02-22 DIAGNOSIS — J449 Chronic obstructive pulmonary disease, unspecified: Secondary | ICD-10-CM | POA: Diagnosis not present

## 2021-02-22 DIAGNOSIS — E441 Mild protein-calorie malnutrition: Secondary | ICD-10-CM | POA: Diagnosis not present

## 2021-02-22 DIAGNOSIS — Z87898 Personal history of other specified conditions: Secondary | ICD-10-CM | POA: Diagnosis not present

## 2021-02-22 DIAGNOSIS — F0390 Unspecified dementia without behavioral disturbance: Secondary | ICD-10-CM | POA: Diagnosis not present

## 2021-02-22 DIAGNOSIS — R262 Difficulty in walking, not elsewhere classified: Secondary | ICD-10-CM | POA: Diagnosis not present

## 2021-03-14 DIAGNOSIS — H524 Presbyopia: Secondary | ICD-10-CM | POA: Diagnosis not present

## 2021-03-14 DIAGNOSIS — Z961 Presence of intraocular lens: Secondary | ICD-10-CM | POA: Diagnosis not present

## 2021-03-14 DIAGNOSIS — H43813 Vitreous degeneration, bilateral: Secondary | ICD-10-CM | POA: Diagnosis not present

## 2021-04-23 IMAGING — CR DG FEMUR 2+V*R*
5 series · 5 of 5 positions shown · non-contrast
Comparison: None.

CLINICAL DATA: Fall, hip pain

EXAM:
PELVIS - 1-2 VIEW; RIGHT FEMUR 2 VIEWS

[x femur distal ap right (1 of 2)]
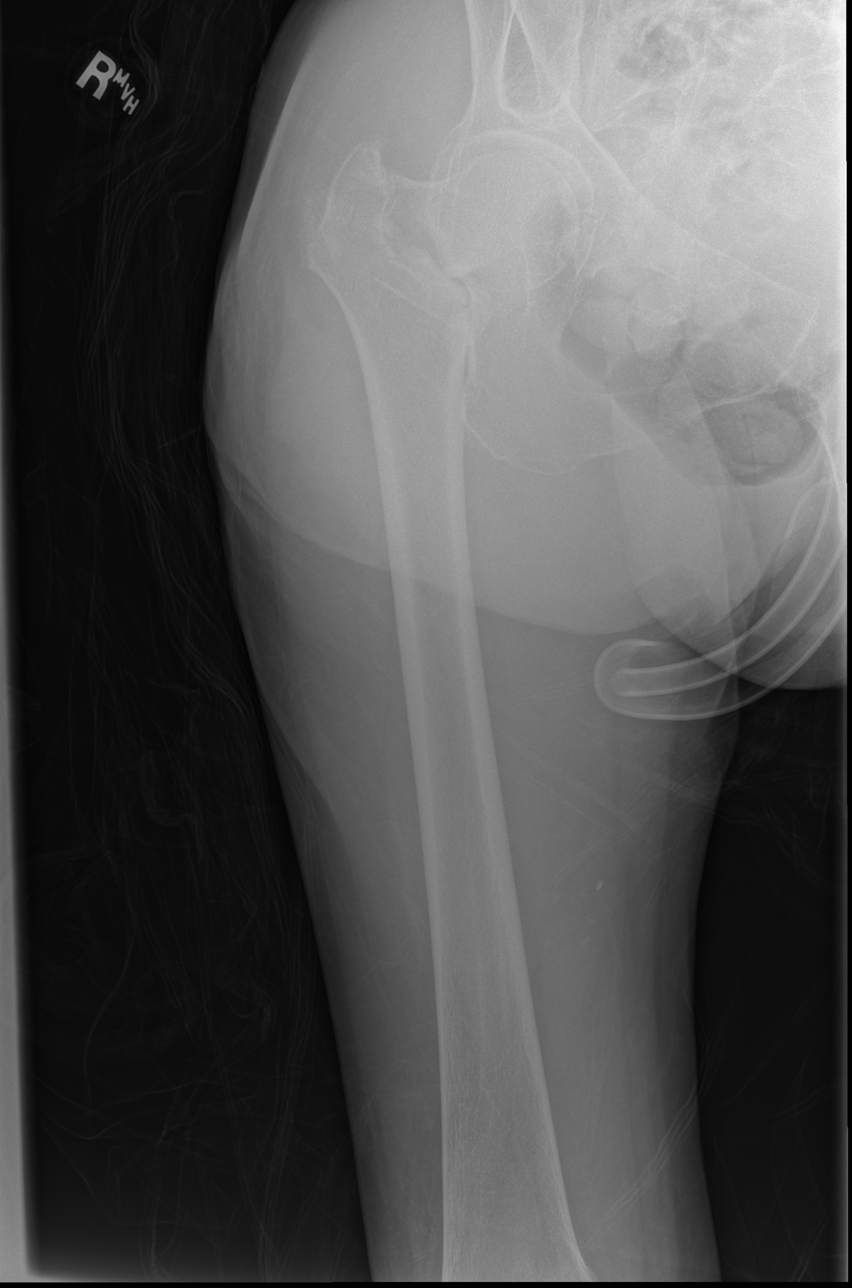

[x femur distal ap right (2 of 2)]
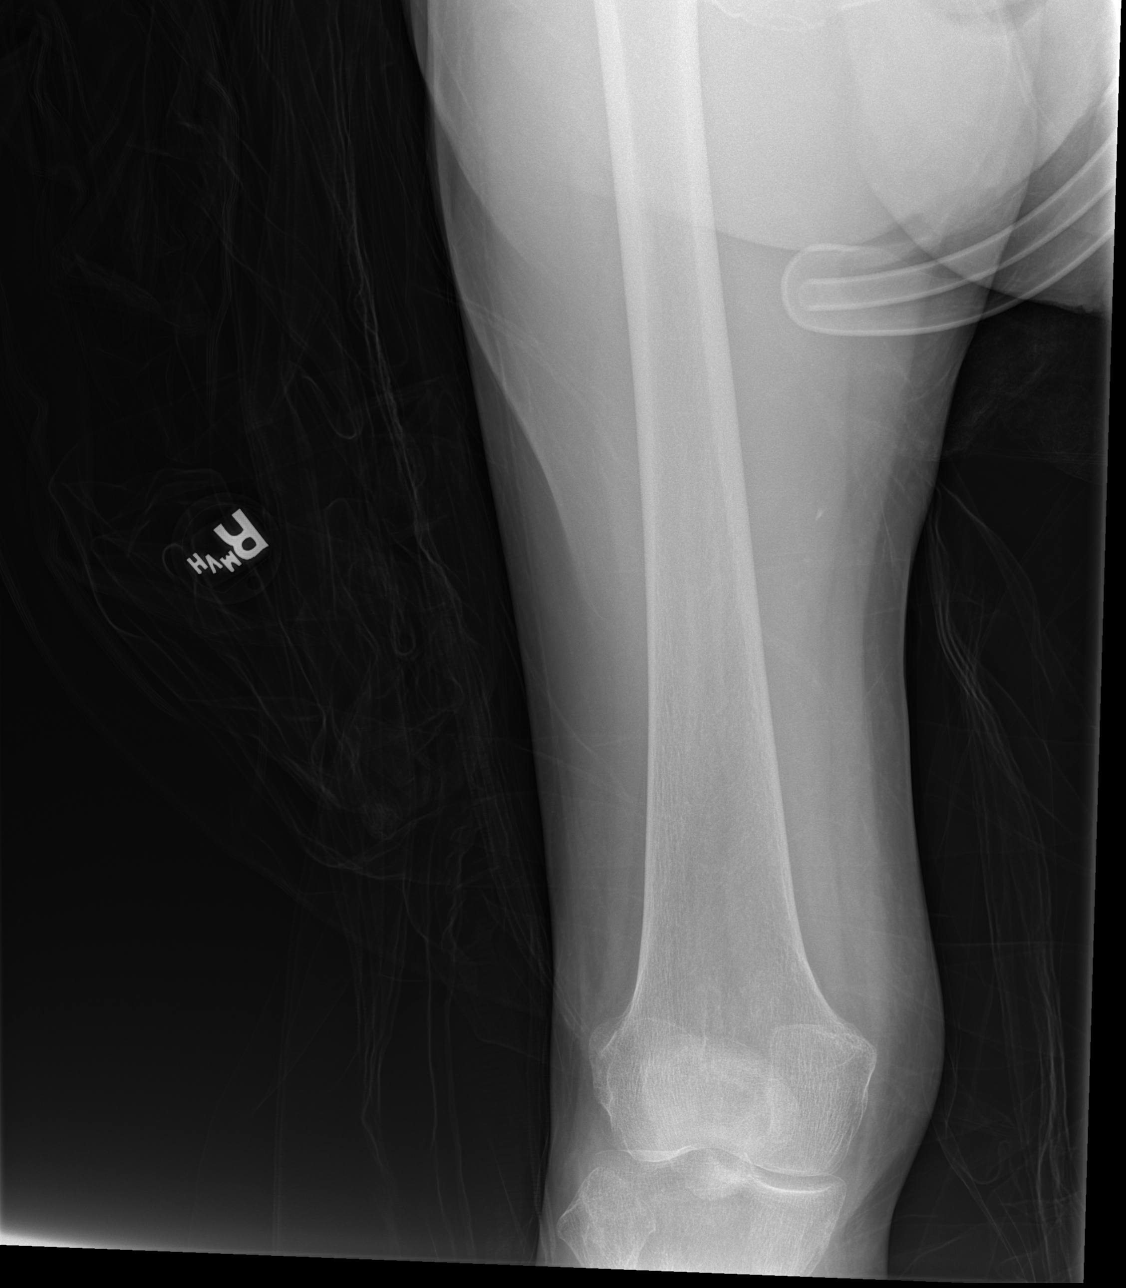

[x femur distal lat right]
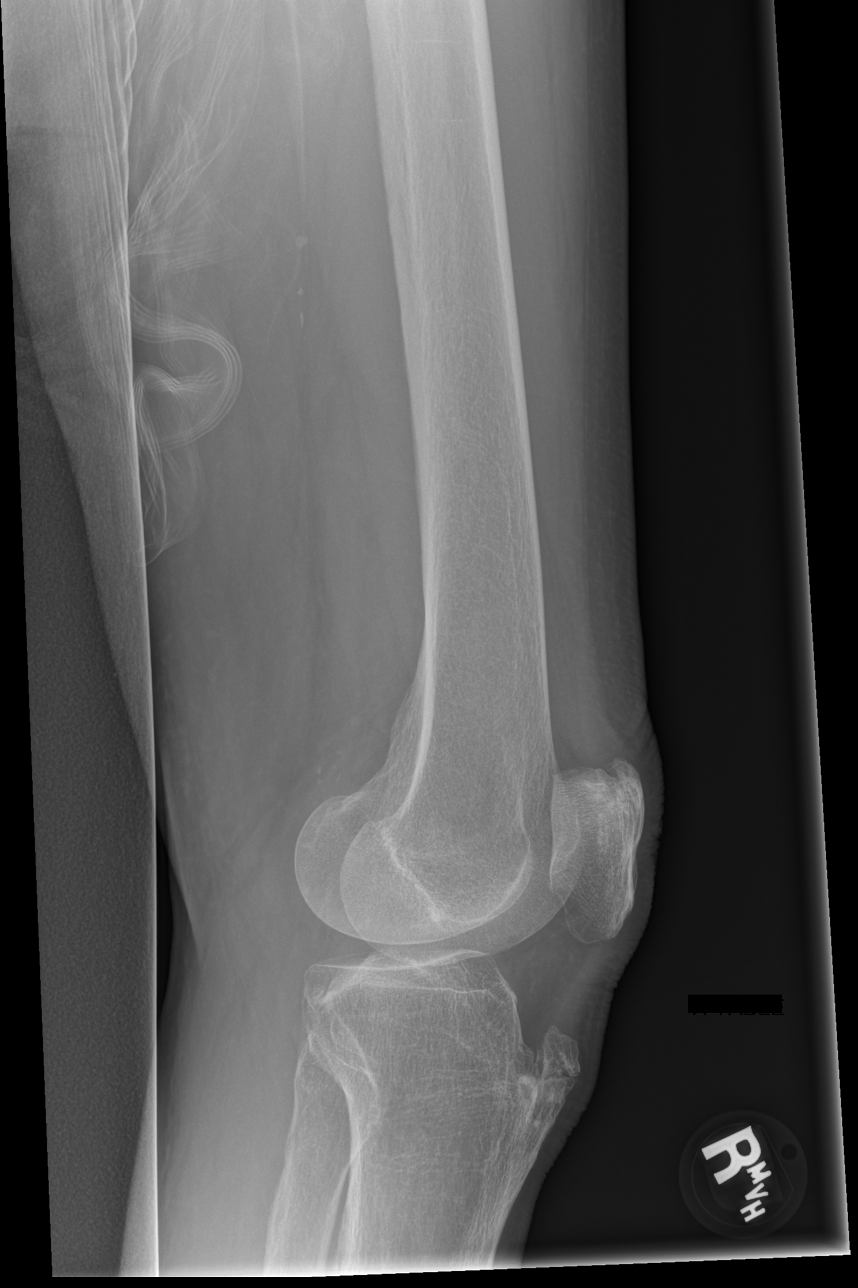

[w hip lat right (1 of 2)]
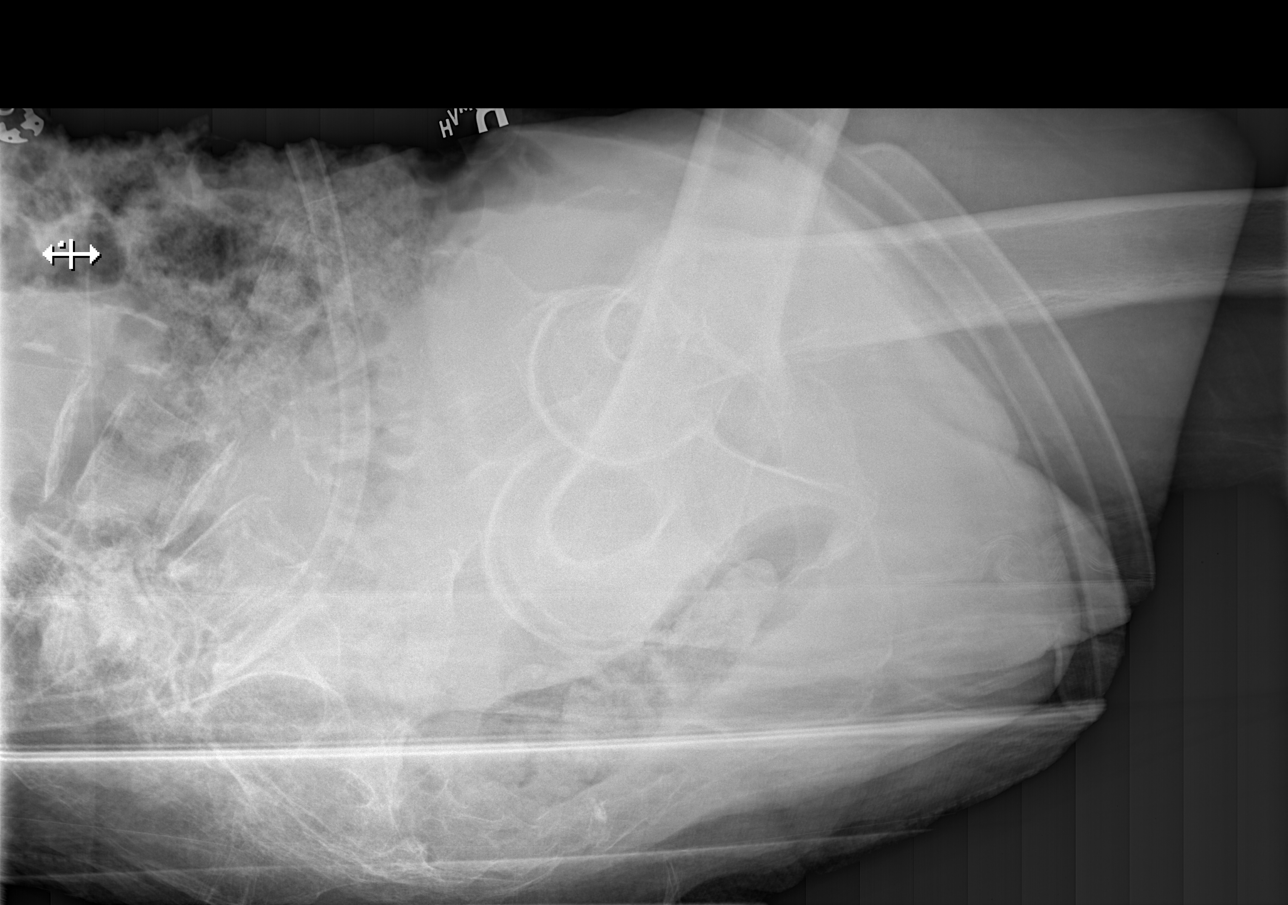

[w hip lat right (2 of 2)]
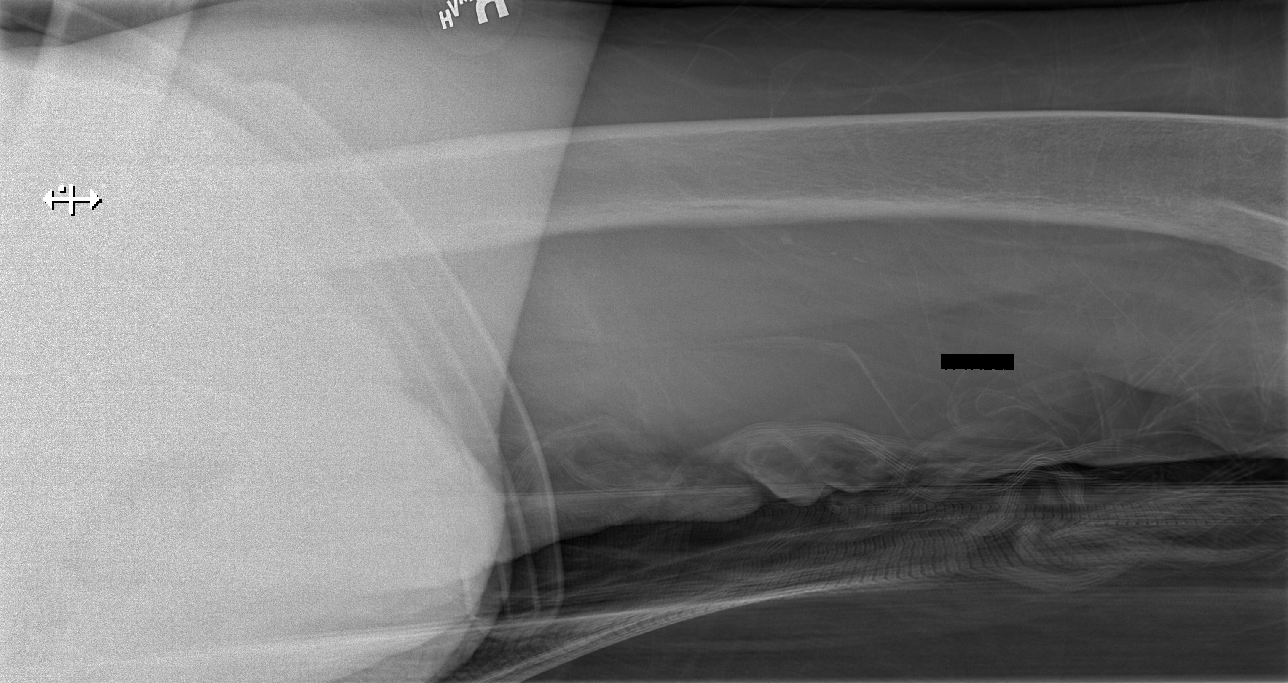

[5 of 5 positions shown; findings below may reference images not displayed]

FINDINGS: Osteopenia. There is a slightly impacted and foreshortened inter
trochanteric fracture of the proximal right femur. No obvious
fracture of the pelvis or proximal left femur seen in single frontal
view.

No fracture or dislocation of the distal right femur.
IMPRESSION: 1. There is a slightly impacted and foreshortened intertrochanteric
fracture of the proximal right femur.

2.  No fracture or dislocation of the distal right femur.

## 2021-04-24 IMAGING — RF DG HIP (WITH PELVIS) OPERATIVE*R*
1 series · 6 of 6 positions shown · non-contrast
Comparison: Preoperative radiograph yesterday.

CLINICAL DATA: Right femoral IM nail.

EXAM:
OPERATIVE RIGHT HIP (WITH PELVIS IF PERFORMED)
TECHNIQUE: Fluoroscopic spot image(s) were submitted for interpretation
post-operatively.

[Series 1: unknown protocol · 0.14mm/px · 6 of 6 slices shown]
[im 1/6]
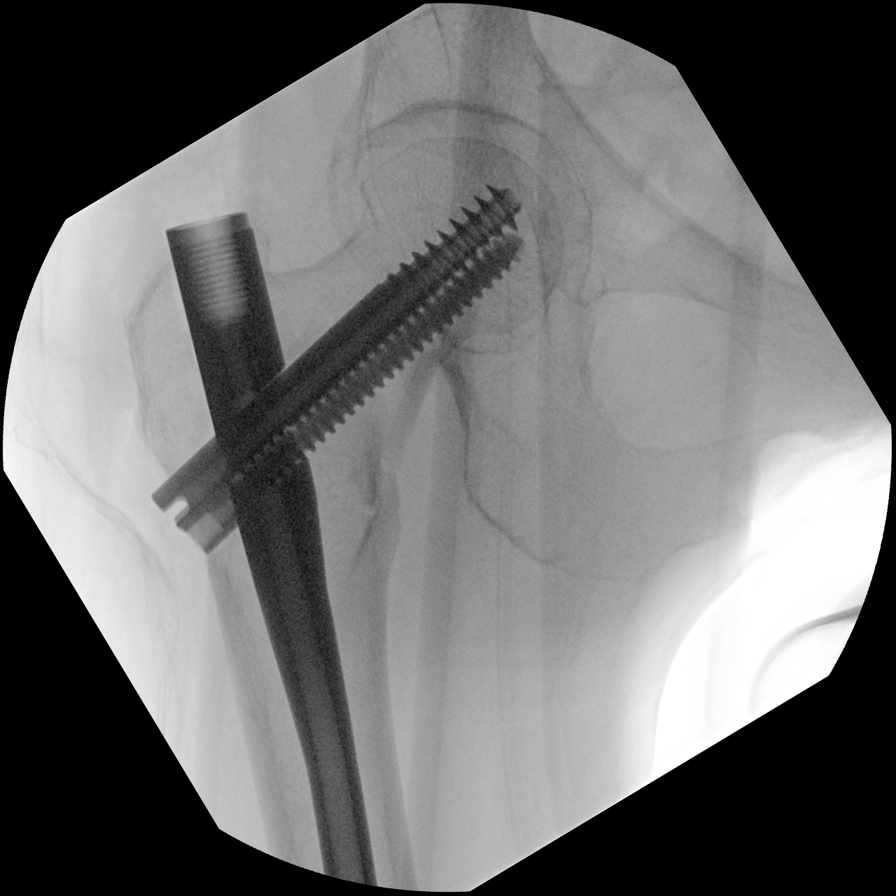
[im 2/6]
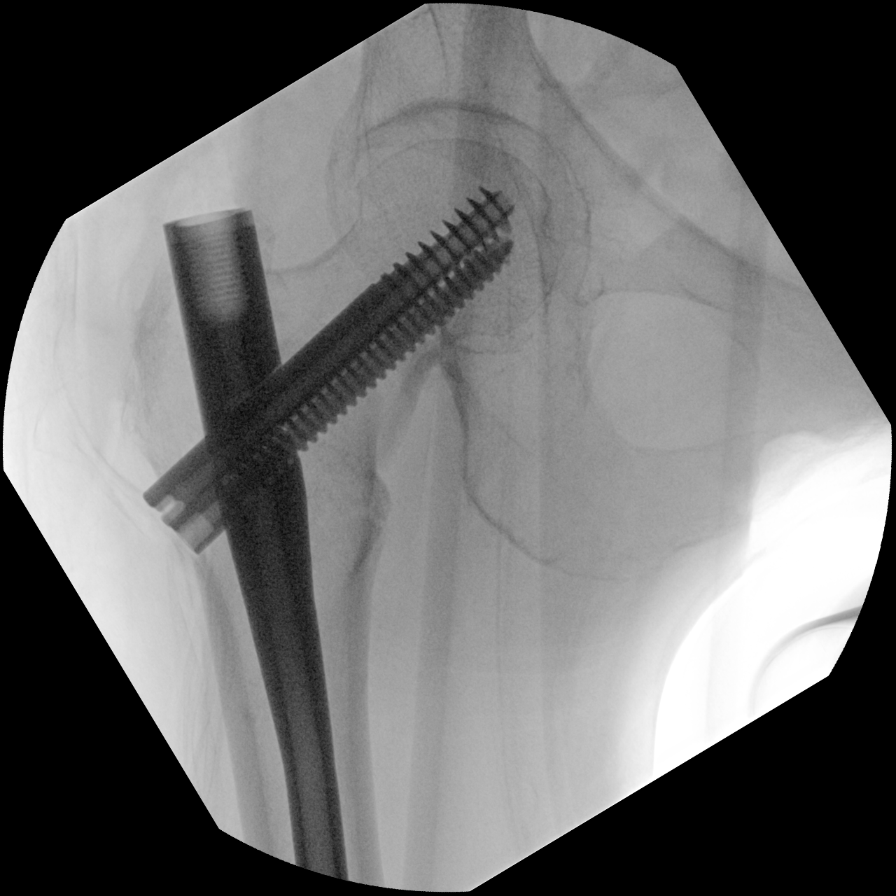
[im 3/6]
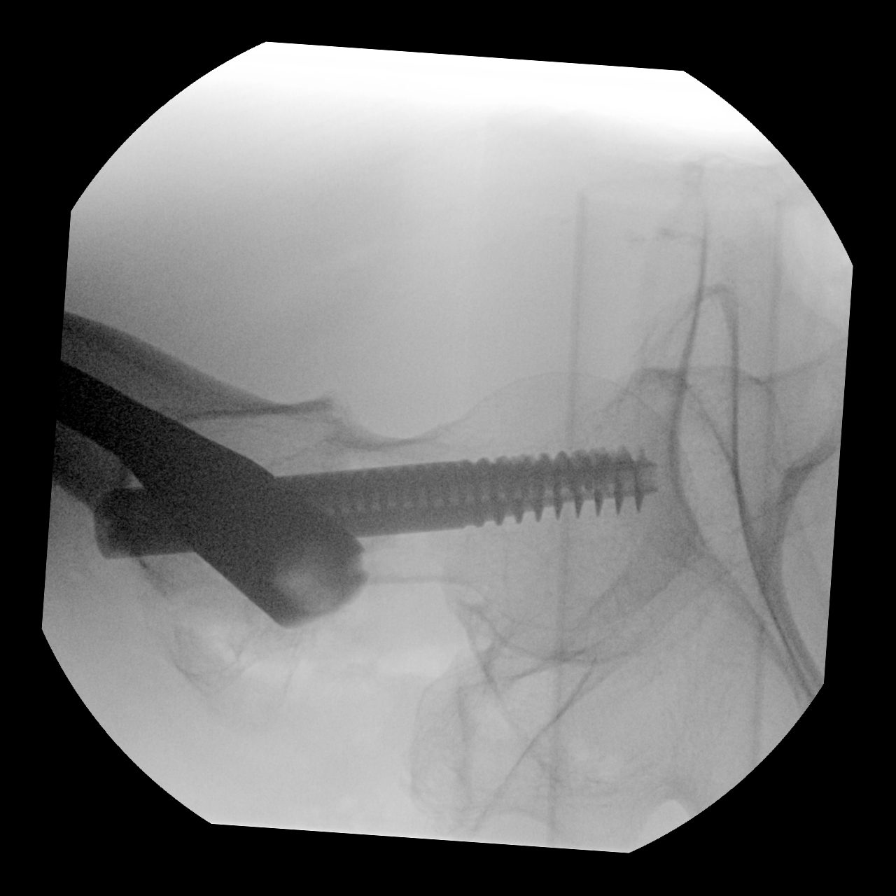
[im 4/6]
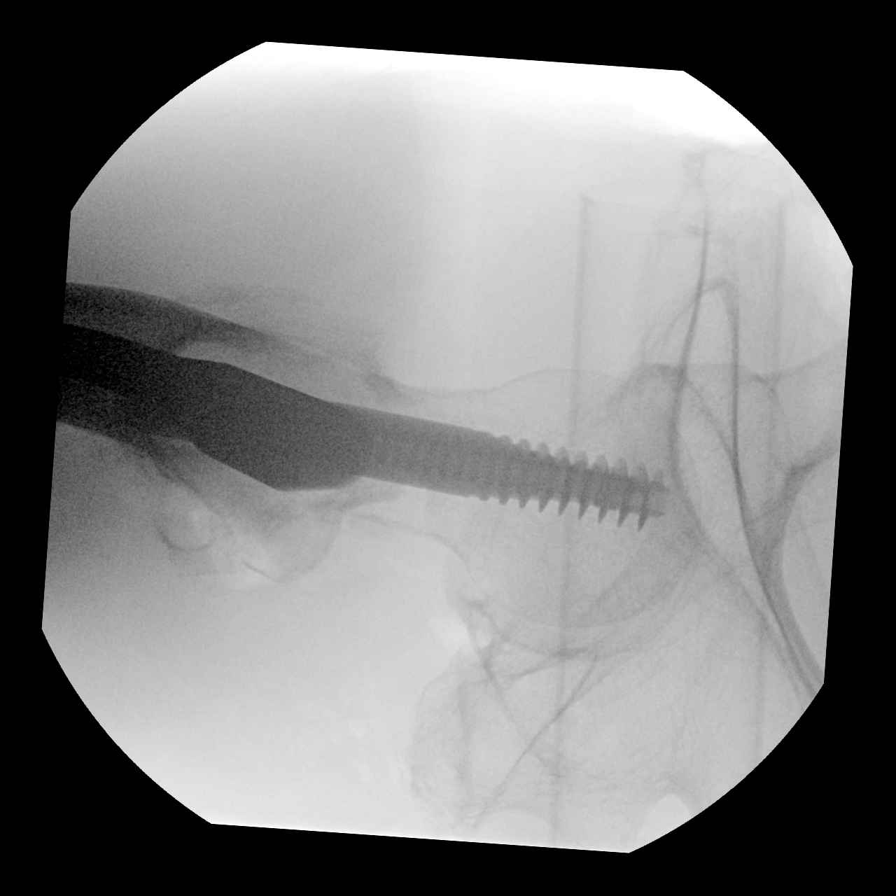
[im 5/6]
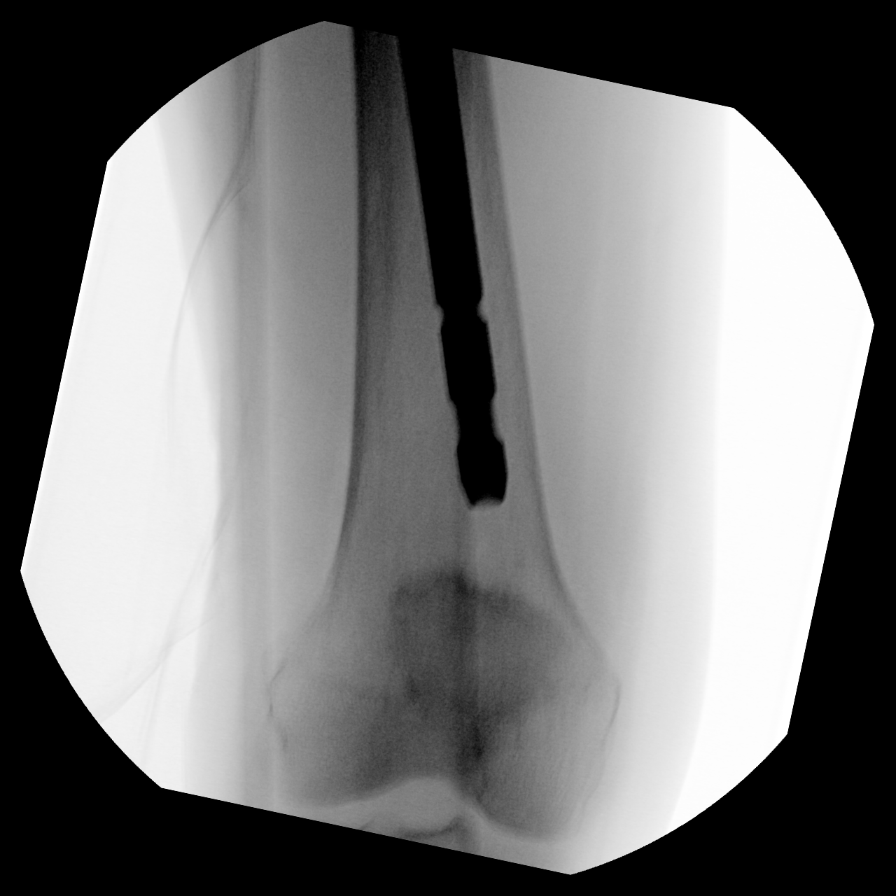
[im 6/6]
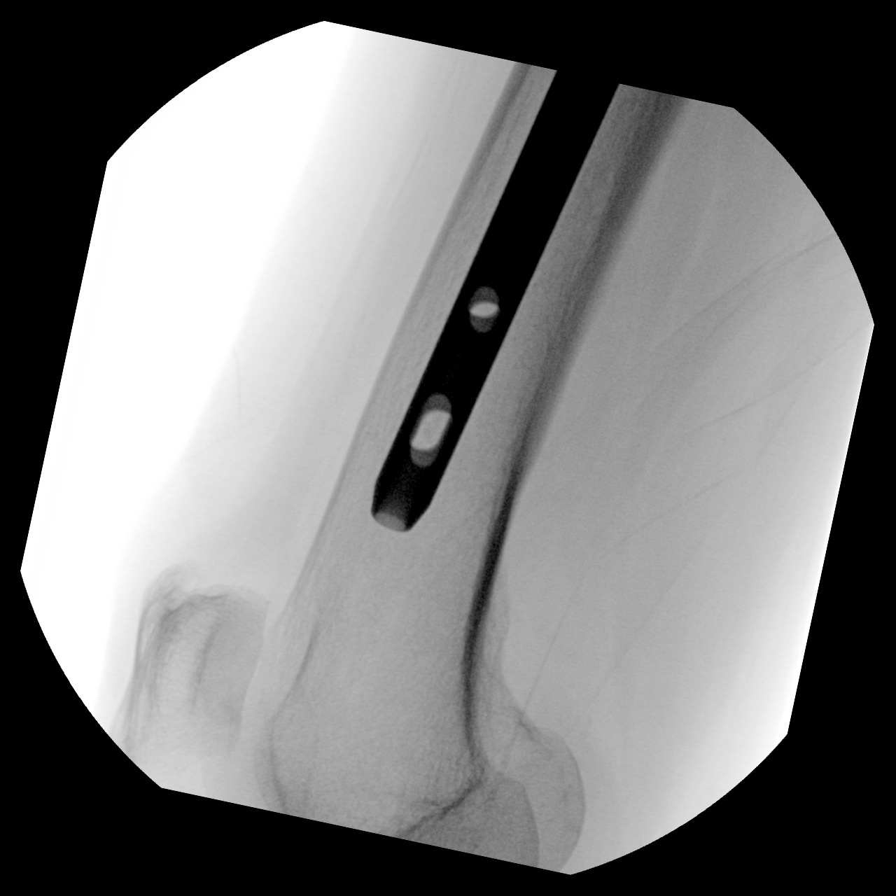

[6 of 6 positions shown; findings below may reference images not displayed]

FINDINGS: Six fluoroscopic spot views obtained in the operating room. Femoral
intramedullary nail with trans trochanteric screw fixation of
intertrochanteric femur fracture. Total fluoroscopy time 1 minutes.
IMPRESSION: Procedural fluoroscopy during fixation of right proximal femur
fracture.

## 2021-04-27 DIAGNOSIS — F39 Unspecified mood [affective] disorder: Secondary | ICD-10-CM | POA: Diagnosis not present

## 2021-04-27 DIAGNOSIS — E441 Mild protein-calorie malnutrition: Secondary | ICD-10-CM | POA: Diagnosis not present

## 2021-04-27 DIAGNOSIS — E559 Vitamin D deficiency, unspecified: Secondary | ICD-10-CM | POA: Diagnosis not present

## 2021-04-27 DIAGNOSIS — E785 Hyperlipidemia, unspecified: Secondary | ICD-10-CM | POA: Diagnosis not present

## 2021-04-27 DIAGNOSIS — J449 Chronic obstructive pulmonary disease, unspecified: Secondary | ICD-10-CM | POA: Diagnosis not present

## 2021-04-27 DIAGNOSIS — I700 Atherosclerosis of aorta: Secondary | ICD-10-CM | POA: Diagnosis not present

## 2021-05-05 DIAGNOSIS — R278 Other lack of coordination: Secondary | ICD-10-CM | POA: Diagnosis not present

## 2021-05-05 DIAGNOSIS — M6281 Muscle weakness (generalized): Secondary | ICD-10-CM | POA: Diagnosis not present

## 2021-05-05 DIAGNOSIS — M6249 Contracture of muscle, multiple sites: Secondary | ICD-10-CM | POA: Diagnosis not present

## 2021-05-05 DIAGNOSIS — I2510 Atherosclerotic heart disease of native coronary artery without angina pectoris: Secondary | ICD-10-CM | POA: Diagnosis not present

## 2021-05-05 DIAGNOSIS — R1312 Dysphagia, oropharyngeal phase: Secondary | ICD-10-CM | POA: Diagnosis not present

## 2021-05-05 DIAGNOSIS — E559 Vitamin D deficiency, unspecified: Secondary | ICD-10-CM | POA: Diagnosis not present

## 2021-05-05 DIAGNOSIS — J449 Chronic obstructive pulmonary disease, unspecified: Secondary | ICD-10-CM | POA: Diagnosis not present

## 2021-05-05 DIAGNOSIS — D464 Refractory anemia, unspecified: Secondary | ICD-10-CM | POA: Diagnosis not present

## 2021-05-05 DIAGNOSIS — R2689 Other abnormalities of gait and mobility: Secondary | ICD-10-CM | POA: Diagnosis not present

## 2021-05-05 DIAGNOSIS — N189 Chronic kidney disease, unspecified: Secondary | ICD-10-CM | POA: Diagnosis not present

## 2021-05-05 DIAGNOSIS — R2681 Unsteadiness on feet: Secondary | ICD-10-CM | POA: Diagnosis not present

## 2021-05-05 DIAGNOSIS — S7291XS Unspecified fracture of right femur, sequela: Secondary | ICD-10-CM | POA: Diagnosis not present

## 2021-05-05 DIAGNOSIS — F0280 Dementia in other diseases classified elsewhere without behavioral disturbance: Secondary | ICD-10-CM | POA: Diagnosis not present

## 2021-05-06 DIAGNOSIS — S7291XS Unspecified fracture of right femur, sequela: Secondary | ICD-10-CM | POA: Diagnosis not present

## 2021-05-06 DIAGNOSIS — J449 Chronic obstructive pulmonary disease, unspecified: Secondary | ICD-10-CM | POA: Diagnosis not present

## 2021-05-06 DIAGNOSIS — R2681 Unsteadiness on feet: Secondary | ICD-10-CM | POA: Diagnosis not present

## 2021-05-06 DIAGNOSIS — R1312 Dysphagia, oropharyngeal phase: Secondary | ICD-10-CM | POA: Diagnosis not present

## 2021-05-06 DIAGNOSIS — M6249 Contracture of muscle, multiple sites: Secondary | ICD-10-CM | POA: Diagnosis not present

## 2021-05-06 DIAGNOSIS — F0280 Dementia in other diseases classified elsewhere without behavioral disturbance: Secondary | ICD-10-CM | POA: Diagnosis not present

## 2021-05-06 DIAGNOSIS — R278 Other lack of coordination: Secondary | ICD-10-CM | POA: Diagnosis not present

## 2021-05-06 DIAGNOSIS — M6281 Muscle weakness (generalized): Secondary | ICD-10-CM | POA: Diagnosis not present

## 2021-05-06 DIAGNOSIS — R2689 Other abnormalities of gait and mobility: Secondary | ICD-10-CM | POA: Diagnosis not present

## 2021-05-07 DIAGNOSIS — S7291XS Unspecified fracture of right femur, sequela: Secondary | ICD-10-CM | POA: Diagnosis not present

## 2021-05-07 DIAGNOSIS — M6281 Muscle weakness (generalized): Secondary | ICD-10-CM | POA: Diagnosis not present

## 2021-05-07 DIAGNOSIS — R2681 Unsteadiness on feet: Secondary | ICD-10-CM | POA: Diagnosis not present

## 2021-05-07 DIAGNOSIS — F0280 Dementia in other diseases classified elsewhere without behavioral disturbance: Secondary | ICD-10-CM | POA: Diagnosis not present

## 2021-05-07 DIAGNOSIS — R2689 Other abnormalities of gait and mobility: Secondary | ICD-10-CM | POA: Diagnosis not present

## 2021-05-07 DIAGNOSIS — R1312 Dysphagia, oropharyngeal phase: Secondary | ICD-10-CM | POA: Diagnosis not present

## 2021-05-07 DIAGNOSIS — M6249 Contracture of muscle, multiple sites: Secondary | ICD-10-CM | POA: Diagnosis not present

## 2021-05-07 DIAGNOSIS — R278 Other lack of coordination: Secondary | ICD-10-CM | POA: Diagnosis not present

## 2021-05-07 DIAGNOSIS — J449 Chronic obstructive pulmonary disease, unspecified: Secondary | ICD-10-CM | POA: Diagnosis not present

## 2021-05-08 DIAGNOSIS — S7291XS Unspecified fracture of right femur, sequela: Secondary | ICD-10-CM | POA: Diagnosis not present

## 2021-05-08 DIAGNOSIS — M6249 Contracture of muscle, multiple sites: Secondary | ICD-10-CM | POA: Diagnosis not present

## 2021-05-08 DIAGNOSIS — F0280 Dementia in other diseases classified elsewhere without behavioral disturbance: Secondary | ICD-10-CM | POA: Diagnosis not present

## 2021-05-08 DIAGNOSIS — R2681 Unsteadiness on feet: Secondary | ICD-10-CM | POA: Diagnosis not present

## 2021-05-08 DIAGNOSIS — R1312 Dysphagia, oropharyngeal phase: Secondary | ICD-10-CM | POA: Diagnosis not present

## 2021-05-08 DIAGNOSIS — R278 Other lack of coordination: Secondary | ICD-10-CM | POA: Diagnosis not present

## 2021-05-08 DIAGNOSIS — R2689 Other abnormalities of gait and mobility: Secondary | ICD-10-CM | POA: Diagnosis not present

## 2021-05-08 DIAGNOSIS — M6281 Muscle weakness (generalized): Secondary | ICD-10-CM | POA: Diagnosis not present

## 2021-05-08 DIAGNOSIS — J449 Chronic obstructive pulmonary disease, unspecified: Secondary | ICD-10-CM | POA: Diagnosis not present

## 2021-05-09 DIAGNOSIS — R2689 Other abnormalities of gait and mobility: Secondary | ICD-10-CM | POA: Diagnosis not present

## 2021-05-09 DIAGNOSIS — F0280 Dementia in other diseases classified elsewhere without behavioral disturbance: Secondary | ICD-10-CM | POA: Diagnosis not present

## 2021-05-09 DIAGNOSIS — J449 Chronic obstructive pulmonary disease, unspecified: Secondary | ICD-10-CM | POA: Diagnosis not present

## 2021-05-09 DIAGNOSIS — R2681 Unsteadiness on feet: Secondary | ICD-10-CM | POA: Diagnosis not present

## 2021-05-09 DIAGNOSIS — R278 Other lack of coordination: Secondary | ICD-10-CM | POA: Diagnosis not present

## 2021-05-09 DIAGNOSIS — R1312 Dysphagia, oropharyngeal phase: Secondary | ICD-10-CM | POA: Diagnosis not present

## 2021-05-09 DIAGNOSIS — M6249 Contracture of muscle, multiple sites: Secondary | ICD-10-CM | POA: Diagnosis not present

## 2021-05-09 DIAGNOSIS — M6281 Muscle weakness (generalized): Secondary | ICD-10-CM | POA: Diagnosis not present

## 2021-05-09 DIAGNOSIS — S7291XS Unspecified fracture of right femur, sequela: Secondary | ICD-10-CM | POA: Diagnosis not present

## 2021-05-10 DIAGNOSIS — M6281 Muscle weakness (generalized): Secondary | ICD-10-CM | POA: Diagnosis not present

## 2021-05-10 DIAGNOSIS — R2681 Unsteadiness on feet: Secondary | ICD-10-CM | POA: Diagnosis not present

## 2021-05-10 DIAGNOSIS — S7291XS Unspecified fracture of right femur, sequela: Secondary | ICD-10-CM | POA: Diagnosis not present

## 2021-05-10 DIAGNOSIS — R2689 Other abnormalities of gait and mobility: Secondary | ICD-10-CM | POA: Diagnosis not present

## 2021-05-10 DIAGNOSIS — J449 Chronic obstructive pulmonary disease, unspecified: Secondary | ICD-10-CM | POA: Diagnosis not present

## 2021-05-10 DIAGNOSIS — R278 Other lack of coordination: Secondary | ICD-10-CM | POA: Diagnosis not present

## 2021-05-10 DIAGNOSIS — R1312 Dysphagia, oropharyngeal phase: Secondary | ICD-10-CM | POA: Diagnosis not present

## 2021-05-10 DIAGNOSIS — M6249 Contracture of muscle, multiple sites: Secondary | ICD-10-CM | POA: Diagnosis not present

## 2021-05-10 DIAGNOSIS — F0280 Dementia in other diseases classified elsewhere without behavioral disturbance: Secondary | ICD-10-CM | POA: Diagnosis not present

## 2021-05-11 DIAGNOSIS — M6249 Contracture of muscle, multiple sites: Secondary | ICD-10-CM | POA: Diagnosis not present

## 2021-05-11 DIAGNOSIS — R278 Other lack of coordination: Secondary | ICD-10-CM | POA: Diagnosis not present

## 2021-05-11 DIAGNOSIS — R2681 Unsteadiness on feet: Secondary | ICD-10-CM | POA: Diagnosis not present

## 2021-05-11 DIAGNOSIS — M6281 Muscle weakness (generalized): Secondary | ICD-10-CM | POA: Diagnosis not present

## 2021-05-11 DIAGNOSIS — R2689 Other abnormalities of gait and mobility: Secondary | ICD-10-CM | POA: Diagnosis not present

## 2021-05-11 DIAGNOSIS — J449 Chronic obstructive pulmonary disease, unspecified: Secondary | ICD-10-CM | POA: Diagnosis not present

## 2021-05-11 DIAGNOSIS — S7291XS Unspecified fracture of right femur, sequela: Secondary | ICD-10-CM | POA: Diagnosis not present

## 2021-05-11 DIAGNOSIS — R1312 Dysphagia, oropharyngeal phase: Secondary | ICD-10-CM | POA: Diagnosis not present

## 2021-05-11 DIAGNOSIS — F0280 Dementia in other diseases classified elsewhere without behavioral disturbance: Secondary | ICD-10-CM | POA: Diagnosis not present

## 2021-05-16 DIAGNOSIS — R2689 Other abnormalities of gait and mobility: Secondary | ICD-10-CM | POA: Diagnosis not present

## 2021-05-16 DIAGNOSIS — R2681 Unsteadiness on feet: Secondary | ICD-10-CM | POA: Diagnosis not present

## 2021-05-16 DIAGNOSIS — J449 Chronic obstructive pulmonary disease, unspecified: Secondary | ICD-10-CM | POA: Diagnosis not present

## 2021-05-16 DIAGNOSIS — M6281 Muscle weakness (generalized): Secondary | ICD-10-CM | POA: Diagnosis not present

## 2021-05-16 DIAGNOSIS — F0280 Dementia in other diseases classified elsewhere without behavioral disturbance: Secondary | ICD-10-CM | POA: Diagnosis not present

## 2021-05-16 DIAGNOSIS — M6249 Contracture of muscle, multiple sites: Secondary | ICD-10-CM | POA: Diagnosis not present

## 2021-05-16 DIAGNOSIS — R278 Other lack of coordination: Secondary | ICD-10-CM | POA: Diagnosis not present

## 2021-05-16 DIAGNOSIS — R1312 Dysphagia, oropharyngeal phase: Secondary | ICD-10-CM | POA: Diagnosis not present

## 2021-05-16 DIAGNOSIS — S7291XS Unspecified fracture of right femur, sequela: Secondary | ICD-10-CM | POA: Diagnosis not present

## 2021-05-17 DIAGNOSIS — R2681 Unsteadiness on feet: Secondary | ICD-10-CM | POA: Diagnosis not present

## 2021-05-17 DIAGNOSIS — M6281 Muscle weakness (generalized): Secondary | ICD-10-CM | POA: Diagnosis not present

## 2021-05-17 DIAGNOSIS — R1312 Dysphagia, oropharyngeal phase: Secondary | ICD-10-CM | POA: Diagnosis not present

## 2021-05-17 DIAGNOSIS — F0280 Dementia in other diseases classified elsewhere without behavioral disturbance: Secondary | ICD-10-CM | POA: Diagnosis not present

## 2021-05-17 DIAGNOSIS — M6249 Contracture of muscle, multiple sites: Secondary | ICD-10-CM | POA: Diagnosis not present

## 2021-05-17 DIAGNOSIS — R278 Other lack of coordination: Secondary | ICD-10-CM | POA: Diagnosis not present

## 2021-05-17 DIAGNOSIS — R2689 Other abnormalities of gait and mobility: Secondary | ICD-10-CM | POA: Diagnosis not present

## 2021-05-17 DIAGNOSIS — S7291XS Unspecified fracture of right femur, sequela: Secondary | ICD-10-CM | POA: Diagnosis not present

## 2021-05-17 DIAGNOSIS — J449 Chronic obstructive pulmonary disease, unspecified: Secondary | ICD-10-CM | POA: Diagnosis not present

## 2021-05-18 DIAGNOSIS — R2689 Other abnormalities of gait and mobility: Secondary | ICD-10-CM | POA: Diagnosis not present

## 2021-05-18 DIAGNOSIS — M6249 Contracture of muscle, multiple sites: Secondary | ICD-10-CM | POA: Diagnosis not present

## 2021-05-18 DIAGNOSIS — J449 Chronic obstructive pulmonary disease, unspecified: Secondary | ICD-10-CM | POA: Diagnosis not present

## 2021-05-18 DIAGNOSIS — F0280 Dementia in other diseases classified elsewhere without behavioral disturbance: Secondary | ICD-10-CM | POA: Diagnosis not present

## 2021-05-18 DIAGNOSIS — M6281 Muscle weakness (generalized): Secondary | ICD-10-CM | POA: Diagnosis not present

## 2021-05-18 DIAGNOSIS — R278 Other lack of coordination: Secondary | ICD-10-CM | POA: Diagnosis not present

## 2021-05-18 DIAGNOSIS — R2681 Unsteadiness on feet: Secondary | ICD-10-CM | POA: Diagnosis not present

## 2021-05-18 DIAGNOSIS — R1312 Dysphagia, oropharyngeal phase: Secondary | ICD-10-CM | POA: Diagnosis not present

## 2021-05-18 DIAGNOSIS — S7291XS Unspecified fracture of right femur, sequela: Secondary | ICD-10-CM | POA: Diagnosis not present

## 2021-05-19 DIAGNOSIS — S7291XS Unspecified fracture of right femur, sequela: Secondary | ICD-10-CM | POA: Diagnosis not present

## 2021-05-19 DIAGNOSIS — R2689 Other abnormalities of gait and mobility: Secondary | ICD-10-CM | POA: Diagnosis not present

## 2021-05-19 DIAGNOSIS — F0280 Dementia in other diseases classified elsewhere without behavioral disturbance: Secondary | ICD-10-CM | POA: Diagnosis not present

## 2021-05-19 DIAGNOSIS — R2681 Unsteadiness on feet: Secondary | ICD-10-CM | POA: Diagnosis not present

## 2021-05-19 DIAGNOSIS — R278 Other lack of coordination: Secondary | ICD-10-CM | POA: Diagnosis not present

## 2021-05-19 DIAGNOSIS — M6249 Contracture of muscle, multiple sites: Secondary | ICD-10-CM | POA: Diagnosis not present

## 2021-05-19 DIAGNOSIS — J449 Chronic obstructive pulmonary disease, unspecified: Secondary | ICD-10-CM | POA: Diagnosis not present

## 2021-05-19 DIAGNOSIS — R1312 Dysphagia, oropharyngeal phase: Secondary | ICD-10-CM | POA: Diagnosis not present

## 2021-05-19 DIAGNOSIS — M6281 Muscle weakness (generalized): Secondary | ICD-10-CM | POA: Diagnosis not present

## 2021-05-21 DIAGNOSIS — R2681 Unsteadiness on feet: Secondary | ICD-10-CM | POA: Diagnosis not present

## 2021-05-21 DIAGNOSIS — S7291XS Unspecified fracture of right femur, sequela: Secondary | ICD-10-CM | POA: Diagnosis not present

## 2021-05-21 DIAGNOSIS — R2689 Other abnormalities of gait and mobility: Secondary | ICD-10-CM | POA: Diagnosis not present

## 2021-05-21 DIAGNOSIS — R1312 Dysphagia, oropharyngeal phase: Secondary | ICD-10-CM | POA: Diagnosis not present

## 2021-05-21 DIAGNOSIS — M6281 Muscle weakness (generalized): Secondary | ICD-10-CM | POA: Diagnosis not present

## 2021-05-21 DIAGNOSIS — J449 Chronic obstructive pulmonary disease, unspecified: Secondary | ICD-10-CM | POA: Diagnosis not present

## 2021-05-21 DIAGNOSIS — M6249 Contracture of muscle, multiple sites: Secondary | ICD-10-CM | POA: Diagnosis not present

## 2021-05-21 DIAGNOSIS — R278 Other lack of coordination: Secondary | ICD-10-CM | POA: Diagnosis not present

## 2021-05-21 DIAGNOSIS — F0280 Dementia in other diseases classified elsewhere without behavioral disturbance: Secondary | ICD-10-CM | POA: Diagnosis not present

## 2021-05-23 DIAGNOSIS — J449 Chronic obstructive pulmonary disease, unspecified: Secondary | ICD-10-CM | POA: Diagnosis not present

## 2021-05-23 DIAGNOSIS — R1312 Dysphagia, oropharyngeal phase: Secondary | ICD-10-CM | POA: Diagnosis not present

## 2021-05-23 DIAGNOSIS — R2681 Unsteadiness on feet: Secondary | ICD-10-CM | POA: Diagnosis not present

## 2021-05-23 DIAGNOSIS — F0280 Dementia in other diseases classified elsewhere without behavioral disturbance: Secondary | ICD-10-CM | POA: Diagnosis not present

## 2021-05-23 DIAGNOSIS — M6249 Contracture of muscle, multiple sites: Secondary | ICD-10-CM | POA: Diagnosis not present

## 2021-05-23 DIAGNOSIS — R2689 Other abnormalities of gait and mobility: Secondary | ICD-10-CM | POA: Diagnosis not present

## 2021-05-23 DIAGNOSIS — M6281 Muscle weakness (generalized): Secondary | ICD-10-CM | POA: Diagnosis not present

## 2021-05-23 DIAGNOSIS — S7291XS Unspecified fracture of right femur, sequela: Secondary | ICD-10-CM | POA: Diagnosis not present

## 2021-05-23 DIAGNOSIS — R278 Other lack of coordination: Secondary | ICD-10-CM | POA: Diagnosis not present

## 2021-05-24 DIAGNOSIS — M6249 Contracture of muscle, multiple sites: Secondary | ICD-10-CM | POA: Diagnosis not present

## 2021-05-24 DIAGNOSIS — J449 Chronic obstructive pulmonary disease, unspecified: Secondary | ICD-10-CM | POA: Diagnosis not present

## 2021-05-24 DIAGNOSIS — S7291XS Unspecified fracture of right femur, sequela: Secondary | ICD-10-CM | POA: Diagnosis not present

## 2021-05-24 DIAGNOSIS — R2681 Unsteadiness on feet: Secondary | ICD-10-CM | POA: Diagnosis not present

## 2021-05-24 DIAGNOSIS — R2689 Other abnormalities of gait and mobility: Secondary | ICD-10-CM | POA: Diagnosis not present

## 2021-05-24 DIAGNOSIS — R1312 Dysphagia, oropharyngeal phase: Secondary | ICD-10-CM | POA: Diagnosis not present

## 2021-05-24 DIAGNOSIS — M6281 Muscle weakness (generalized): Secondary | ICD-10-CM | POA: Diagnosis not present

## 2021-05-24 DIAGNOSIS — R278 Other lack of coordination: Secondary | ICD-10-CM | POA: Diagnosis not present

## 2021-05-24 DIAGNOSIS — F0280 Dementia in other diseases classified elsewhere without behavioral disturbance: Secondary | ICD-10-CM | POA: Diagnosis not present

## 2021-05-25 DIAGNOSIS — R2681 Unsteadiness on feet: Secondary | ICD-10-CM | POA: Diagnosis not present

## 2021-05-25 DIAGNOSIS — F0280 Dementia in other diseases classified elsewhere without behavioral disturbance: Secondary | ICD-10-CM | POA: Diagnosis not present

## 2021-05-25 DIAGNOSIS — M6281 Muscle weakness (generalized): Secondary | ICD-10-CM | POA: Diagnosis not present

## 2021-05-25 DIAGNOSIS — M6249 Contracture of muscle, multiple sites: Secondary | ICD-10-CM | POA: Diagnosis not present

## 2021-05-25 DIAGNOSIS — R278 Other lack of coordination: Secondary | ICD-10-CM | POA: Diagnosis not present

## 2021-05-25 DIAGNOSIS — R1312 Dysphagia, oropharyngeal phase: Secondary | ICD-10-CM | POA: Diagnosis not present

## 2021-05-25 DIAGNOSIS — R2689 Other abnormalities of gait and mobility: Secondary | ICD-10-CM | POA: Diagnosis not present

## 2021-05-25 DIAGNOSIS — J449 Chronic obstructive pulmonary disease, unspecified: Secondary | ICD-10-CM | POA: Diagnosis not present

## 2021-05-25 DIAGNOSIS — S7291XS Unspecified fracture of right femur, sequela: Secondary | ICD-10-CM | POA: Diagnosis not present

## 2021-05-26 DIAGNOSIS — J449 Chronic obstructive pulmonary disease, unspecified: Secondary | ICD-10-CM | POA: Diagnosis not present

## 2021-05-26 DIAGNOSIS — R2689 Other abnormalities of gait and mobility: Secondary | ICD-10-CM | POA: Diagnosis not present

## 2021-05-26 DIAGNOSIS — M6249 Contracture of muscle, multiple sites: Secondary | ICD-10-CM | POA: Diagnosis not present

## 2021-05-26 DIAGNOSIS — S7291XS Unspecified fracture of right femur, sequela: Secondary | ICD-10-CM | POA: Diagnosis not present

## 2021-05-26 DIAGNOSIS — R2681 Unsteadiness on feet: Secondary | ICD-10-CM | POA: Diagnosis not present

## 2021-05-26 DIAGNOSIS — R278 Other lack of coordination: Secondary | ICD-10-CM | POA: Diagnosis not present

## 2021-05-26 DIAGNOSIS — F0280 Dementia in other diseases classified elsewhere without behavioral disturbance: Secondary | ICD-10-CM | POA: Diagnosis not present

## 2021-05-26 DIAGNOSIS — R1312 Dysphagia, oropharyngeal phase: Secondary | ICD-10-CM | POA: Diagnosis not present

## 2021-05-26 DIAGNOSIS — M6281 Muscle weakness (generalized): Secondary | ICD-10-CM | POA: Diagnosis not present

## 2021-06-01 DIAGNOSIS — R278 Other lack of coordination: Secondary | ICD-10-CM | POA: Diagnosis not present

## 2021-06-01 DIAGNOSIS — F0280 Dementia in other diseases classified elsewhere without behavioral disturbance: Secondary | ICD-10-CM | POA: Diagnosis not present

## 2021-06-01 DIAGNOSIS — R2689 Other abnormalities of gait and mobility: Secondary | ICD-10-CM | POA: Diagnosis not present

## 2021-06-01 DIAGNOSIS — R1312 Dysphagia, oropharyngeal phase: Secondary | ICD-10-CM | POA: Diagnosis not present

## 2021-06-01 DIAGNOSIS — R2681 Unsteadiness on feet: Secondary | ICD-10-CM | POA: Diagnosis not present

## 2021-06-01 DIAGNOSIS — J449 Chronic obstructive pulmonary disease, unspecified: Secondary | ICD-10-CM | POA: Diagnosis not present

## 2021-06-01 DIAGNOSIS — M6281 Muscle weakness (generalized): Secondary | ICD-10-CM | POA: Diagnosis not present

## 2021-06-01 DIAGNOSIS — S7291XS Unspecified fracture of right femur, sequela: Secondary | ICD-10-CM | POA: Diagnosis not present

## 2021-06-01 DIAGNOSIS — M6249 Contracture of muscle, multiple sites: Secondary | ICD-10-CM | POA: Diagnosis not present

## 2021-06-02 DIAGNOSIS — R278 Other lack of coordination: Secondary | ICD-10-CM | POA: Diagnosis not present

## 2021-06-02 DIAGNOSIS — M6249 Contracture of muscle, multiple sites: Secondary | ICD-10-CM | POA: Diagnosis not present

## 2021-06-02 DIAGNOSIS — F0280 Dementia in other diseases classified elsewhere without behavioral disturbance: Secondary | ICD-10-CM | POA: Diagnosis not present

## 2021-06-02 DIAGNOSIS — M6281 Muscle weakness (generalized): Secondary | ICD-10-CM | POA: Diagnosis not present

## 2021-06-02 DIAGNOSIS — J449 Chronic obstructive pulmonary disease, unspecified: Secondary | ICD-10-CM | POA: Diagnosis not present

## 2021-06-02 DIAGNOSIS — S7291XS Unspecified fracture of right femur, sequela: Secondary | ICD-10-CM | POA: Diagnosis not present

## 2021-06-02 DIAGNOSIS — R2689 Other abnormalities of gait and mobility: Secondary | ICD-10-CM | POA: Diagnosis not present

## 2021-06-02 DIAGNOSIS — R2681 Unsteadiness on feet: Secondary | ICD-10-CM | POA: Diagnosis not present

## 2021-06-02 DIAGNOSIS — R1312 Dysphagia, oropharyngeal phase: Secondary | ICD-10-CM | POA: Diagnosis not present

## 2021-06-03 DIAGNOSIS — J449 Chronic obstructive pulmonary disease, unspecified: Secondary | ICD-10-CM | POA: Diagnosis not present

## 2021-06-03 DIAGNOSIS — R278 Other lack of coordination: Secondary | ICD-10-CM | POA: Diagnosis not present

## 2021-06-03 DIAGNOSIS — M6249 Contracture of muscle, multiple sites: Secondary | ICD-10-CM | POA: Diagnosis not present

## 2021-06-03 DIAGNOSIS — S7291XS Unspecified fracture of right femur, sequela: Secondary | ICD-10-CM | POA: Diagnosis not present

## 2021-06-03 DIAGNOSIS — R2689 Other abnormalities of gait and mobility: Secondary | ICD-10-CM | POA: Diagnosis not present

## 2021-06-03 DIAGNOSIS — R2681 Unsteadiness on feet: Secondary | ICD-10-CM | POA: Diagnosis not present

## 2021-06-03 DIAGNOSIS — F0280 Dementia in other diseases classified elsewhere without behavioral disturbance: Secondary | ICD-10-CM | POA: Diagnosis not present

## 2021-06-03 DIAGNOSIS — M6281 Muscle weakness (generalized): Secondary | ICD-10-CM | POA: Diagnosis not present

## 2021-06-03 DIAGNOSIS — R1312 Dysphagia, oropharyngeal phase: Secondary | ICD-10-CM | POA: Diagnosis not present

## 2021-06-08 DIAGNOSIS — F03918 Unspecified dementia, unspecified severity, with other behavioral disturbance: Secondary | ICD-10-CM | POA: Diagnosis not present

## 2021-06-28 DIAGNOSIS — R030 Elevated blood-pressure reading, without diagnosis of hypertension: Secondary | ICD-10-CM | POA: Diagnosis not present

## 2021-06-28 DIAGNOSIS — R262 Difficulty in walking, not elsewhere classified: Secondary | ICD-10-CM | POA: Diagnosis not present

## 2021-06-28 DIAGNOSIS — E870 Hyperosmolality and hypernatremia: Secondary | ICD-10-CM | POA: Diagnosis not present

## 2021-06-28 DIAGNOSIS — Z87898 Personal history of other specified conditions: Secondary | ICD-10-CM | POA: Diagnosis not present

## 2021-06-28 DIAGNOSIS — E441 Mild protein-calorie malnutrition: Secondary | ICD-10-CM | POA: Diagnosis not present

## 2021-06-28 DIAGNOSIS — J449 Chronic obstructive pulmonary disease, unspecified: Secondary | ICD-10-CM | POA: Diagnosis not present

## 2021-06-28 DIAGNOSIS — F0390 Unspecified dementia without behavioral disturbance: Secondary | ICD-10-CM | POA: Diagnosis not present

## 2021-06-28 DIAGNOSIS — Z0189 Encounter for other specified special examinations: Secondary | ICD-10-CM | POA: Diagnosis not present

## 2021-07-05 DIAGNOSIS — Z23 Encounter for immunization: Secondary | ICD-10-CM | POA: Diagnosis not present

## 2021-07-12 DIAGNOSIS — R41841 Cognitive communication deficit: Secondary | ICD-10-CM | POA: Diagnosis not present

## 2021-07-12 DIAGNOSIS — F0280 Dementia in other diseases classified elsewhere without behavioral disturbance: Secondary | ICD-10-CM | POA: Diagnosis not present

## 2021-07-12 DIAGNOSIS — R1312 Dysphagia, oropharyngeal phase: Secondary | ICD-10-CM | POA: Diagnosis not present

## 2021-07-13 DIAGNOSIS — R41841 Cognitive communication deficit: Secondary | ICD-10-CM | POA: Diagnosis not present

## 2021-07-13 DIAGNOSIS — F0280 Dementia in other diseases classified elsewhere without behavioral disturbance: Secondary | ICD-10-CM | POA: Diagnosis not present

## 2021-07-13 DIAGNOSIS — R1312 Dysphagia, oropharyngeal phase: Secondary | ICD-10-CM | POA: Diagnosis not present

## 2021-07-14 DIAGNOSIS — F0280 Dementia in other diseases classified elsewhere without behavioral disturbance: Secondary | ICD-10-CM | POA: Diagnosis not present

## 2021-07-14 DIAGNOSIS — R41841 Cognitive communication deficit: Secondary | ICD-10-CM | POA: Diagnosis not present

## 2021-07-14 DIAGNOSIS — R1312 Dysphagia, oropharyngeal phase: Secondary | ICD-10-CM | POA: Diagnosis not present

## 2021-07-15 DIAGNOSIS — F0280 Dementia in other diseases classified elsewhere without behavioral disturbance: Secondary | ICD-10-CM | POA: Diagnosis not present

## 2021-07-15 DIAGNOSIS — R1312 Dysphagia, oropharyngeal phase: Secondary | ICD-10-CM | POA: Diagnosis not present

## 2021-07-15 DIAGNOSIS — R41841 Cognitive communication deficit: Secondary | ICD-10-CM | POA: Diagnosis not present

## 2021-07-18 DIAGNOSIS — F0280 Dementia in other diseases classified elsewhere without behavioral disturbance: Secondary | ICD-10-CM | POA: Diagnosis not present

## 2021-07-18 DIAGNOSIS — R1312 Dysphagia, oropharyngeal phase: Secondary | ICD-10-CM | POA: Diagnosis not present

## 2021-07-18 DIAGNOSIS — R41841 Cognitive communication deficit: Secondary | ICD-10-CM | POA: Diagnosis not present

## 2021-07-19 DIAGNOSIS — R1312 Dysphagia, oropharyngeal phase: Secondary | ICD-10-CM | POA: Diagnosis not present

## 2021-07-19 DIAGNOSIS — R41841 Cognitive communication deficit: Secondary | ICD-10-CM | POA: Diagnosis not present

## 2021-07-19 DIAGNOSIS — F0280 Dementia in other diseases classified elsewhere without behavioral disturbance: Secondary | ICD-10-CM | POA: Diagnosis not present

## 2021-07-20 DIAGNOSIS — R1312 Dysphagia, oropharyngeal phase: Secondary | ICD-10-CM | POA: Diagnosis not present

## 2021-07-20 DIAGNOSIS — F0280 Dementia in other diseases classified elsewhere without behavioral disturbance: Secondary | ICD-10-CM | POA: Diagnosis not present

## 2021-07-20 DIAGNOSIS — R41841 Cognitive communication deficit: Secondary | ICD-10-CM | POA: Diagnosis not present

## 2021-07-21 DIAGNOSIS — R1312 Dysphagia, oropharyngeal phase: Secondary | ICD-10-CM | POA: Diagnosis not present

## 2021-07-21 DIAGNOSIS — R41841 Cognitive communication deficit: Secondary | ICD-10-CM | POA: Diagnosis not present

## 2021-07-21 DIAGNOSIS — F0280 Dementia in other diseases classified elsewhere without behavioral disturbance: Secondary | ICD-10-CM | POA: Diagnosis not present

## 2021-07-22 DIAGNOSIS — F0280 Dementia in other diseases classified elsewhere without behavioral disturbance: Secondary | ICD-10-CM | POA: Diagnosis not present

## 2021-07-22 DIAGNOSIS — R41841 Cognitive communication deficit: Secondary | ICD-10-CM | POA: Diagnosis not present

## 2021-07-22 DIAGNOSIS — R1312 Dysphagia, oropharyngeal phase: Secondary | ICD-10-CM | POA: Diagnosis not present

## 2021-07-25 DIAGNOSIS — F0280 Dementia in other diseases classified elsewhere without behavioral disturbance: Secondary | ICD-10-CM | POA: Diagnosis not present

## 2021-07-25 DIAGNOSIS — R1312 Dysphagia, oropharyngeal phase: Secondary | ICD-10-CM | POA: Diagnosis not present

## 2021-07-25 DIAGNOSIS — R41841 Cognitive communication deficit: Secondary | ICD-10-CM | POA: Diagnosis not present

## 2021-07-28 DIAGNOSIS — B351 Tinea unguium: Secondary | ICD-10-CM | POA: Diagnosis not present

## 2021-07-28 DIAGNOSIS — L603 Nail dystrophy: Secondary | ICD-10-CM | POA: Diagnosis not present

## 2021-07-28 DIAGNOSIS — I739 Peripheral vascular disease, unspecified: Secondary | ICD-10-CM | POA: Diagnosis not present

## 2021-09-02 DIAGNOSIS — N182 Chronic kidney disease, stage 2 (mild): Secondary | ICD-10-CM | POA: Diagnosis not present

## 2021-09-02 DIAGNOSIS — E870 Hyperosmolality and hypernatremia: Secondary | ICD-10-CM | POA: Diagnosis not present

## 2021-09-02 DIAGNOSIS — F0390 Unspecified dementia without behavioral disturbance: Secondary | ICD-10-CM | POA: Diagnosis not present

## 2021-09-02 DIAGNOSIS — K5900 Constipation, unspecified: Secondary | ICD-10-CM | POA: Diagnosis not present

## 2021-09-02 DIAGNOSIS — E559 Vitamin D deficiency, unspecified: Secondary | ICD-10-CM | POA: Diagnosis not present

## 2021-09-02 DIAGNOSIS — J449 Chronic obstructive pulmonary disease, unspecified: Secondary | ICD-10-CM | POA: Diagnosis not present

## 2021-09-02 DIAGNOSIS — I700 Atherosclerosis of aorta: Secondary | ICD-10-CM | POA: Diagnosis not present

## 2021-09-02 DIAGNOSIS — F39 Unspecified mood [affective] disorder: Secondary | ICD-10-CM | POA: Diagnosis not present

## 2021-09-08 DIAGNOSIS — J069 Acute upper respiratory infection, unspecified: Secondary | ICD-10-CM | POA: Diagnosis not present

## 2021-09-08 DIAGNOSIS — J40 Bronchitis, not specified as acute or chronic: Secondary | ICD-10-CM | POA: Diagnosis not present

## 2021-09-12 DIAGNOSIS — E119 Type 2 diabetes mellitus without complications: Secondary | ICD-10-CM | POA: Diagnosis not present

## 2021-09-12 DIAGNOSIS — E559 Vitamin D deficiency, unspecified: Secondary | ICD-10-CM | POA: Diagnosis not present

## 2021-09-28 DIAGNOSIS — E870 Hyperosmolality and hypernatremia: Secondary | ICD-10-CM | POA: Diagnosis not present

## 2021-09-28 DIAGNOSIS — J449 Chronic obstructive pulmonary disease, unspecified: Secondary | ICD-10-CM | POA: Diagnosis not present

## 2021-09-28 DIAGNOSIS — Z0189 Encounter for other specified special examinations: Secondary | ICD-10-CM | POA: Diagnosis not present

## 2021-09-28 DIAGNOSIS — E559 Vitamin D deficiency, unspecified: Secondary | ICD-10-CM | POA: Diagnosis not present

## 2021-10-03 DIAGNOSIS — J449 Chronic obstructive pulmonary disease, unspecified: Secondary | ICD-10-CM | POA: Diagnosis not present

## 2021-10-03 DIAGNOSIS — J302 Other seasonal allergic rhinitis: Secondary | ICD-10-CM | POA: Diagnosis not present

## 2021-10-10 DIAGNOSIS — R1312 Dysphagia, oropharyngeal phase: Secondary | ICD-10-CM | POA: Diagnosis not present

## 2021-10-10 DIAGNOSIS — M6281 Muscle weakness (generalized): Secondary | ICD-10-CM | POA: Diagnosis not present

## 2021-10-11 DIAGNOSIS — M6281 Muscle weakness (generalized): Secondary | ICD-10-CM | POA: Diagnosis not present

## 2021-10-11 DIAGNOSIS — R1312 Dysphagia, oropharyngeal phase: Secondary | ICD-10-CM | POA: Diagnosis not present

## 2021-10-12 DIAGNOSIS — R1312 Dysphagia, oropharyngeal phase: Secondary | ICD-10-CM | POA: Diagnosis not present

## 2021-10-12 DIAGNOSIS — M6281 Muscle weakness (generalized): Secondary | ICD-10-CM | POA: Diagnosis not present

## 2021-10-13 DIAGNOSIS — M6281 Muscle weakness (generalized): Secondary | ICD-10-CM | POA: Diagnosis not present

## 2021-10-13 DIAGNOSIS — R1312 Dysphagia, oropharyngeal phase: Secondary | ICD-10-CM | POA: Diagnosis not present

## 2021-10-14 DIAGNOSIS — E441 Mild protein-calorie malnutrition: Secondary | ICD-10-CM | POA: Diagnosis not present

## 2021-10-14 DIAGNOSIS — J449 Chronic obstructive pulmonary disease, unspecified: Secondary | ICD-10-CM | POA: Diagnosis not present

## 2021-10-14 DIAGNOSIS — R1312 Dysphagia, oropharyngeal phase: Secondary | ICD-10-CM | POA: Diagnosis not present

## 2021-10-14 DIAGNOSIS — J302 Other seasonal allergic rhinitis: Secondary | ICD-10-CM | POA: Diagnosis not present

## 2021-10-14 DIAGNOSIS — M6281 Muscle weakness (generalized): Secondary | ICD-10-CM | POA: Diagnosis not present

## 2021-10-16 DIAGNOSIS — M6281 Muscle weakness (generalized): Secondary | ICD-10-CM | POA: Diagnosis not present

## 2021-10-16 DIAGNOSIS — R1312 Dysphagia, oropharyngeal phase: Secondary | ICD-10-CM | POA: Diagnosis not present

## 2021-10-17 DIAGNOSIS — R1312 Dysphagia, oropharyngeal phase: Secondary | ICD-10-CM | POA: Diagnosis not present

## 2021-10-17 DIAGNOSIS — F03B18 Unspecified dementia, moderate, with other behavioral disturbance: Secondary | ICD-10-CM | POA: Diagnosis not present

## 2021-10-17 DIAGNOSIS — M6281 Muscle weakness (generalized): Secondary | ICD-10-CM | POA: Diagnosis not present

## 2021-10-18 DIAGNOSIS — R1312 Dysphagia, oropharyngeal phase: Secondary | ICD-10-CM | POA: Diagnosis not present

## 2021-10-18 DIAGNOSIS — M6281 Muscle weakness (generalized): Secondary | ICD-10-CM | POA: Diagnosis not present

## 2021-10-19 DIAGNOSIS — R1312 Dysphagia, oropharyngeal phase: Secondary | ICD-10-CM | POA: Diagnosis not present

## 2021-10-19 DIAGNOSIS — M6281 Muscle weakness (generalized): Secondary | ICD-10-CM | POA: Diagnosis not present

## 2021-10-20 DIAGNOSIS — R1312 Dysphagia, oropharyngeal phase: Secondary | ICD-10-CM | POA: Diagnosis not present

## 2021-10-20 DIAGNOSIS — M6281 Muscle weakness (generalized): Secondary | ICD-10-CM | POA: Diagnosis not present

## 2021-10-24 DIAGNOSIS — M6281 Muscle weakness (generalized): Secondary | ICD-10-CM | POA: Diagnosis not present

## 2021-10-24 DIAGNOSIS — R1312 Dysphagia, oropharyngeal phase: Secondary | ICD-10-CM | POA: Diagnosis not present

## 2021-10-25 DIAGNOSIS — R030 Elevated blood-pressure reading, without diagnosis of hypertension: Secondary | ICD-10-CM | POA: Diagnosis not present

## 2021-10-25 DIAGNOSIS — E441 Mild protein-calorie malnutrition: Secondary | ICD-10-CM | POA: Diagnosis not present

## 2021-10-25 DIAGNOSIS — J449 Chronic obstructive pulmonary disease, unspecified: Secondary | ICD-10-CM | POA: Diagnosis not present

## 2021-10-25 DIAGNOSIS — Z87898 Personal history of other specified conditions: Secondary | ICD-10-CM | POA: Diagnosis not present

## 2021-10-25 DIAGNOSIS — R1312 Dysphagia, oropharyngeal phase: Secondary | ICD-10-CM | POA: Diagnosis not present

## 2021-10-25 DIAGNOSIS — R262 Difficulty in walking, not elsewhere classified: Secondary | ICD-10-CM | POA: Diagnosis not present

## 2021-10-25 DIAGNOSIS — M6281 Muscle weakness (generalized): Secondary | ICD-10-CM | POA: Diagnosis not present

## 2021-10-25 DIAGNOSIS — F0390 Unspecified dementia without behavioral disturbance: Secondary | ICD-10-CM | POA: Diagnosis not present

## 2021-10-26 DIAGNOSIS — R1312 Dysphagia, oropharyngeal phase: Secondary | ICD-10-CM | POA: Diagnosis not present

## 2021-10-26 DIAGNOSIS — M6281 Muscle weakness (generalized): Secondary | ICD-10-CM | POA: Diagnosis not present

## 2021-10-27 DIAGNOSIS — R1312 Dysphagia, oropharyngeal phase: Secondary | ICD-10-CM | POA: Diagnosis not present

## 2021-10-27 DIAGNOSIS — M6281 Muscle weakness (generalized): Secondary | ICD-10-CM | POA: Diagnosis not present

## 2021-10-28 DIAGNOSIS — R1312 Dysphagia, oropharyngeal phase: Secondary | ICD-10-CM | POA: Diagnosis not present

## 2021-10-28 DIAGNOSIS — M6281 Muscle weakness (generalized): Secondary | ICD-10-CM | POA: Diagnosis not present

## 2021-11-01 DIAGNOSIS — M6281 Muscle weakness (generalized): Secondary | ICD-10-CM | POA: Diagnosis not present

## 2021-11-01 DIAGNOSIS — R1312 Dysphagia, oropharyngeal phase: Secondary | ICD-10-CM | POA: Diagnosis not present

## 2021-12-22 DIAGNOSIS — E559 Vitamin D deficiency, unspecified: Secondary | ICD-10-CM | POA: Diagnosis not present

## 2021-12-22 DIAGNOSIS — E441 Mild protein-calorie malnutrition: Secondary | ICD-10-CM | POA: Diagnosis not present

## 2021-12-22 DIAGNOSIS — E785 Hyperlipidemia, unspecified: Secondary | ICD-10-CM | POA: Diagnosis not present

## 2021-12-22 DIAGNOSIS — N182 Chronic kidney disease, stage 2 (mild): Secondary | ICD-10-CM | POA: Diagnosis not present

## 2021-12-22 DIAGNOSIS — J449 Chronic obstructive pulmonary disease, unspecified: Secondary | ICD-10-CM | POA: Diagnosis not present

## 2022-01-05 DIAGNOSIS — J449 Chronic obstructive pulmonary disease, unspecified: Secondary | ICD-10-CM | POA: Diagnosis not present

## 2022-01-05 DIAGNOSIS — F03C0 Unspecified dementia, severe, without behavioral disturbance, psychotic disturbance, mood disturbance, and anxiety: Secondary | ICD-10-CM | POA: Diagnosis not present

## 2022-01-05 DIAGNOSIS — J209 Acute bronchitis, unspecified: Secondary | ICD-10-CM | POA: Diagnosis not present

## 2022-01-09 DIAGNOSIS — F0280 Dementia in other diseases classified elsewhere without behavioral disturbance: Secondary | ICD-10-CM | POA: Diagnosis not present

## 2022-01-09 DIAGNOSIS — R41841 Cognitive communication deficit: Secondary | ICD-10-CM | POA: Diagnosis not present

## 2022-01-09 DIAGNOSIS — R1312 Dysphagia, oropharyngeal phase: Secondary | ICD-10-CM | POA: Diagnosis not present

## 2022-01-10 DIAGNOSIS — R1312 Dysphagia, oropharyngeal phase: Secondary | ICD-10-CM | POA: Diagnosis not present

## 2022-01-10 DIAGNOSIS — R41841 Cognitive communication deficit: Secondary | ICD-10-CM | POA: Diagnosis not present

## 2022-01-10 DIAGNOSIS — F0280 Dementia in other diseases classified elsewhere without behavioral disturbance: Secondary | ICD-10-CM | POA: Diagnosis not present

## 2022-01-11 DIAGNOSIS — R41841 Cognitive communication deficit: Secondary | ICD-10-CM | POA: Diagnosis not present

## 2022-01-11 DIAGNOSIS — R1312 Dysphagia, oropharyngeal phase: Secondary | ICD-10-CM | POA: Diagnosis not present

## 2022-01-11 DIAGNOSIS — F0280 Dementia in other diseases classified elsewhere without behavioral disturbance: Secondary | ICD-10-CM | POA: Diagnosis not present

## 2022-01-12 DIAGNOSIS — F0280 Dementia in other diseases classified elsewhere without behavioral disturbance: Secondary | ICD-10-CM | POA: Diagnosis not present

## 2022-01-12 DIAGNOSIS — R1312 Dysphagia, oropharyngeal phase: Secondary | ICD-10-CM | POA: Diagnosis not present

## 2022-01-12 DIAGNOSIS — R41841 Cognitive communication deficit: Secondary | ICD-10-CM | POA: Diagnosis not present

## 2022-01-13 DIAGNOSIS — R41841 Cognitive communication deficit: Secondary | ICD-10-CM | POA: Diagnosis not present

## 2022-01-13 DIAGNOSIS — F0280 Dementia in other diseases classified elsewhere without behavioral disturbance: Secondary | ICD-10-CM | POA: Diagnosis not present

## 2022-01-13 DIAGNOSIS — R1312 Dysphagia, oropharyngeal phase: Secondary | ICD-10-CM | POA: Diagnosis not present

## 2022-01-13 DIAGNOSIS — I739 Peripheral vascular disease, unspecified: Secondary | ICD-10-CM | POA: Diagnosis not present

## 2022-01-13 DIAGNOSIS — B351 Tinea unguium: Secondary | ICD-10-CM | POA: Diagnosis not present

## 2022-01-16 DIAGNOSIS — R1312 Dysphagia, oropharyngeal phase: Secondary | ICD-10-CM | POA: Diagnosis not present

## 2022-01-16 DIAGNOSIS — F0280 Dementia in other diseases classified elsewhere without behavioral disturbance: Secondary | ICD-10-CM | POA: Diagnosis not present

## 2022-01-16 DIAGNOSIS — R41841 Cognitive communication deficit: Secondary | ICD-10-CM | POA: Diagnosis not present

## 2022-01-17 DIAGNOSIS — F0280 Dementia in other diseases classified elsewhere without behavioral disturbance: Secondary | ICD-10-CM | POA: Diagnosis not present

## 2022-01-17 DIAGNOSIS — R1312 Dysphagia, oropharyngeal phase: Secondary | ICD-10-CM | POA: Diagnosis not present

## 2022-01-17 DIAGNOSIS — R41841 Cognitive communication deficit: Secondary | ICD-10-CM | POA: Diagnosis not present

## 2022-01-19 DIAGNOSIS — R1312 Dysphagia, oropharyngeal phase: Secondary | ICD-10-CM | POA: Diagnosis not present

## 2022-01-19 DIAGNOSIS — F0280 Dementia in other diseases classified elsewhere without behavioral disturbance: Secondary | ICD-10-CM | POA: Diagnosis not present

## 2022-01-19 DIAGNOSIS — R41841 Cognitive communication deficit: Secondary | ICD-10-CM | POA: Diagnosis not present

## 2022-01-19 DIAGNOSIS — R1310 Dysphagia, unspecified: Secondary | ICD-10-CM | POA: Diagnosis not present

## 2022-01-19 DIAGNOSIS — J449 Chronic obstructive pulmonary disease, unspecified: Secondary | ICD-10-CM | POA: Diagnosis not present

## 2022-01-21 DIAGNOSIS — R1312 Dysphagia, oropharyngeal phase: Secondary | ICD-10-CM | POA: Diagnosis not present

## 2022-01-21 DIAGNOSIS — R41841 Cognitive communication deficit: Secondary | ICD-10-CM | POA: Diagnosis not present

## 2022-01-21 DIAGNOSIS — F0280 Dementia in other diseases classified elsewhere without behavioral disturbance: Secondary | ICD-10-CM | POA: Diagnosis not present

## 2022-01-23 DIAGNOSIS — R1312 Dysphagia, oropharyngeal phase: Secondary | ICD-10-CM | POA: Diagnosis not present

## 2022-01-23 DIAGNOSIS — F0280 Dementia in other diseases classified elsewhere without behavioral disturbance: Secondary | ICD-10-CM | POA: Diagnosis not present

## 2022-01-23 DIAGNOSIS — R41841 Cognitive communication deficit: Secondary | ICD-10-CM | POA: Diagnosis not present

## 2022-02-20 DIAGNOSIS — F03B18 Unspecified dementia, moderate, with other behavioral disturbance: Secondary | ICD-10-CM | POA: Diagnosis not present

## 2022-03-03 DIAGNOSIS — Z6823 Body mass index (BMI) 23.0-23.9, adult: Secondary | ICD-10-CM | POA: Diagnosis not present

## 2022-03-03 DIAGNOSIS — Z8679 Personal history of other diseases of the circulatory system: Secondary | ICD-10-CM | POA: Diagnosis not present

## 2022-03-03 DIAGNOSIS — J449 Chronic obstructive pulmonary disease, unspecified: Secondary | ICD-10-CM | POA: Diagnosis not present

## 2022-03-03 DIAGNOSIS — R1310 Dysphagia, unspecified: Secondary | ICD-10-CM | POA: Diagnosis not present

## 2022-03-03 DIAGNOSIS — R262 Difficulty in walking, not elsewhere classified: Secondary | ICD-10-CM | POA: Diagnosis not present

## 2022-03-03 DIAGNOSIS — E441 Mild protein-calorie malnutrition: Secondary | ICD-10-CM | POA: Diagnosis not present

## 2022-03-03 DIAGNOSIS — F0390 Unspecified dementia without behavioral disturbance: Secondary | ICD-10-CM | POA: Diagnosis not present

## 2022-03-03 DIAGNOSIS — Z09 Encounter for follow-up examination after completed treatment for conditions other than malignant neoplasm: Secondary | ICD-10-CM | POA: Diagnosis not present

## 2022-03-21 DIAGNOSIS — E119 Type 2 diabetes mellitus without complications: Secondary | ICD-10-CM | POA: Diagnosis not present

## 2022-03-25 DIAGNOSIS — D519 Vitamin B12 deficiency anemia, unspecified: Secondary | ICD-10-CM | POA: Diagnosis not present

## 2022-04-04 DIAGNOSIS — R1310 Dysphagia, unspecified: Secondary | ICD-10-CM | POA: Diagnosis not present

## 2022-04-04 DIAGNOSIS — Z0189 Encounter for other specified special examinations: Secondary | ICD-10-CM | POA: Diagnosis not present

## 2022-04-04 DIAGNOSIS — J449 Chronic obstructive pulmonary disease, unspecified: Secondary | ICD-10-CM | POA: Diagnosis not present

## 2022-04-07 DIAGNOSIS — M6281 Muscle weakness (generalized): Secondary | ICD-10-CM | POA: Diagnosis not present

## 2022-04-10 DIAGNOSIS — M6281 Muscle weakness (generalized): Secondary | ICD-10-CM | POA: Diagnosis not present

## 2022-04-11 DIAGNOSIS — M6281 Muscle weakness (generalized): Secondary | ICD-10-CM | POA: Diagnosis not present

## 2022-04-12 DIAGNOSIS — M6281 Muscle weakness (generalized): Secondary | ICD-10-CM | POA: Diagnosis not present

## 2022-04-13 DIAGNOSIS — M6281 Muscle weakness (generalized): Secondary | ICD-10-CM | POA: Diagnosis not present

## 2022-04-14 DIAGNOSIS — M6281 Muscle weakness (generalized): Secondary | ICD-10-CM | POA: Diagnosis not present

## 2022-04-17 DIAGNOSIS — M6281 Muscle weakness (generalized): Secondary | ICD-10-CM | POA: Diagnosis not present

## 2022-05-12 DIAGNOSIS — J449 Chronic obstructive pulmonary disease, unspecified: Secondary | ICD-10-CM | POA: Diagnosis not present

## 2022-05-12 DIAGNOSIS — F03C0 Unspecified dementia, severe, without behavioral disturbance, psychotic disturbance, mood disturbance, and anxiety: Secondary | ICD-10-CM | POA: Diagnosis not present

## 2022-05-12 DIAGNOSIS — Z7982 Long term (current) use of aspirin: Secondary | ICD-10-CM | POA: Diagnosis not present

## 2022-05-12 DIAGNOSIS — N182 Chronic kidney disease, stage 2 (mild): Secondary | ICD-10-CM | POA: Diagnosis not present

## 2022-05-12 DIAGNOSIS — R1310 Dysphagia, unspecified: Secondary | ICD-10-CM | POA: Diagnosis not present

## 2022-05-22 DIAGNOSIS — F03B18 Unspecified dementia, moderate, with other behavioral disturbance: Secondary | ICD-10-CM | POA: Diagnosis not present

## 2022-06-30 DIAGNOSIS — R059 Cough, unspecified: Secondary | ICD-10-CM | POA: Diagnosis not present

## 2022-07-03 DIAGNOSIS — J189 Pneumonia, unspecified organism: Secondary | ICD-10-CM | POA: Diagnosis not present

## 2022-07-03 DIAGNOSIS — Z9981 Dependence on supplemental oxygen: Secondary | ICD-10-CM | POA: Diagnosis not present

## 2022-07-03 DIAGNOSIS — J449 Chronic obstructive pulmonary disease, unspecified: Secondary | ICD-10-CM | POA: Diagnosis not present

## 2022-07-03 DIAGNOSIS — Z6823 Body mass index (BMI) 23.0-23.9, adult: Secondary | ICD-10-CM | POA: Diagnosis not present

## 2022-07-03 DIAGNOSIS — Z7189 Other specified counseling: Secondary | ICD-10-CM | POA: Diagnosis not present

## 2022-07-03 DIAGNOSIS — E441 Mild protein-calorie malnutrition: Secondary | ICD-10-CM | POA: Diagnosis not present

## 2022-07-10 DIAGNOSIS — R41841 Cognitive communication deficit: Secondary | ICD-10-CM | POA: Diagnosis not present

## 2022-07-10 DIAGNOSIS — R1311 Dysphagia, oral phase: Secondary | ICD-10-CM | POA: Diagnosis not present

## 2022-07-11 DIAGNOSIS — R1311 Dysphagia, oral phase: Secondary | ICD-10-CM | POA: Diagnosis not present

## 2022-07-11 DIAGNOSIS — R41841 Cognitive communication deficit: Secondary | ICD-10-CM | POA: Diagnosis not present

## 2022-07-12 DIAGNOSIS — R41841 Cognitive communication deficit: Secondary | ICD-10-CM | POA: Diagnosis not present

## 2022-07-12 DIAGNOSIS — R1311 Dysphagia, oral phase: Secondary | ICD-10-CM | POA: Diagnosis not present

## 2022-07-13 DIAGNOSIS — R1311 Dysphagia, oral phase: Secondary | ICD-10-CM | POA: Diagnosis not present

## 2022-07-13 DIAGNOSIS — R41841 Cognitive communication deficit: Secondary | ICD-10-CM | POA: Diagnosis not present

## 2022-07-14 DIAGNOSIS — R1311 Dysphagia, oral phase: Secondary | ICD-10-CM | POA: Diagnosis not present

## 2022-07-14 DIAGNOSIS — R41841 Cognitive communication deficit: Secondary | ICD-10-CM | POA: Diagnosis not present

## 2022-07-17 DIAGNOSIS — R1311 Dysphagia, oral phase: Secondary | ICD-10-CM | POA: Diagnosis not present

## 2022-07-17 DIAGNOSIS — R41841 Cognitive communication deficit: Secondary | ICD-10-CM | POA: Diagnosis not present

## 2022-07-19 DIAGNOSIS — R1311 Dysphagia, oral phase: Secondary | ICD-10-CM | POA: Diagnosis not present

## 2022-07-19 DIAGNOSIS — R41841 Cognitive communication deficit: Secondary | ICD-10-CM | POA: Diagnosis not present

## 2022-07-20 DIAGNOSIS — R41841 Cognitive communication deficit: Secondary | ICD-10-CM | POA: Diagnosis not present

## 2022-07-20 DIAGNOSIS — R1311 Dysphagia, oral phase: Secondary | ICD-10-CM | POA: Diagnosis not present

## 2022-08-15 DIAGNOSIS — J449 Chronic obstructive pulmonary disease, unspecified: Secondary | ICD-10-CM | POA: Diagnosis not present

## 2022-08-15 DIAGNOSIS — F03C0 Unspecified dementia, severe, without behavioral disturbance, psychotic disturbance, mood disturbance, and anxiety: Secondary | ICD-10-CM | POA: Diagnosis not present

## 2022-08-15 DIAGNOSIS — E785 Hyperlipidemia, unspecified: Secondary | ICD-10-CM | POA: Diagnosis not present

## 2022-08-15 DIAGNOSIS — E559 Vitamin D deficiency, unspecified: Secondary | ICD-10-CM | POA: Diagnosis not present

## 2022-08-15 DIAGNOSIS — N182 Chronic kidney disease, stage 2 (mild): Secondary | ICD-10-CM | POA: Diagnosis not present

## 2022-08-16 DIAGNOSIS — E441 Mild protein-calorie malnutrition: Secondary | ICD-10-CM | POA: Diagnosis not present

## 2022-08-16 DIAGNOSIS — N182 Chronic kidney disease, stage 2 (mild): Secondary | ICD-10-CM | POA: Diagnosis not present

## 2022-08-16 DIAGNOSIS — J449 Chronic obstructive pulmonary disease, unspecified: Secondary | ICD-10-CM | POA: Diagnosis not present

## 2022-08-16 DIAGNOSIS — Z6823 Body mass index (BMI) 23.0-23.9, adult: Secondary | ICD-10-CM | POA: Diagnosis not present

## 2022-08-16 DIAGNOSIS — R600 Localized edema: Secondary | ICD-10-CM | POA: Diagnosis not present

## 2022-08-17 DIAGNOSIS — E119 Type 2 diabetes mellitus without complications: Secondary | ICD-10-CM | POA: Diagnosis not present

## 2022-08-17 DIAGNOSIS — E559 Vitamin D deficiency, unspecified: Secondary | ICD-10-CM | POA: Diagnosis not present

## 2022-08-28 DIAGNOSIS — F03B18 Unspecified dementia, moderate, with other behavioral disturbance: Secondary | ICD-10-CM | POA: Diagnosis not present

## 2022-09-29 DIAGNOSIS — I739 Peripheral vascular disease, unspecified: Secondary | ICD-10-CM | POA: Diagnosis not present

## 2022-09-29 DIAGNOSIS — L84 Corns and callosities: Secondary | ICD-10-CM | POA: Diagnosis not present

## 2022-09-29 DIAGNOSIS — B351 Tinea unguium: Secondary | ICD-10-CM | POA: Diagnosis not present

## 2022-10-09 DIAGNOSIS — S7291XS Unspecified fracture of right femur, sequela: Secondary | ICD-10-CM | POA: Diagnosis not present

## 2022-10-09 DIAGNOSIS — R1310 Dysphagia, unspecified: Secondary | ICD-10-CM | POA: Diagnosis not present

## 2022-10-09 DIAGNOSIS — R41841 Cognitive communication deficit: Secondary | ICD-10-CM | POA: Diagnosis not present

## 2022-10-10 DIAGNOSIS — R41841 Cognitive communication deficit: Secondary | ICD-10-CM | POA: Diagnosis not present

## 2022-10-10 DIAGNOSIS — R1310 Dysphagia, unspecified: Secondary | ICD-10-CM | POA: Diagnosis not present

## 2022-10-10 DIAGNOSIS — S7291XS Unspecified fracture of right femur, sequela: Secondary | ICD-10-CM | POA: Diagnosis not present

## 2022-10-11 DIAGNOSIS — R41841 Cognitive communication deficit: Secondary | ICD-10-CM | POA: Diagnosis not present

## 2022-10-11 DIAGNOSIS — S7291XS Unspecified fracture of right femur, sequela: Secondary | ICD-10-CM | POA: Diagnosis not present

## 2022-10-11 DIAGNOSIS — R1310 Dysphagia, unspecified: Secondary | ICD-10-CM | POA: Diagnosis not present

## 2022-10-12 DIAGNOSIS — R41841 Cognitive communication deficit: Secondary | ICD-10-CM | POA: Diagnosis not present

## 2022-10-12 DIAGNOSIS — R1310 Dysphagia, unspecified: Secondary | ICD-10-CM | POA: Diagnosis not present

## 2022-10-12 DIAGNOSIS — S7291XS Unspecified fracture of right femur, sequela: Secondary | ICD-10-CM | POA: Diagnosis not present

## 2022-10-13 DIAGNOSIS — S7291XS Unspecified fracture of right femur, sequela: Secondary | ICD-10-CM | POA: Diagnosis not present

## 2022-10-13 DIAGNOSIS — R41841 Cognitive communication deficit: Secondary | ICD-10-CM | POA: Diagnosis not present

## 2022-10-13 DIAGNOSIS — R1310 Dysphagia, unspecified: Secondary | ICD-10-CM | POA: Diagnosis not present

## 2022-10-16 DIAGNOSIS — R1310 Dysphagia, unspecified: Secondary | ICD-10-CM | POA: Diagnosis not present

## 2022-10-16 DIAGNOSIS — J449 Chronic obstructive pulmonary disease, unspecified: Secondary | ICD-10-CM | POA: Diagnosis not present

## 2022-10-16 DIAGNOSIS — S7291XS Unspecified fracture of right femur, sequela: Secondary | ICD-10-CM | POA: Diagnosis not present

## 2022-10-16 DIAGNOSIS — U071 COVID-19: Secondary | ICD-10-CM | POA: Diagnosis not present

## 2022-10-16 DIAGNOSIS — N182 Chronic kidney disease, stage 2 (mild): Secondary | ICD-10-CM | POA: Diagnosis not present

## 2022-10-16 DIAGNOSIS — R41841 Cognitive communication deficit: Secondary | ICD-10-CM | POA: Diagnosis not present

## 2022-10-18 DIAGNOSIS — S7291XS Unspecified fracture of right femur, sequela: Secondary | ICD-10-CM | POA: Diagnosis not present

## 2022-10-18 DIAGNOSIS — R41841 Cognitive communication deficit: Secondary | ICD-10-CM | POA: Diagnosis not present

## 2022-10-18 DIAGNOSIS — R1310 Dysphagia, unspecified: Secondary | ICD-10-CM | POA: Diagnosis not present

## 2022-10-19 DIAGNOSIS — R41841 Cognitive communication deficit: Secondary | ICD-10-CM | POA: Diagnosis not present

## 2022-10-19 DIAGNOSIS — R1310 Dysphagia, unspecified: Secondary | ICD-10-CM | POA: Diagnosis not present

## 2022-10-19 DIAGNOSIS — S7291XS Unspecified fracture of right femur, sequela: Secondary | ICD-10-CM | POA: Diagnosis not present

## 2022-10-20 DIAGNOSIS — S7291XS Unspecified fracture of right femur, sequela: Secondary | ICD-10-CM | POA: Diagnosis not present

## 2022-10-20 DIAGNOSIS — R1310 Dysphagia, unspecified: Secondary | ICD-10-CM | POA: Diagnosis not present

## 2022-10-20 DIAGNOSIS — R41841 Cognitive communication deficit: Secondary | ICD-10-CM | POA: Diagnosis not present

## 2022-10-25 DIAGNOSIS — J449 Chronic obstructive pulmonary disease, unspecified: Secondary | ICD-10-CM | POA: Diagnosis not present

## 2022-10-25 DIAGNOSIS — F0390 Unspecified dementia without behavioral disturbance: Secondary | ICD-10-CM | POA: Diagnosis not present

## 2022-10-25 DIAGNOSIS — R262 Difficulty in walking, not elsewhere classified: Secondary | ICD-10-CM | POA: Diagnosis not present

## 2022-10-25 DIAGNOSIS — R1310 Dysphagia, unspecified: Secondary | ICD-10-CM | POA: Diagnosis not present

## 2022-10-25 DIAGNOSIS — Z8719 Personal history of other diseases of the digestive system: Secondary | ICD-10-CM | POA: Diagnosis not present

## 2022-10-25 DIAGNOSIS — Z0189 Encounter for other specified special examinations: Secondary | ICD-10-CM | POA: Diagnosis not present

## 2022-10-25 DIAGNOSIS — Z79899 Other long term (current) drug therapy: Secondary | ICD-10-CM | POA: Diagnosis not present

## 2022-10-25 DIAGNOSIS — Z8616 Personal history of COVID-19: Secondary | ICD-10-CM | POA: Diagnosis not present

## 2022-11-27 DIAGNOSIS — F03B18 Unspecified dementia, moderate, with other behavioral disturbance: Secondary | ICD-10-CM | POA: Diagnosis not present

## 2023-01-08 DIAGNOSIS — F0280 Dementia in other diseases classified elsewhere without behavioral disturbance: Secondary | ICD-10-CM | POA: Diagnosis not present

## 2023-01-08 DIAGNOSIS — R41841 Cognitive communication deficit: Secondary | ICD-10-CM | POA: Diagnosis not present

## 2023-01-08 DIAGNOSIS — S7291XS Unspecified fracture of right femur, sequela: Secondary | ICD-10-CM | POA: Diagnosis not present

## 2023-01-08 DIAGNOSIS — R1310 Dysphagia, unspecified: Secondary | ICD-10-CM | POA: Diagnosis not present

## 2023-01-09 DIAGNOSIS — R41841 Cognitive communication deficit: Secondary | ICD-10-CM | POA: Diagnosis not present

## 2023-01-09 DIAGNOSIS — S7291XS Unspecified fracture of right femur, sequela: Secondary | ICD-10-CM | POA: Diagnosis not present

## 2023-01-09 DIAGNOSIS — R1310 Dysphagia, unspecified: Secondary | ICD-10-CM | POA: Diagnosis not present

## 2023-01-09 DIAGNOSIS — F0280 Dementia in other diseases classified elsewhere without behavioral disturbance: Secondary | ICD-10-CM | POA: Diagnosis not present

## 2023-01-10 DIAGNOSIS — S7291XS Unspecified fracture of right femur, sequela: Secondary | ICD-10-CM | POA: Diagnosis not present

## 2023-01-10 DIAGNOSIS — R41841 Cognitive communication deficit: Secondary | ICD-10-CM | POA: Diagnosis not present

## 2023-01-10 DIAGNOSIS — R1310 Dysphagia, unspecified: Secondary | ICD-10-CM | POA: Diagnosis not present

## 2023-01-10 DIAGNOSIS — F0280 Dementia in other diseases classified elsewhere without behavioral disturbance: Secondary | ICD-10-CM | POA: Diagnosis not present

## 2023-01-11 DIAGNOSIS — S7291XS Unspecified fracture of right femur, sequela: Secondary | ICD-10-CM | POA: Diagnosis not present

## 2023-01-11 DIAGNOSIS — R41841 Cognitive communication deficit: Secondary | ICD-10-CM | POA: Diagnosis not present

## 2023-01-11 DIAGNOSIS — R1310 Dysphagia, unspecified: Secondary | ICD-10-CM | POA: Diagnosis not present

## 2023-01-11 DIAGNOSIS — F0280 Dementia in other diseases classified elsewhere without behavioral disturbance: Secondary | ICD-10-CM | POA: Diagnosis not present

## 2023-01-12 DIAGNOSIS — Z7982 Long term (current) use of aspirin: Secondary | ICD-10-CM | POA: Diagnosis not present

## 2023-01-12 DIAGNOSIS — R41841 Cognitive communication deficit: Secondary | ICD-10-CM | POA: Diagnosis not present

## 2023-01-12 DIAGNOSIS — E559 Vitamin D deficiency, unspecified: Secondary | ICD-10-CM | POA: Diagnosis not present

## 2023-01-12 DIAGNOSIS — E441 Mild protein-calorie malnutrition: Secondary | ICD-10-CM | POA: Diagnosis not present

## 2023-01-12 DIAGNOSIS — Z8616 Personal history of COVID-19: Secondary | ICD-10-CM | POA: Diagnosis not present

## 2023-01-12 DIAGNOSIS — J449 Chronic obstructive pulmonary disease, unspecified: Secondary | ICD-10-CM | POA: Diagnosis not present

## 2023-01-12 DIAGNOSIS — E785 Hyperlipidemia, unspecified: Secondary | ICD-10-CM | POA: Diagnosis not present

## 2023-01-12 DIAGNOSIS — K5900 Constipation, unspecified: Secondary | ICD-10-CM | POA: Diagnosis not present

## 2023-01-12 DIAGNOSIS — F0280 Dementia in other diseases classified elsewhere without behavioral disturbance: Secondary | ICD-10-CM | POA: Diagnosis not present

## 2023-01-12 DIAGNOSIS — R1310 Dysphagia, unspecified: Secondary | ICD-10-CM | POA: Diagnosis not present

## 2023-01-12 DIAGNOSIS — S7291XS Unspecified fracture of right femur, sequela: Secondary | ICD-10-CM | POA: Diagnosis not present

## 2023-01-12 DIAGNOSIS — N182 Chronic kidney disease, stage 2 (mild): Secondary | ICD-10-CM | POA: Diagnosis not present

## 2023-01-12 DIAGNOSIS — F03C0 Unspecified dementia, severe, without behavioral disturbance, psychotic disturbance, mood disturbance, and anxiety: Secondary | ICD-10-CM | POA: Diagnosis not present

## 2023-01-12 DIAGNOSIS — Z79899 Other long term (current) drug therapy: Secondary | ICD-10-CM | POA: Diagnosis not present

## 2023-01-16 DIAGNOSIS — S7291XS Unspecified fracture of right femur, sequela: Secondary | ICD-10-CM | POA: Diagnosis not present

## 2023-01-16 DIAGNOSIS — R41841 Cognitive communication deficit: Secondary | ICD-10-CM | POA: Diagnosis not present

## 2023-01-16 DIAGNOSIS — F0280 Dementia in other diseases classified elsewhere without behavioral disturbance: Secondary | ICD-10-CM | POA: Diagnosis not present

## 2023-01-16 DIAGNOSIS — R1310 Dysphagia, unspecified: Secondary | ICD-10-CM | POA: Diagnosis not present

## 2023-01-17 DIAGNOSIS — R1310 Dysphagia, unspecified: Secondary | ICD-10-CM | POA: Diagnosis not present

## 2023-01-17 DIAGNOSIS — F0280 Dementia in other diseases classified elsewhere without behavioral disturbance: Secondary | ICD-10-CM | POA: Diagnosis not present

## 2023-01-17 DIAGNOSIS — S7291XS Unspecified fracture of right femur, sequela: Secondary | ICD-10-CM | POA: Diagnosis not present

## 2023-01-17 DIAGNOSIS — R41841 Cognitive communication deficit: Secondary | ICD-10-CM | POA: Diagnosis not present

## 2023-01-19 DIAGNOSIS — R41841 Cognitive communication deficit: Secondary | ICD-10-CM | POA: Diagnosis not present

## 2023-01-19 DIAGNOSIS — R1310 Dysphagia, unspecified: Secondary | ICD-10-CM | POA: Diagnosis not present

## 2023-01-19 DIAGNOSIS — F0280 Dementia in other diseases classified elsewhere without behavioral disturbance: Secondary | ICD-10-CM | POA: Diagnosis not present

## 2023-01-19 DIAGNOSIS — S7291XS Unspecified fracture of right femur, sequela: Secondary | ICD-10-CM | POA: Diagnosis not present

## 2023-01-22 DIAGNOSIS — R41841 Cognitive communication deficit: Secondary | ICD-10-CM | POA: Diagnosis not present

## 2023-01-22 DIAGNOSIS — F0280 Dementia in other diseases classified elsewhere without behavioral disturbance: Secondary | ICD-10-CM | POA: Diagnosis not present

## 2023-01-22 DIAGNOSIS — S7291XS Unspecified fracture of right femur, sequela: Secondary | ICD-10-CM | POA: Diagnosis not present

## 2023-01-22 DIAGNOSIS — R1310 Dysphagia, unspecified: Secondary | ICD-10-CM | POA: Diagnosis not present

## 2023-01-23 DIAGNOSIS — F0280 Dementia in other diseases classified elsewhere without behavioral disturbance: Secondary | ICD-10-CM | POA: Diagnosis not present

## 2023-01-23 DIAGNOSIS — S7291XS Unspecified fracture of right femur, sequela: Secondary | ICD-10-CM | POA: Diagnosis not present

## 2023-01-23 DIAGNOSIS — R41841 Cognitive communication deficit: Secondary | ICD-10-CM | POA: Diagnosis not present

## 2023-01-23 DIAGNOSIS — R1310 Dysphagia, unspecified: Secondary | ICD-10-CM | POA: Diagnosis not present

## 2023-01-25 DIAGNOSIS — S7291XS Unspecified fracture of right femur, sequela: Secondary | ICD-10-CM | POA: Diagnosis not present

## 2023-01-25 DIAGNOSIS — R41841 Cognitive communication deficit: Secondary | ICD-10-CM | POA: Diagnosis not present

## 2023-01-25 DIAGNOSIS — R1310 Dysphagia, unspecified: Secondary | ICD-10-CM | POA: Diagnosis not present

## 2023-01-25 DIAGNOSIS — F0280 Dementia in other diseases classified elsewhere without behavioral disturbance: Secondary | ICD-10-CM | POA: Diagnosis not present

## 2023-01-26 DIAGNOSIS — S7291XS Unspecified fracture of right femur, sequela: Secondary | ICD-10-CM | POA: Diagnosis not present

## 2023-01-26 DIAGNOSIS — D649 Anemia, unspecified: Secondary | ICD-10-CM | POA: Diagnosis not present

## 2023-01-26 DIAGNOSIS — R1310 Dysphagia, unspecified: Secondary | ICD-10-CM | POA: Diagnosis not present

## 2023-01-26 DIAGNOSIS — R41841 Cognitive communication deficit: Secondary | ICD-10-CM | POA: Diagnosis not present

## 2023-01-26 DIAGNOSIS — F0280 Dementia in other diseases classified elsewhere without behavioral disturbance: Secondary | ICD-10-CM | POA: Diagnosis not present

## 2023-01-29 DIAGNOSIS — R41841 Cognitive communication deficit: Secondary | ICD-10-CM | POA: Diagnosis not present

## 2023-01-29 DIAGNOSIS — S7291XS Unspecified fracture of right femur, sequela: Secondary | ICD-10-CM | POA: Diagnosis not present

## 2023-01-29 DIAGNOSIS — F0280 Dementia in other diseases classified elsewhere without behavioral disturbance: Secondary | ICD-10-CM | POA: Diagnosis not present

## 2023-01-29 DIAGNOSIS — R1310 Dysphagia, unspecified: Secondary | ICD-10-CM | POA: Diagnosis not present

## 2023-01-31 DIAGNOSIS — R1310 Dysphagia, unspecified: Secondary | ICD-10-CM | POA: Diagnosis not present

## 2023-01-31 DIAGNOSIS — R41841 Cognitive communication deficit: Secondary | ICD-10-CM | POA: Diagnosis not present

## 2023-01-31 DIAGNOSIS — F0280 Dementia in other diseases classified elsewhere without behavioral disturbance: Secondary | ICD-10-CM | POA: Diagnosis not present

## 2023-01-31 DIAGNOSIS — S7291XS Unspecified fracture of right femur, sequela: Secondary | ICD-10-CM | POA: Diagnosis not present

## 2023-02-01 DIAGNOSIS — F0280 Dementia in other diseases classified elsewhere without behavioral disturbance: Secondary | ICD-10-CM | POA: Diagnosis not present

## 2023-02-01 DIAGNOSIS — R1310 Dysphagia, unspecified: Secondary | ICD-10-CM | POA: Diagnosis not present

## 2023-02-01 DIAGNOSIS — S7291XS Unspecified fracture of right femur, sequela: Secondary | ICD-10-CM | POA: Diagnosis not present

## 2023-02-01 DIAGNOSIS — R41841 Cognitive communication deficit: Secondary | ICD-10-CM | POA: Diagnosis not present

## 2023-02-02 DIAGNOSIS — S7291XS Unspecified fracture of right femur, sequela: Secondary | ICD-10-CM | POA: Diagnosis not present

## 2023-02-02 DIAGNOSIS — R41841 Cognitive communication deficit: Secondary | ICD-10-CM | POA: Diagnosis not present

## 2023-02-02 DIAGNOSIS — R1310 Dysphagia, unspecified: Secondary | ICD-10-CM | POA: Diagnosis not present

## 2023-02-02 DIAGNOSIS — F0280 Dementia in other diseases classified elsewhere without behavioral disturbance: Secondary | ICD-10-CM | POA: Diagnosis not present

## 2023-02-06 DIAGNOSIS — S7291XS Unspecified fracture of right femur, sequela: Secondary | ICD-10-CM | POA: Diagnosis not present

## 2023-02-06 DIAGNOSIS — F0280 Dementia in other diseases classified elsewhere without behavioral disturbance: Secondary | ICD-10-CM | POA: Diagnosis not present

## 2023-02-06 DIAGNOSIS — R2689 Other abnormalities of gait and mobility: Secondary | ICD-10-CM | POA: Diagnosis not present

## 2023-02-06 DIAGNOSIS — R1310 Dysphagia, unspecified: Secondary | ICD-10-CM | POA: Diagnosis not present

## 2023-02-06 DIAGNOSIS — R41841 Cognitive communication deficit: Secondary | ICD-10-CM | POA: Diagnosis not present

## 2023-02-06 DIAGNOSIS — J449 Chronic obstructive pulmonary disease, unspecified: Secondary | ICD-10-CM | POA: Diagnosis not present

## 2023-02-07 DIAGNOSIS — J449 Chronic obstructive pulmonary disease, unspecified: Secondary | ICD-10-CM | POA: Diagnosis not present

## 2023-02-07 DIAGNOSIS — R41841 Cognitive communication deficit: Secondary | ICD-10-CM | POA: Diagnosis not present

## 2023-02-07 DIAGNOSIS — R1310 Dysphagia, unspecified: Secondary | ICD-10-CM | POA: Diagnosis not present

## 2023-02-07 DIAGNOSIS — S7291XS Unspecified fracture of right femur, sequela: Secondary | ICD-10-CM | POA: Diagnosis not present

## 2023-02-07 DIAGNOSIS — R2689 Other abnormalities of gait and mobility: Secondary | ICD-10-CM | POA: Diagnosis not present

## 2023-02-07 DIAGNOSIS — F0280 Dementia in other diseases classified elsewhere without behavioral disturbance: Secondary | ICD-10-CM | POA: Diagnosis not present

## 2023-02-09 DIAGNOSIS — R1310 Dysphagia, unspecified: Secondary | ICD-10-CM | POA: Diagnosis not present

## 2023-02-09 DIAGNOSIS — R41841 Cognitive communication deficit: Secondary | ICD-10-CM | POA: Diagnosis not present

## 2023-02-09 DIAGNOSIS — J449 Chronic obstructive pulmonary disease, unspecified: Secondary | ICD-10-CM | POA: Diagnosis not present

## 2023-02-09 DIAGNOSIS — R2689 Other abnormalities of gait and mobility: Secondary | ICD-10-CM | POA: Diagnosis not present

## 2023-02-09 DIAGNOSIS — F0280 Dementia in other diseases classified elsewhere without behavioral disturbance: Secondary | ICD-10-CM | POA: Diagnosis not present

## 2023-02-09 DIAGNOSIS — S7291XS Unspecified fracture of right femur, sequela: Secondary | ICD-10-CM | POA: Diagnosis not present

## 2023-02-12 DIAGNOSIS — R41841 Cognitive communication deficit: Secondary | ICD-10-CM | POA: Diagnosis not present

## 2023-02-12 DIAGNOSIS — J449 Chronic obstructive pulmonary disease, unspecified: Secondary | ICD-10-CM | POA: Diagnosis not present

## 2023-02-12 DIAGNOSIS — F0280 Dementia in other diseases classified elsewhere without behavioral disturbance: Secondary | ICD-10-CM | POA: Diagnosis not present

## 2023-02-12 DIAGNOSIS — R1310 Dysphagia, unspecified: Secondary | ICD-10-CM | POA: Diagnosis not present

## 2023-02-12 DIAGNOSIS — R2689 Other abnormalities of gait and mobility: Secondary | ICD-10-CM | POA: Diagnosis not present

## 2023-02-12 DIAGNOSIS — S7291XS Unspecified fracture of right femur, sequela: Secondary | ICD-10-CM | POA: Diagnosis not present

## 2023-02-14 DIAGNOSIS — R2689 Other abnormalities of gait and mobility: Secondary | ICD-10-CM | POA: Diagnosis not present

## 2023-02-14 DIAGNOSIS — J449 Chronic obstructive pulmonary disease, unspecified: Secondary | ICD-10-CM | POA: Diagnosis not present

## 2023-02-14 DIAGNOSIS — S7291XS Unspecified fracture of right femur, sequela: Secondary | ICD-10-CM | POA: Diagnosis not present

## 2023-02-14 DIAGNOSIS — R1310 Dysphagia, unspecified: Secondary | ICD-10-CM | POA: Diagnosis not present

## 2023-02-14 DIAGNOSIS — R41841 Cognitive communication deficit: Secondary | ICD-10-CM | POA: Diagnosis not present

## 2023-02-14 DIAGNOSIS — F0280 Dementia in other diseases classified elsewhere without behavioral disturbance: Secondary | ICD-10-CM | POA: Diagnosis not present

## 2023-02-16 DIAGNOSIS — J449 Chronic obstructive pulmonary disease, unspecified: Secondary | ICD-10-CM | POA: Diagnosis not present

## 2023-02-16 DIAGNOSIS — R41841 Cognitive communication deficit: Secondary | ICD-10-CM | POA: Diagnosis not present

## 2023-02-16 DIAGNOSIS — F0280 Dementia in other diseases classified elsewhere without behavioral disturbance: Secondary | ICD-10-CM | POA: Diagnosis not present

## 2023-02-16 DIAGNOSIS — R1310 Dysphagia, unspecified: Secondary | ICD-10-CM | POA: Diagnosis not present

## 2023-02-16 DIAGNOSIS — S7291XS Unspecified fracture of right femur, sequela: Secondary | ICD-10-CM | POA: Diagnosis not present

## 2023-02-16 DIAGNOSIS — R2689 Other abnormalities of gait and mobility: Secondary | ICD-10-CM | POA: Diagnosis not present

## 2023-02-19 DIAGNOSIS — I739 Peripheral vascular disease, unspecified: Secondary | ICD-10-CM | POA: Diagnosis not present

## 2023-02-19 DIAGNOSIS — F0280 Dementia in other diseases classified elsewhere without behavioral disturbance: Secondary | ICD-10-CM | POA: Diagnosis not present

## 2023-02-19 DIAGNOSIS — R1310 Dysphagia, unspecified: Secondary | ICD-10-CM | POA: Diagnosis not present

## 2023-02-19 DIAGNOSIS — L602 Onychogryphosis: Secondary | ICD-10-CM | POA: Diagnosis not present

## 2023-02-19 DIAGNOSIS — S7291XS Unspecified fracture of right femur, sequela: Secondary | ICD-10-CM | POA: Diagnosis not present

## 2023-02-19 DIAGNOSIS — Z23 Encounter for immunization: Secondary | ICD-10-CM | POA: Diagnosis not present

## 2023-02-19 DIAGNOSIS — R41841 Cognitive communication deficit: Secondary | ICD-10-CM | POA: Diagnosis not present

## 2023-02-19 DIAGNOSIS — R2689 Other abnormalities of gait and mobility: Secondary | ICD-10-CM | POA: Diagnosis not present

## 2023-02-19 DIAGNOSIS — L603 Nail dystrophy: Secondary | ICD-10-CM | POA: Diagnosis not present

## 2023-02-19 DIAGNOSIS — J449 Chronic obstructive pulmonary disease, unspecified: Secondary | ICD-10-CM | POA: Diagnosis not present

## 2023-02-21 DIAGNOSIS — R2689 Other abnormalities of gait and mobility: Secondary | ICD-10-CM | POA: Diagnosis not present

## 2023-02-21 DIAGNOSIS — R41841 Cognitive communication deficit: Secondary | ICD-10-CM | POA: Diagnosis not present

## 2023-02-21 DIAGNOSIS — R1310 Dysphagia, unspecified: Secondary | ICD-10-CM | POA: Diagnosis not present

## 2023-02-21 DIAGNOSIS — F0280 Dementia in other diseases classified elsewhere without behavioral disturbance: Secondary | ICD-10-CM | POA: Diagnosis not present

## 2023-02-21 DIAGNOSIS — S7291XS Unspecified fracture of right femur, sequela: Secondary | ICD-10-CM | POA: Diagnosis not present

## 2023-02-21 DIAGNOSIS — J449 Chronic obstructive pulmonary disease, unspecified: Secondary | ICD-10-CM | POA: Diagnosis not present

## 2023-02-22 DIAGNOSIS — J449 Chronic obstructive pulmonary disease, unspecified: Secondary | ICD-10-CM | POA: Diagnosis not present

## 2023-02-22 DIAGNOSIS — E785 Hyperlipidemia, unspecified: Secondary | ICD-10-CM | POA: Diagnosis not present

## 2023-02-22 DIAGNOSIS — E441 Mild protein-calorie malnutrition: Secondary | ICD-10-CM | POA: Diagnosis not present

## 2023-02-22 DIAGNOSIS — K5900 Constipation, unspecified: Secondary | ICD-10-CM | POA: Diagnosis not present

## 2023-02-22 DIAGNOSIS — Z6824 Body mass index (BMI) 24.0-24.9, adult: Secondary | ICD-10-CM | POA: Diagnosis not present

## 2023-02-22 DIAGNOSIS — F03C0 Unspecified dementia, severe, without behavioral disturbance, psychotic disturbance, mood disturbance, and anxiety: Secondary | ICD-10-CM | POA: Diagnosis not present

## 2023-02-22 DIAGNOSIS — I700 Atherosclerosis of aorta: Secondary | ICD-10-CM | POA: Diagnosis not present

## 2023-02-22 DIAGNOSIS — N182 Chronic kidney disease, stage 2 (mild): Secondary | ICD-10-CM | POA: Diagnosis not present

## 2023-02-23 DIAGNOSIS — R2689 Other abnormalities of gait and mobility: Secondary | ICD-10-CM | POA: Diagnosis not present

## 2023-02-23 DIAGNOSIS — R41841 Cognitive communication deficit: Secondary | ICD-10-CM | POA: Diagnosis not present

## 2023-02-23 DIAGNOSIS — R1310 Dysphagia, unspecified: Secondary | ICD-10-CM | POA: Diagnosis not present

## 2023-02-23 DIAGNOSIS — J449 Chronic obstructive pulmonary disease, unspecified: Secondary | ICD-10-CM | POA: Diagnosis not present

## 2023-02-23 DIAGNOSIS — S7291XS Unspecified fracture of right femur, sequela: Secondary | ICD-10-CM | POA: Diagnosis not present

## 2023-02-23 DIAGNOSIS — F0280 Dementia in other diseases classified elsewhere without behavioral disturbance: Secondary | ICD-10-CM | POA: Diagnosis not present

## 2023-02-27 DIAGNOSIS — R2689 Other abnormalities of gait and mobility: Secondary | ICD-10-CM | POA: Diagnosis not present

## 2023-02-27 DIAGNOSIS — R41841 Cognitive communication deficit: Secondary | ICD-10-CM | POA: Diagnosis not present

## 2023-02-27 DIAGNOSIS — R1310 Dysphagia, unspecified: Secondary | ICD-10-CM | POA: Diagnosis not present

## 2023-02-27 DIAGNOSIS — J449 Chronic obstructive pulmonary disease, unspecified: Secondary | ICD-10-CM | POA: Diagnosis not present

## 2023-02-27 DIAGNOSIS — S7291XS Unspecified fracture of right femur, sequela: Secondary | ICD-10-CM | POA: Diagnosis not present

## 2023-02-27 DIAGNOSIS — F0280 Dementia in other diseases classified elsewhere without behavioral disturbance: Secondary | ICD-10-CM | POA: Diagnosis not present

## 2023-03-06 DIAGNOSIS — R2689 Other abnormalities of gait and mobility: Secondary | ICD-10-CM | POA: Diagnosis not present

## 2023-03-06 DIAGNOSIS — J449 Chronic obstructive pulmonary disease, unspecified: Secondary | ICD-10-CM | POA: Diagnosis not present

## 2023-03-07 DIAGNOSIS — R2689 Other abnormalities of gait and mobility: Secondary | ICD-10-CM | POA: Diagnosis not present

## 2023-03-07 DIAGNOSIS — J449 Chronic obstructive pulmonary disease, unspecified: Secondary | ICD-10-CM | POA: Diagnosis not present

## 2023-03-08 DIAGNOSIS — Z7185 Encounter for immunization safety counseling: Secondary | ICD-10-CM | POA: Diagnosis not present

## 2023-03-08 DIAGNOSIS — Z23 Encounter for immunization: Secondary | ICD-10-CM | POA: Diagnosis not present

## 2023-03-08 DIAGNOSIS — J449 Chronic obstructive pulmonary disease, unspecified: Secondary | ICD-10-CM | POA: Diagnosis not present

## 2023-03-08 DIAGNOSIS — R2689 Other abnormalities of gait and mobility: Secondary | ICD-10-CM | POA: Diagnosis not present

## 2023-03-12 DIAGNOSIS — J449 Chronic obstructive pulmonary disease, unspecified: Secondary | ICD-10-CM | POA: Diagnosis not present

## 2023-03-12 DIAGNOSIS — F03B18 Unspecified dementia, moderate, with other behavioral disturbance: Secondary | ICD-10-CM | POA: Diagnosis not present

## 2023-03-12 DIAGNOSIS — R2689 Other abnormalities of gait and mobility: Secondary | ICD-10-CM | POA: Diagnosis not present

## 2023-03-14 DIAGNOSIS — J449 Chronic obstructive pulmonary disease, unspecified: Secondary | ICD-10-CM | POA: Diagnosis not present

## 2023-03-14 DIAGNOSIS — R2689 Other abnormalities of gait and mobility: Secondary | ICD-10-CM | POA: Diagnosis not present

## 2023-03-15 DIAGNOSIS — R2689 Other abnormalities of gait and mobility: Secondary | ICD-10-CM | POA: Diagnosis not present

## 2023-03-15 DIAGNOSIS — J449 Chronic obstructive pulmonary disease, unspecified: Secondary | ICD-10-CM | POA: Diagnosis not present

## 2023-03-19 DIAGNOSIS — J449 Chronic obstructive pulmonary disease, unspecified: Secondary | ICD-10-CM | POA: Diagnosis not present

## 2023-03-19 DIAGNOSIS — R2689 Other abnormalities of gait and mobility: Secondary | ICD-10-CM | POA: Diagnosis not present

## 2023-03-21 DIAGNOSIS — J449 Chronic obstructive pulmonary disease, unspecified: Secondary | ICD-10-CM | POA: Diagnosis not present

## 2023-03-21 DIAGNOSIS — R2689 Other abnormalities of gait and mobility: Secondary | ICD-10-CM | POA: Diagnosis not present

## 2023-03-23 DIAGNOSIS — J449 Chronic obstructive pulmonary disease, unspecified: Secondary | ICD-10-CM | POA: Diagnosis not present

## 2023-03-23 DIAGNOSIS — R2689 Other abnormalities of gait and mobility: Secondary | ICD-10-CM | POA: Diagnosis not present

## 2023-03-26 DIAGNOSIS — F03B18 Unspecified dementia, moderate, with other behavioral disturbance: Secondary | ICD-10-CM | POA: Diagnosis not present

## 2023-04-06 DIAGNOSIS — M6281 Muscle weakness (generalized): Secondary | ICD-10-CM | POA: Diagnosis not present

## 2023-04-09 DIAGNOSIS — M6281 Muscle weakness (generalized): Secondary | ICD-10-CM | POA: Diagnosis not present

## 2023-04-10 DIAGNOSIS — M6281 Muscle weakness (generalized): Secondary | ICD-10-CM | POA: Diagnosis not present

## 2023-04-11 DIAGNOSIS — M6281 Muscle weakness (generalized): Secondary | ICD-10-CM | POA: Diagnosis not present

## 2023-04-12 DIAGNOSIS — M6281 Muscle weakness (generalized): Secondary | ICD-10-CM | POA: Diagnosis not present

## 2023-04-13 DIAGNOSIS — M6281 Muscle weakness (generalized): Secondary | ICD-10-CM | POA: Diagnosis not present

## 2023-04-16 DIAGNOSIS — M6281 Muscle weakness (generalized): Secondary | ICD-10-CM | POA: Diagnosis not present

## 2023-04-19 DIAGNOSIS — M6281 Muscle weakness (generalized): Secondary | ICD-10-CM | POA: Diagnosis not present

## 2023-04-20 DIAGNOSIS — M6281 Muscle weakness (generalized): Secondary | ICD-10-CM | POA: Diagnosis not present

## 2023-04-23 DIAGNOSIS — M6281 Muscle weakness (generalized): Secondary | ICD-10-CM | POA: Diagnosis not present

## 2023-04-25 DIAGNOSIS — M6281 Muscle weakness (generalized): Secondary | ICD-10-CM | POA: Diagnosis not present

## 2023-04-27 DIAGNOSIS — M6281 Muscle weakness (generalized): Secondary | ICD-10-CM | POA: Diagnosis not present

## 2023-05-09 DIAGNOSIS — K5900 Constipation, unspecified: Secondary | ICD-10-CM | POA: Diagnosis not present

## 2023-05-09 DIAGNOSIS — S0083XD Contusion of other part of head, subsequent encounter: Secondary | ICD-10-CM | POA: Diagnosis not present

## 2023-05-09 DIAGNOSIS — N182 Chronic kidney disease, stage 2 (mild): Secondary | ICD-10-CM | POA: Diagnosis not present

## 2023-05-09 DIAGNOSIS — J449 Chronic obstructive pulmonary disease, unspecified: Secondary | ICD-10-CM | POA: Diagnosis not present

## 2023-05-09 DIAGNOSIS — I700 Atherosclerosis of aorta: Secondary | ICD-10-CM | POA: Diagnosis not present

## 2023-05-09 DIAGNOSIS — Z7982 Long term (current) use of aspirin: Secondary | ICD-10-CM | POA: Diagnosis not present

## 2023-05-09 DIAGNOSIS — R1310 Dysphagia, unspecified: Secondary | ICD-10-CM | POA: Diagnosis not present

## 2023-05-09 DIAGNOSIS — F03C0 Unspecified dementia, severe, without behavioral disturbance, psychotic disturbance, mood disturbance, and anxiety: Secondary | ICD-10-CM | POA: Diagnosis not present

## 2023-05-16 DIAGNOSIS — Z7982 Long term (current) use of aspirin: Secondary | ICD-10-CM | POA: Diagnosis not present

## 2023-05-16 DIAGNOSIS — I700 Atherosclerosis of aorta: Secondary | ICD-10-CM | POA: Diagnosis not present

## 2023-05-16 DIAGNOSIS — S0083XD Contusion of other part of head, subsequent encounter: Secondary | ICD-10-CM | POA: Diagnosis not present

## 2023-06-28 DIAGNOSIS — N182 Chronic kidney disease, stage 2 (mild): Secondary | ICD-10-CM | POA: Diagnosis not present

## 2023-06-28 DIAGNOSIS — I700 Atherosclerosis of aorta: Secondary | ICD-10-CM | POA: Diagnosis not present

## 2023-06-28 DIAGNOSIS — J449 Chronic obstructive pulmonary disease, unspecified: Secondary | ICD-10-CM | POA: Diagnosis not present

## 2023-06-28 DIAGNOSIS — F03C0 Unspecified dementia, severe, without behavioral disturbance, psychotic disturbance, mood disturbance, and anxiety: Secondary | ICD-10-CM | POA: Diagnosis not present

## 2023-06-28 DIAGNOSIS — E441 Mild protein-calorie malnutrition: Secondary | ICD-10-CM | POA: Diagnosis not present

## 2023-06-28 DIAGNOSIS — E785 Hyperlipidemia, unspecified: Secondary | ICD-10-CM | POA: Diagnosis not present

## 2023-07-05 DIAGNOSIS — R2689 Other abnormalities of gait and mobility: Secondary | ICD-10-CM | POA: Diagnosis not present

## 2023-07-05 DIAGNOSIS — S7291XS Unspecified fracture of right femur, sequela: Secondary | ICD-10-CM | POA: Diagnosis not present

## 2023-07-05 DIAGNOSIS — F0280 Dementia in other diseases classified elsewhere without behavioral disturbance: Secondary | ICD-10-CM | POA: Diagnosis not present

## 2023-07-06 DIAGNOSIS — F0280 Dementia in other diseases classified elsewhere without behavioral disturbance: Secondary | ICD-10-CM | POA: Diagnosis not present

## 2023-07-06 DIAGNOSIS — S7291XS Unspecified fracture of right femur, sequela: Secondary | ICD-10-CM | POA: Diagnosis not present

## 2023-07-06 DIAGNOSIS — R2689 Other abnormalities of gait and mobility: Secondary | ICD-10-CM | POA: Diagnosis not present

## 2023-07-07 DIAGNOSIS — S7291XS Unspecified fracture of right femur, sequela: Secondary | ICD-10-CM | POA: Diagnosis not present

## 2023-07-07 DIAGNOSIS — F0280 Dementia in other diseases classified elsewhere without behavioral disturbance: Secondary | ICD-10-CM | POA: Diagnosis not present

## 2023-07-07 DIAGNOSIS — R2689 Other abnormalities of gait and mobility: Secondary | ICD-10-CM | POA: Diagnosis not present

## 2023-07-09 DIAGNOSIS — S7291XS Unspecified fracture of right femur, sequela: Secondary | ICD-10-CM | POA: Diagnosis not present

## 2023-07-09 DIAGNOSIS — R2689 Other abnormalities of gait and mobility: Secondary | ICD-10-CM | POA: Diagnosis not present

## 2023-07-09 DIAGNOSIS — F0280 Dementia in other diseases classified elsewhere without behavioral disturbance: Secondary | ICD-10-CM | POA: Diagnosis not present

## 2023-07-10 DIAGNOSIS — R2689 Other abnormalities of gait and mobility: Secondary | ICD-10-CM | POA: Diagnosis not present

## 2023-07-10 DIAGNOSIS — S7291XS Unspecified fracture of right femur, sequela: Secondary | ICD-10-CM | POA: Diagnosis not present

## 2023-07-10 DIAGNOSIS — F0280 Dementia in other diseases classified elsewhere without behavioral disturbance: Secondary | ICD-10-CM | POA: Diagnosis not present

## 2023-07-10 DIAGNOSIS — R509 Fever, unspecified: Secondary | ICD-10-CM | POA: Diagnosis not present

## 2023-07-13 DIAGNOSIS — J449 Chronic obstructive pulmonary disease, unspecified: Secondary | ICD-10-CM | POA: Diagnosis not present

## 2023-07-13 DIAGNOSIS — Z7189 Other specified counseling: Secondary | ICD-10-CM | POA: Diagnosis not present

## 2023-07-13 DIAGNOSIS — B338 Other specified viral diseases: Secondary | ICD-10-CM | POA: Diagnosis not present

## 2023-07-13 DIAGNOSIS — F03C0 Unspecified dementia, severe, without behavioral disturbance, psychotic disturbance, mood disturbance, and anxiety: Secondary | ICD-10-CM | POA: Diagnosis not present

## 2023-07-13 DIAGNOSIS — Z1331 Encounter for screening for depression: Secondary | ICD-10-CM | POA: Diagnosis not present

## 2023-07-16 DIAGNOSIS — F0280 Dementia in other diseases classified elsewhere without behavioral disturbance: Secondary | ICD-10-CM | POA: Diagnosis not present

## 2023-07-16 DIAGNOSIS — S7291XS Unspecified fracture of right femur, sequela: Secondary | ICD-10-CM | POA: Diagnosis not present

## 2023-07-16 DIAGNOSIS — R2689 Other abnormalities of gait and mobility: Secondary | ICD-10-CM | POA: Diagnosis not present

## 2023-07-17 DIAGNOSIS — R2689 Other abnormalities of gait and mobility: Secondary | ICD-10-CM | POA: Diagnosis not present

## 2023-07-17 DIAGNOSIS — S7291XS Unspecified fracture of right femur, sequela: Secondary | ICD-10-CM | POA: Diagnosis not present

## 2023-07-17 DIAGNOSIS — F0280 Dementia in other diseases classified elsewhere without behavioral disturbance: Secondary | ICD-10-CM | POA: Diagnosis not present

## 2023-07-18 DIAGNOSIS — F0280 Dementia in other diseases classified elsewhere without behavioral disturbance: Secondary | ICD-10-CM | POA: Diagnosis not present

## 2023-07-18 DIAGNOSIS — S7291XS Unspecified fracture of right femur, sequela: Secondary | ICD-10-CM | POA: Diagnosis not present

## 2023-07-18 DIAGNOSIS — R2689 Other abnormalities of gait and mobility: Secondary | ICD-10-CM | POA: Diagnosis not present

## 2023-07-19 DIAGNOSIS — Z961 Presence of intraocular lens: Secondary | ICD-10-CM | POA: Diagnosis not present

## 2023-07-23 DIAGNOSIS — F0280 Dementia in other diseases classified elsewhere without behavioral disturbance: Secondary | ICD-10-CM | POA: Diagnosis not present

## 2023-07-23 DIAGNOSIS — S7291XS Unspecified fracture of right femur, sequela: Secondary | ICD-10-CM | POA: Diagnosis not present

## 2023-07-23 DIAGNOSIS — R2689 Other abnormalities of gait and mobility: Secondary | ICD-10-CM | POA: Diagnosis not present

## 2023-08-14 ENCOUNTER — Emergency Department (HOSPITAL_COMMUNITY)
Admission: EM | Admit: 2023-08-14 | Discharge: 2023-08-14 | Disposition: A | Discharge status: Home / Self Care | Attending: Emergency Medicine | Admitting: Emergency Medicine

## 2023-08-14 ENCOUNTER — Emergency Department (HOSPITAL_COMMUNITY)

## 2023-08-14 ENCOUNTER — Other Ambulatory Visit: Payer: Self-pay

## 2023-08-14 DIAGNOSIS — Z7401 Bed confinement status: Secondary | ICD-10-CM | POA: Diagnosis not present

## 2023-08-14 DIAGNOSIS — F03C0 Unspecified dementia, severe, without behavioral disturbance, psychotic disturbance, mood disturbance, and anxiety: Secondary | ICD-10-CM | POA: Diagnosis not present

## 2023-08-14 DIAGNOSIS — J449 Chronic obstructive pulmonary disease, unspecified: Secondary | ICD-10-CM | POA: Insufficient documentation

## 2023-08-14 DIAGNOSIS — R55 Syncope and collapse: Secondary | ICD-10-CM | POA: Diagnosis not present

## 2023-08-14 DIAGNOSIS — F0390 Unspecified dementia without behavioral disturbance: Secondary | ICD-10-CM | POA: Insufficient documentation

## 2023-08-14 DIAGNOSIS — R9431 Abnormal electrocardiogram [ECG] [EKG]: Secondary | ICD-10-CM | POA: Diagnosis not present

## 2023-08-14 DIAGNOSIS — N390 Urinary tract infection, site not specified: Secondary | ICD-10-CM

## 2023-08-14 DIAGNOSIS — R404 Transient alteration of awareness: Secondary | ICD-10-CM | POA: Diagnosis not present

## 2023-08-14 DIAGNOSIS — N189 Chronic kidney disease, unspecified: Secondary | ICD-10-CM | POA: Insufficient documentation

## 2023-08-14 DIAGNOSIS — R1111 Vomiting without nausea: Secondary | ICD-10-CM | POA: Diagnosis not present

## 2023-08-14 DIAGNOSIS — R4182 Altered mental status, unspecified: Secondary | ICD-10-CM | POA: Insufficient documentation

## 2023-08-14 DIAGNOSIS — I10 Essential (primary) hypertension: Secondary | ICD-10-CM | POA: Diagnosis not present

## 2023-08-14 DIAGNOSIS — R569 Unspecified convulsions: Secondary | ICD-10-CM | POA: Diagnosis not present

## 2023-08-14 DIAGNOSIS — G9389 Other specified disorders of brain: Secondary | ICD-10-CM | POA: Diagnosis not present

## 2023-08-14 DIAGNOSIS — I6782 Cerebral ischemia: Secondary | ICD-10-CM | POA: Diagnosis not present

## 2023-08-14 LAB — URINALYSIS, ROUTINE W REFLEX MICROSCOPIC
Bilirubin Urine: NEGATIVE
Glucose, UA: NEGATIVE mg/dL
Ketones, ur: 20 mg/dL — AB
Nitrite: NEGATIVE
Protein, ur: NEGATIVE mg/dL
Specific Gravity, Urine: 1.014 (ref 1.005–1.030)
pH: 7 (ref 5.0–8.0)

## 2023-08-14 LAB — COMPREHENSIVE METABOLIC PANEL
ALT: 13 U/L (ref 0–44)
AST: 24 U/L (ref 15–41)
Albumin: 3.3 g/dL — ABNORMAL LOW (ref 3.5–5.0)
Alkaline Phosphatase: 77 U/L (ref 38–126)
Anion gap: 11 (ref 5–15)
BUN: 12 mg/dL (ref 8–23)
CO2: 23 mmol/L (ref 22–32)
Calcium: 9 mg/dL (ref 8.9–10.3)
Chloride: 103 mmol/L (ref 98–111)
Creatinine, Ser: 0.97 mg/dL (ref 0.44–1.00)
GFR, Estimated: 58 mL/min — ABNORMAL LOW (ref >60)
Glucose, Bld: 111 mg/dL — ABNORMAL HIGH (ref 70–99)
Potassium: 4.2 mmol/L (ref 3.5–5.1)
Sodium: 137 mmol/L (ref 135–145)
Total Bilirubin: 0.7 mg/dL (ref 0.0–1.2)
Total Protein: 6.3 g/dL — ABNORMAL LOW (ref 6.5–8.1)

## 2023-08-14 LAB — CBC WITH DIFFERENTIAL/PLATELET
Abs Immature Granulocytes: 0.06 10*3/uL (ref 0.00–0.07)
Basophils Absolute: 0.1 10*3/uL (ref 0.0–0.1)
Basophils Relative: 1 %
Eosinophils Absolute: 0.2 10*3/uL (ref 0.0–0.5)
Eosinophils Relative: 2 %
HCT: 49.1 % — ABNORMAL HIGH (ref 36.0–46.0)
Hemoglobin: 15.8 g/dL — ABNORMAL HIGH (ref 12.0–15.0)
Immature Granulocytes: 1 %
Lymphocytes Relative: 16 %
Lymphs Abs: 1.6 10*3/uL (ref 0.7–4.0)
MCH: 28.7 pg (ref 26.0–34.0)
MCHC: 32.2 g/dL (ref 30.0–36.0)
MCV: 89.3 fL (ref 80.0–100.0)
Monocytes Absolute: 0.4 10*3/uL (ref 0.1–1.0)
Monocytes Relative: 4 %
Neutro Abs: 7.6 10*3/uL (ref 1.7–7.7)
Neutrophils Relative %: 76 %
Platelets: 160 10*3/uL (ref 150–400)
RBC: 5.5 MIL/uL — ABNORMAL HIGH (ref 3.87–5.11)
RDW: 13.9 % (ref 11.5–15.5)
WBC: 9.9 10*3/uL (ref 4.0–10.5)
nRBC: 0 % (ref 0.0–0.2)

## 2023-08-14 LAB — I-STAT CHEM 8, ED
BUN: 14 mg/dL (ref 8–23)
Calcium, Ion: 1.1 mmol/L — ABNORMAL LOW (ref 1.15–1.40)
Chloride: 106 mmol/L (ref 98–111)
Creatinine, Ser: 0.8 mg/dL (ref 0.44–1.00)
Glucose, Bld: 101 mg/dL — ABNORMAL HIGH (ref 70–99)
HCT: 48 % — ABNORMAL HIGH (ref 36.0–46.0)
Hemoglobin: 16.3 g/dL — ABNORMAL HIGH (ref 12.0–15.0)
Potassium: 4.2 mmol/L (ref 3.5–5.1)
Sodium: 139 mmol/L (ref 135–145)
TCO2: 22 mmol/L (ref 22–32)

## 2023-08-14 LAB — PROTIME-INR
INR: 1 (ref 0.8–1.2)
Prothrombin Time: 13.7 s (ref 11.4–15.2)

## 2023-08-14 LAB — MAGNESIUM: Magnesium: 2.1 mg/dL (ref 1.7–2.4)

## 2023-08-14 LAB — CBG MONITORING, ED: Glucose-Capillary: 112 mg/dL — ABNORMAL HIGH (ref 70–99)

## 2023-08-14 MED ORDER — CEPHALEXIN 250 MG PO CAPS
500.0000 mg | ORAL_CAPSULE | Freq: Once | ORAL | Status: AC
  Administered 2023-08-14: 500 mg via ORAL
  Filled 2023-08-14: qty 2

## 2023-08-14 MED ORDER — CEPHALEXIN 500 MG PO CAPS: 500.0000 mg | ORAL_CAPSULE | Freq: Four times a day (QID) | ORAL | 0 refills | Status: DC

## 2023-08-14 NOTE — ED Notes (Signed)
 Primary and Secondary RN provided pericare and changed brief.   I/O attempted but no urine

## 2023-08-14 NOTE — ED Notes (Signed)
 RN spoke to son, Sherilyn Cooter. Son states pt has no hx of seizures.

## 2023-08-14 NOTE — ED Provider Notes (Signed)
 [place] EMERGENCY DEPARTMENT AT [facility] Provider Note   CSN: [id number] Arrival date & time: 08/14/23  1404     History  Chief Complaint  Patient presents with   Seizures    Olivia Phillips is a 83 y.o. female.  83 y/o female with hx of syncope, COPD, HLD, CKD, advanced dementia presenting from [facility] after suspected seizure-like activity. Facility reports "grand mal seizure" last 4 minutes. No hx of seizure disorder. Patient appeared altered on EMS arrival, but with GCS 8 at baseline with contracted upper extremities which has been confirmed by the patient's son. Patient did have emesis x 1 en route for emesis. EMS reports transient hypotension during transport with SBP in the 90's which improved to 130/74 upon arrival w/o intervention. No reported sick contacts at the facility. No fevers or known medication changes.  CODE STATUS: DNR   Seizures      Home Medications Prior to Admission medications   Medication Sig Start Date End Date Taking? Authorizing Provider  acetaminophen (TYLENOL) 325 MG tablet Take 2 tablets (650 mg total) by mouth every 6 (six) hours as needed for fever, headache or mild pain. 02/02/20  Yes Kc, Dayna Barker, MD  Acetaminophen-DM (DELSYM COUGH + SORE THROAT PO) Take 10 mLs by mouth every 12 (twelve) hours as needed (cough).   Yes [provider]  atorvastatin (LIPITOR) 10 MG tablet Take 10 mg by mouth daily. 08/08/23  Yes [provider]  budesonide (PULMICORT) 0.5 MG/2ML nebulizer solution Take 0.5 mg by nebulization in the morning and at bedtime. 07/30/23  Yes [provider]  Cholecalciferol (VITAMIN D3) 1000 units CAPS Take 1,000 Units by mouth daily.   Yes [provider]  guaiFENesin (MUCINEX) 600 MG 12 hr tablet Take 600 mg by mouth 2 (two) times daily.   Yes [provider]  senna (SENOKOT) 8.6 MG TABS tablet Take 1 tablet (8.6 mg total) by mouth 2 (two) times daily. 02/02/20  Yes Kc, Dayna Barker,  MD  STIOLTO RESPIMAT 2.5-2.5 MCG/ACT AERS Inhale 2 puffs into the lungs daily. 08/03/23  Yes [provider]  aspirin EC 81 MG tablet Take 81 mg by mouth daily. Swallow whole.   Yes [provider]      Allergies    Codeine, Alendronate sodium, and Ibuprofen    Review of Systems   Review of Systems  Unable to perform ROS: Dementia  Neurological:  Positive for seizures.    Physical Exam Updated Vital Signs BP (!) 144/77 (BP Location: Right Arm)   Pulse 92   Resp 19   SpO2 93%   Physical Exam Vitals and nursing note reviewed.  Constitutional:      General: She is not in acute distress.    Appearance: She is well-developed. She is not diaphoretic.     Comments: Chronically ill appearing, nontoxic. Lying in bed, eyes closed. Contracted BUE.  HENT:     Head: Normocephalic and atraumatic.  Eyes:     General: No scleral icterus.    Conjunctiva/sclera: Conjunctivae normal.     Comments: Pupils 2mm, sluggish.  Cardiovascular:     Rate and Rhythm: Normal rate and regular rhythm.     Pulses: Normal pulses.  Pulmonary:     Effort: Pulmonary effort is normal. No respiratory distress.     Breath sounds: No stridor. No wheezing.     Comments: Respirations even and unlabored. Lungs CTAB. Abdominal:     Palpations: Abdomen is soft.  Musculoskeletal:  General: Normal range of motion.     Cervical back: Normal range of motion.  Skin:    General: Skin is warm and dry.     Coloration: Skin is not pale.     Findings: No erythema or rash.  Neurological:     Mental Status: She is alert.     Comments: Alert to loud voice. Unable to complete detailed neurologic assessment. No obvious facial drooping or other focal deficit.  Psychiatric:        Behavior: Behavior normal.     ED Results / Procedures / Treatments   Labs (all labs ordered are listed, but only abnormal results are displayed) Labs Reviewed  CBG MONITORING, ED - Abnormal; Notable for the following  components:      Result Value   Glucose-Capillary 112 (*)    All other components within normal limits  COMPREHENSIVE METABOLIC PANEL  CBC WITH DIFFERENTIAL/PLATELET  MAGNESIUM  PROTIME-INR  URINALYSIS, ROUTINE W REFLEX MICROSCOPIC  I-STAT CHEM 8, ED    EKG EKG Interpretation Date/Time:  Tuesday August 14 2023 14:12:23 EDT Ventricular Rate:  93 PR Interval:  154 QRS Duration:  79 QT Interval:  350 QTC Calculation: 436 R Axis:   10  Text Interpretation: Sinus rhythm Supraventricular bigeminy Borderline T wave abnormalities Confirmed by Gerhard Munch [phone]) on 08/14/2023 3:08:38 PM  Radiology No results found.  Procedures Procedures    Medications Ordered in ED Medications - No data to display  ED Course/ Medical Decision Making/ A&P                                 Medical Decision Making Amount and/or Complexity of Data Reviewed Labs: ordered. Radiology: ordered.   This patient presents to the ED for concern of altered mental status, this involves an extensive number of treatment options, and is a complaint that carries with it a high risk of complications and morbidity.  The differential diagnosis includes seizure vs syncope vs arrhythmia vs CVA vs ICH vs UTI vs CAP   Co morbidities that complicate the patient evaluation  COPD CKD Advanced dementia   Additional history obtained:  Additional history obtained from EMS personnel External records from outside source obtained and reviewed including head CT from September 2017 which was without acute findings.   Lab Tests:  I Ordered, and personally interpreted labs.  The pertinent results include:  CBG 112. Remaining labs pending.   Imaging Studies ordered:  I ordered imaging studies including CXR and CT head  Pending interpretation   Cardiac Monitoring:  The patient was maintained on a cardiac monitor.  I personally viewed and interpreted the cardiac monitored which showed an underlying rhythm of:  sinus rhythm   Medicines ordered and prescription drug management:  I have reviewed the patients home medicines and have made adjustments as needed   Problem List / ED Course:  As above. Work up initiated, largely pending at shift change. VSS.   Reevaluation:  After the interventions noted above, I reevaluated the patient and found that they have : remained stable   Social Determinants of Health:  Advanced dementia.   Dispostion:  Care signed out to [place], [place] at shift change pending work up and imaging results.          Final Clinical Impression(s) / ED Diagnoses Final diagnoses:  Altered mental status, unspecified altered mental status type    Rx / DC Orders ED Discharge Orders  None         Antony Madura, PA-C 08/14/23 1512    Gerhard Munch, MD 08/14/23 [phone]

## 2023-08-14 NOTE — ED Triage Notes (Signed)
 Pt bib ems from [facility]. Pt had 4 min grand mal seizure. Pt has history of contracting at times. Pt was fairly unresponsive upon arrival, no forced eye closure. GCS 8 but facility says this is pt baseline. Pt baseline open eyes but doesn't follow commands or speak.  Pt had an episode of emesis in route to hospital that was bile.  BP initial 90/60 >>> 130/74 HR 80 with PVC RA 94% CBG 137  20G LFA  4 mg Zofran

## 2023-08-14 NOTE — ED Provider Notes (Signed)
 Patient's care assumed by me at 3:30 PM.  Patient is pending workup for possible seizure.  Patient is reported to have had a shaking episode today.  Patient is at her mental baseline.  Labs ordered reviewed and interpreted patient has a normal white blood cell count of 9.9 hemoglobin is 16.3 cath UA shows a white blood cell count of 11-20.  Chest x-ray no acute findings CT head shows chronic ventriculomegaly. Patient reevaluated she is awake and responsive.  She does not answer questions appropriately. I will cover for urinary tract infection patient is given Keflex here urine is cultured patient is given a prescription for Keflex.  Patient has been observed for 8 hours there has been no neurologic change no sign of seizures.  Patient seems stable for discharge to care facility.   Elson Areas, PA-C 08/14/23 2219    Coral Spikes, DO 08/14/23 2352

## 2023-08-14 NOTE — ED Notes (Signed)
 RN spoke with sister Baron Sane, who states pt speaks to her and has a personality. She says she usually doesn't hug back.

## 2023-08-14 NOTE — ED Notes (Signed)
 RN attempted I/O and unsuccessful.  Pt had BM and pericare/brief change

## 2023-08-14 NOTE — Discharge Instructions (Signed)
 Return if any problems.

## 2023-08-14 NOTE — ED Notes (Signed)
 Sz pads in place, fall band, yellow socks, fall sign door open, call bell with in reach

## 2023-08-14 NOTE — ED Notes (Signed)
 CCMD Call was told they would tell [facility]

## 2023-08-16 LAB — URINE CULTURE
Culture: NO GROWTH
Special Requests: NORMAL

## 2023-09-03 DIAGNOSIS — E441 Mild protein-calorie malnutrition: Secondary | ICD-10-CM | POA: Diagnosis not present

## 2023-09-03 DIAGNOSIS — J449 Chronic obstructive pulmonary disease, unspecified: Secondary | ICD-10-CM | POA: Diagnosis not present

## 2023-09-03 DIAGNOSIS — F03C0 Unspecified dementia, severe, without behavioral disturbance, psychotic disturbance, mood disturbance, and anxiety: Secondary | ICD-10-CM | POA: Diagnosis not present

## 2023-09-03 DIAGNOSIS — R569 Unspecified convulsions: Secondary | ICD-10-CM | POA: Diagnosis not present

## 2023-09-03 DIAGNOSIS — N182 Chronic kidney disease, stage 2 (mild): Secondary | ICD-10-CM | POA: Diagnosis not present

## 2023-09-03 DIAGNOSIS — E785 Hyperlipidemia, unspecified: Secondary | ICD-10-CM | POA: Diagnosis not present

## 2023-09-13 ENCOUNTER — Emergency Department (HOSPITAL_COMMUNITY)

## 2023-09-13 ENCOUNTER — Encounter (HOSPITAL_COMMUNITY): Payer: Self-pay

## 2023-09-13 ENCOUNTER — Emergency Department (HOSPITAL_COMMUNITY)
Admission: EM | Admit: 2023-09-13 | Discharge: 2023-09-13 | Disposition: A | Discharge status: Skilled Nursing Facility | Attending: Emergency Medicine | Admitting: Emergency Medicine

## 2023-09-13 ENCOUNTER — Other Ambulatory Visit: Payer: Self-pay

## 2023-09-13 DIAGNOSIS — S0990XA Unspecified injury of head, initial encounter: Secondary | ICD-10-CM

## 2023-09-13 DIAGNOSIS — F0390 Unspecified dementia without behavioral disturbance: Secondary | ICD-10-CM | POA: Insufficient documentation

## 2023-09-13 DIAGNOSIS — W050XXA Fall from non-moving wheelchair, initial encounter: Secondary | ICD-10-CM | POA: Diagnosis not present

## 2023-09-13 DIAGNOSIS — S0181XA Laceration without foreign body of other part of head, initial encounter: Secondary | ICD-10-CM | POA: Diagnosis present

## 2023-09-13 DIAGNOSIS — Z7982 Long term (current) use of aspirin: Secondary | ICD-10-CM | POA: Insufficient documentation

## 2023-09-13 HISTORY — DX: Unspecified dementia, unspecified severity, without behavioral disturbance, psychotic disturbance, mood disturbance, and anxiety: F03.90

## 2023-09-13 NOTE — ED Notes (Signed)
 Several unsuccessful attempts at blood draw by RN and medic

## 2023-09-13 NOTE — ED Triage Notes (Signed)
 BIBA from [facility] for an unwitnessed fall out of her wheelchair, found on the floor- laceration to right forehead and C-Collar in place. Pt has hx of dementia, oriented only to self. 105 cbg

## 2023-09-13 NOTE — ED Provider Notes (Signed)
  EMERGENCY DEPARTMENT AT [facility] Provider Note   CSN: [id number] Arrival date & time: 09/13/23  1258     History  Chief Complaint  Patient presents with   Shar Paez is a 83 y.o. female.  83 year old female with history of dementia who presents after unwitnessed fall from her wheelchair.  Found on the floor a laceration to her forehead.  Patient has minimal verbal ability.  EMS called and patient placed in c-collar.  CBG was 105 and patient transported here.       Home Medications Prior to Admission medications   Medication Sig Start Date End Date Taking? Authorizing Provider  acetaminophen (TYLENOL) 325 MG tablet Take 2 tablets (650 mg total) by mouth every 6 (six) hours as needed for fever, headache or mild pain. 02/02/20   Lanae Boast, MD  Acetaminophen-DM (DELSYM COUGH + SORE THROAT PO) Take 10 mLs by mouth every 12 (twelve) hours as needed (cough).    [provider]  aspirin EC 81 MG tablet Take 81 mg by mouth daily. Swallow whole.    [provider]  atorvastatin (LIPITOR) 10 MG tablet Take 10 mg by mouth daily. 08/08/23   [provider]  budesonide (PULMICORT) 0.5 MG/2ML nebulizer solution Take 0.5 mg by nebulization in the morning and at bedtime. 07/30/23   [provider]  cephALEXin (KEFLEX) 500 MG capsule Take 1 capsule (500 mg total) by mouth 4 (four) times daily. 08/14/23   Elson Areas, PA-C  Cholecalciferol (VITAMIN D3) 1000 units CAPS Take 1,000 Units by mouth daily.    [provider]  guaiFENesin (MUCINEX) 600 MG 12 hr tablet Take 600 mg by mouth 2 (two) times daily.    [provider]  senna (SENOKOT) 8.6 MG TABS tablet Take 1 tablet (8.6 mg total) by mouth 2 (two) times daily. 02/02/20   Lanae Boast, MD  STIOLTO RESPIMAT 2.5-2.5 MCG/ACT AERS Inhale 2 puffs into the lungs daily. 08/03/23   [provider]      Allergies    Codeine, Alendronate sodium, and  Ibuprofen    Review of Systems   Review of Systems  Unable to perform ROS: Dementia    Physical Exam Updated Vital Signs BP (!) 138/54 (BP Location: Right Arm)   Pulse 64   Temp 98.2 F (36.8 C) (Oral)   Resp 18   Ht 1.524 m (5')   Wt 40.8 kg   SpO2 96%   BMI 17.58 kg/m  Physical Exam Vitals and nursing note reviewed.  Constitutional:      General: She is not in acute distress.    Appearance: Normal appearance. She is well-developed. She is not toxic-appearing.  HENT:     Head:   Eyes:     General: Lids are normal.     Conjunctiva/sclera: Conjunctivae normal.     Pupils: Pupils are equal, round, and reactive to light.  Neck:     Thyroid: No thyroid mass.     Trachea: No tracheal deviation.  Cardiovascular:     Rate and Rhythm: Normal rate and regular rhythm.     Heart sounds: Normal heart sounds. No murmur heard.    No gallop.  Pulmonary:     Effort: Pulmonary effort is normal. No respiratory distress.     Breath sounds: Normal breath sounds. No stridor. No decreased breath sounds, wheezing, rhonchi or rales.  Abdominal:     General: There is no distension.  Palpations: Abdomen is soft.     Tenderness: There is no abdominal tenderness. There is no rebound.  Musculoskeletal:        General: No tenderness. Normal range of motion.     Cervical back: Normal range of motion and neck supple.     Comments: No evidence of pain with range of motion of patient's bilateral hips or arms.  No deformities appreciated  Skin:    General: Skin is warm and dry.     Findings: No abrasion or rash.  Neurological:     Mental Status: She is alert. Mental status is at baseline. She is disoriented.     GCS: GCS eye subscore is 4. GCS verbal subscore is 5. GCS motor subscore is 6.     Cranial Nerves: No cranial nerve deficit.     Sensory: No sensory deficit.  Psychiatric:        Attention and Perception: She is inattentive.     ED Results / Procedures / Treatments   Labs (all  labs ordered are listed, but only abnormal results are displayed) Labs Reviewed  I-STAT CHEM 8, ED    EKG None  Radiology No results found.  Procedures Procedures    Medications Ordered in ED Medications - No data to display  ED Course/ Medical Decision Making/ A&P                                 Medical Decision Making Amount and/or Complexity of Data Reviewed Radiology: ordered. ECG/medicine tests: ordered.   Patient's laceration repaired with glue.  CT of head and cervical spine without acute injury.  She has been stable here.  Family at bedside and informed of results and will discharge patient back to facility   LACERATION REPAIR Performed by: Toy Baker Authorized by: Toy Baker Consent: Verbal consent obtained. Risks and benefits: risks, benefits and alternatives were discussed Consent given by: patient Patient identity confirmed: provided demographic data Prepped and Draped in normal sterile fashion Wound explored  Laceration Location: Forehead  Laceration Length: 1.5cm  No Foreign Bodies seen or palpated  Anesthesia: local infiltration    Irrigation method: syringe Amount of cleaning: standard  Skin closure: Tissue adhesive   Patient tolerance: Patient tolerated the procedure well with no immediate complications.         Final Clinical Impression(s) / ED Diagnoses Final diagnoses:  None    Rx / DC Orders ED Discharge Orders     None         Lorre Nick, MD 09/13/23 [phone]

## 2023-09-13 NOTE — ED Notes (Signed)
 [facility] notified for transport

## 2023-09-13 NOTE — ED Notes (Signed)
 Pt refused to have their temperature taken, will try again soon.

## 2023-09-22 ENCOUNTER — Emergency Department (HOSPITAL_COMMUNITY)

## 2023-09-22 ENCOUNTER — Encounter (HOSPITAL_COMMUNITY): Payer: Self-pay

## 2023-09-22 ENCOUNTER — Observation Stay (HOSPITAL_COMMUNITY)
Admission: EM | Admit: 2023-09-22 | Discharge: 2023-09-25 | Disposition: A | Discharge status: Skilled Nursing Facility | Attending: Internal Medicine | Admitting: Internal Medicine

## 2023-09-22 DIAGNOSIS — Z9889 Other specified postprocedural states: Secondary | ICD-10-CM | POA: Insufficient documentation

## 2023-09-22 DIAGNOSIS — W19XXXA Unspecified fall, initial encounter: Secondary | ICD-10-CM | POA: Diagnosis not present

## 2023-09-22 DIAGNOSIS — S72001A Fracture of unspecified part of neck of right femur, initial encounter for closed fracture: Secondary | ICD-10-CM | POA: Diagnosis not present

## 2023-09-22 DIAGNOSIS — Z7982 Long term (current) use of aspirin: Secondary | ICD-10-CM | POA: Insufficient documentation

## 2023-09-22 DIAGNOSIS — E785 Hyperlipidemia, unspecified: Secondary | ICD-10-CM | POA: Insufficient documentation

## 2023-09-22 DIAGNOSIS — F0390 Unspecified dementia without behavioral disturbance: Secondary | ICD-10-CM | POA: Diagnosis not present

## 2023-09-22 DIAGNOSIS — Z87891 Personal history of nicotine dependence: Secondary | ICD-10-CM | POA: Insufficient documentation

## 2023-09-22 DIAGNOSIS — K219 Gastro-esophageal reflux disease without esophagitis: Secondary | ICD-10-CM | POA: Insufficient documentation

## 2023-09-22 DIAGNOSIS — S72009A Fracture of unspecified part of neck of unspecified femur, initial encounter for closed fracture: Secondary | ICD-10-CM | POA: Diagnosis present

## 2023-09-22 DIAGNOSIS — M79604 Pain in right leg: Secondary | ICD-10-CM | POA: Diagnosis present

## 2023-09-22 DIAGNOSIS — S7291XA Unspecified fracture of right femur, initial encounter for closed fracture: Principal | ICD-10-CM | POA: Insufficient documentation

## 2023-09-22 DIAGNOSIS — J449 Chronic obstructive pulmonary disease, unspecified: Secondary | ICD-10-CM | POA: Diagnosis not present

## 2023-09-22 LAB — CBC WITH DIFFERENTIAL/PLATELET
Abs Immature Granulocytes: 0.02 10*3/uL (ref 0.00–0.07)
Basophils Absolute: 0.1 10*3/uL (ref 0.0–0.1)
Basophils Relative: 1 %
Eosinophils Absolute: 0.5 10*3/uL (ref 0.0–0.5)
Eosinophils Relative: 6 %
HCT: 38.5 % (ref 36.0–46.0)
Hemoglobin: 12 g/dL (ref 12.0–15.0)
Immature Granulocytes: 0 %
Lymphocytes Relative: 27 %
Lymphs Abs: 2.2 10*3/uL (ref 0.7–4.0)
MCH: 28.7 pg (ref 26.0–34.0)
MCHC: 31.2 g/dL (ref 30.0–36.0)
MCV: 92.1 fL (ref 80.0–100.0)
Monocytes Absolute: 0.8 10*3/uL (ref 0.1–1.0)
Monocytes Relative: 10 %
Neutro Abs: 4.7 10*3/uL (ref 1.7–7.7)
Neutrophils Relative %: 56 %
Platelets: 203 10*3/uL (ref 150–400)
RBC: 4.18 MIL/uL (ref 3.87–5.11)
RDW: 13.9 % (ref 11.5–15.5)
WBC: 8.3 10*3/uL (ref 4.0–10.5)
nRBC: 0 % (ref 0.0–0.2)

## 2023-09-22 LAB — CBC
HCT: 37 % (ref 36.0–46.0)
Hemoglobin: 11.9 g/dL — ABNORMAL LOW (ref 12.0–15.0)
MCH: 29.2 pg (ref 26.0–34.0)
MCHC: 32.2 g/dL (ref 30.0–36.0)
MCV: 90.7 fL (ref 80.0–100.0)
Platelets: 254 10*3/uL (ref 150–400)
RBC: 4.08 MIL/uL (ref 3.87–5.11)
RDW: 13.9 % (ref 11.5–15.5)
WBC: 8.6 10*3/uL (ref 4.0–10.5)
nRBC: 0 % (ref 0.0–0.2)

## 2023-09-22 LAB — COMPREHENSIVE METABOLIC PANEL WITH GFR
ALT: 17 U/L (ref 0–44)
AST: 23 U/L (ref 15–41)
Albumin: 2.8 g/dL — ABNORMAL LOW (ref 3.5–5.0)
Alkaline Phosphatase: 66 U/L (ref 38–126)
Anion gap: 8 (ref 5–15)
BUN: 24 mg/dL — ABNORMAL HIGH (ref 8–23)
CO2: 28 mmol/L (ref 22–32)
Calcium: 8.5 mg/dL — ABNORMAL LOW (ref 8.9–10.3)
Chloride: 105 mmol/L (ref 98–111)
Creatinine, Ser: 0.81 mg/dL (ref 0.44–1.00)
GFR, Estimated: >60 mL/min (ref >60)
Glucose, Bld: 109 mg/dL — ABNORMAL HIGH (ref 70–99)
Potassium: 4 mmol/L (ref 3.5–5.1)
Sodium: 141 mmol/L (ref 135–145)
Total Bilirubin: 0.8 mg/dL (ref 0.0–1.2)
Total Protein: 6.2 g/dL — ABNORMAL LOW (ref 6.5–8.1)

## 2023-09-22 LAB — PROTIME-INR
INR: 1.1 (ref 0.8–1.2)
Prothrombin Time: 14.5 s (ref 11.4–15.2)

## 2023-09-22 MED ORDER — VITAMIN D 25 MCG (1000 UNIT) PO TABS
1000.0000 [IU] | ORAL_TABLET | Freq: Every day | ORAL | Status: DC
  Administered 2023-09-24 – 2023-09-25 (×2): 1000 [IU] via ORAL
  Filled 2023-09-22 (×2): qty 1

## 2023-09-22 MED ORDER — ACETAMINOPHEN 325 MG PO TABS: 650.0000 mg | ORAL_TABLET | Freq: Four times a day (QID) | ORAL | Status: DC | PRN

## 2023-09-22 MED ORDER — TRAMADOL HCL 50 MG PO TABS
50.0000 mg | ORAL_TABLET | Freq: Four times a day (QID) | ORAL | Status: DC | PRN
  Administered 2023-09-24: 50 mg via ORAL
  Filled 2023-09-22: qty 1

## 2023-09-22 MED ORDER — ENOXAPARIN SODIUM 40 MG/0.4ML IJ SOSY
40.0000 mg | PREFILLED_SYRINGE | INTRAMUSCULAR | Status: DC
  Administered 2023-09-22 – 2023-09-24 (×3): 40 mg via SUBCUTANEOUS
  Filled 2023-09-22 (×3): qty 0.4

## 2023-09-22 MED ORDER — ONDANSETRON HCL 4 MG PO TABS: 4.0000 mg | ORAL_TABLET | Freq: Three times a day (TID) | ORAL | Status: DC | PRN

## 2023-09-22 MED ORDER — ARFORMOTEROL TARTRATE 15 MCG/2ML IN NEBU
15.0000 ug | INHALATION_SOLUTION | Freq: Two times a day (BID) | RESPIRATORY_TRACT | Status: DC
  Administered 2023-09-22 – 2023-09-25 (×4): 15 ug via RESPIRATORY_TRACT
  Filled 2023-09-22 (×5): qty 2

## 2023-09-22 MED ORDER — BISACODYL 5 MG PO TBEC: 5.0000 mg | DELAYED_RELEASE_TABLET | Freq: Every day | ORAL | Status: DC | PRN

## 2023-09-22 MED ORDER — ASPIRIN 81 MG PO TBEC
81.0000 mg | DELAYED_RELEASE_TABLET | Freq: Every day | ORAL | Status: DC
  Administered 2023-09-24 – 2023-09-25 (×2): 81 mg via ORAL
  Filled 2023-09-22 (×2): qty 1

## 2023-09-22 MED ORDER — SENNA 8.6 MG PO TABS
1.0000 | ORAL_TABLET | Freq: Two times a day (BID) | ORAL | Status: DC
  Administered 2023-09-24 – 2023-09-25 (×3): 8.6 mg via ORAL
  Filled 2023-09-22 (×4): qty 1

## 2023-09-22 MED ORDER — UMECLIDINIUM BROMIDE 62.5 MCG/ACT IN AEPB
1.0000 | INHALATION_SPRAY | Freq: Every day | RESPIRATORY_TRACT | Status: DC
  Administered 2023-09-24 – 2023-09-25 (×2): 1 via RESPIRATORY_TRACT
  Filled 2023-09-22: qty 7

## 2023-09-22 MED ORDER — BUDESONIDE 0.5 MG/2ML IN SUSP
0.5000 mg | Freq: Two times a day (BID) | RESPIRATORY_TRACT | Status: DC
  Administered 2023-09-22 – 2023-09-25 (×4): 0.5 mg via RESPIRATORY_TRACT
  Filled 2023-09-22 (×5): qty 2

## 2023-09-22 NOTE — ED Notes (Signed)
 RN spoke with Robley Chow, sister, to inform her pt is at [facility]

## 2023-09-22 NOTE — Plan of Care (Signed)

## 2023-09-22 NOTE — ED Notes (Signed)
 Patient transported to X-ray

## 2023-09-22 NOTE — ED Triage Notes (Addendum)
 Pt to ED via [facility] from [facility], c/o right leg pain, pt was here 9 days ago for fall .  Hx dementia. A&O X 1- Orientation at baseline.  4/18 in house xray completed at facility "subacute fracture distal femur with intramedullary rod traversing fracture site. No appreciable callus formation ands its unclear if this represents an acute new periprosthetic fracture or previous fracture for which surgery was performed"   No new injury  Last VS: 134/70, p88, 95%RA, cbg 119. RR22.   Pt given tramadol  at facility aprox ago.

## 2023-09-22 NOTE — Progress Notes (Signed)
 RN attempted to administer inhaler and patient was calm but not cooperative. She also did not cooperate with applesauce feeding therefore RN unable to administer oral medications. MD was notified

## 2023-09-22 NOTE — Plan of Care (Cosign Needed)
  Problem: Activity: Goal: Risk for activity intolerance will decrease Outcome: Progressing   Problem: Pain Managment: Goal: General experience of comfort will improve and/or be controlled Outcome: Progressing   Problem: Safety: Goal: Ability to remain free from injury will improve Outcome: Progressing   Problem: Skin Integrity: Goal: Risk for impaired skin integrity will decrease Outcome: Progressing

## 2023-09-22 NOTE — Progress Notes (Signed)
 Patient ID: Olivia Phillips, female   DOB: May 19, 1941, 83 y.o.   MRN: [id number] I was made aware of this patient by my partner who is on-call this weekend.  I was able to review her x-rays in epic.  It looks like she likely has a new distal femoral shaft fracture with previous hardware in place from a hip fracture that was fixed back in 2021.  For now, she should remain nonweightbearing on the right lower extremity.  She is graciously being admitted to the medicine service.  We will need to discuss with the family their wishes in terms of further surgical stabilization of this new fracture.  This potentially can be addressed with just some traction and distal interlocking screws through the previous rod/intramedullary nail.  I will see her likely tomorrow and then discuss her case with my trauma orthopedic colleagues versus addressing this myself on Tuesday afternoon which is my next operative day at [facility].

## 2023-09-22 NOTE — ED Notes (Signed)
 RN attempted access without success. Blood collected and IV consulted.

## 2023-09-22 NOTE — ED Provider Notes (Signed)
 Emergency Department Provider Note   I have reviewed the triage vital signs and the nursing notes.   HISTORY  Chief Complaint Leg Pain (right)   HPI Olivia Phillips is a 83 y.o. female with past history of dementia and COPD presents to the emergency department with right leg pain.  Patient is a resident at [facility] and does not recall any recent events leading to her presentation today.  She was seen in the emergency department 9 days prior with fall.  CT imaging of the head and C-spine obtained with no acute findings.  Her facility performed an x-ray yesterday showing possible subacute fracture of the distal femur and was sent here for further evaluation.   Level 5 caveat: Dementia   Past Medical History:  Diagnosis Date   Chronic low back pain    Cluster headache    COPD (chronic obstructive pulmonary disease) (HCC)    CRF (chronic renal failure)    Dementia (HCC)    GERD (gastroesophageal reflux disease)    Hyperlipidemia    Syncopal episodes    Tobacco use     Review of Systems  Level 5 caveat: Dementia  ____________________________________________   PHYSICAL EXAM:  VITAL SIGNS: ED Triage Vitals  Encounter Vitals Group     BP 09/22/23 1003 124/66     Pulse Rate 09/22/23 1003 65     Resp 09/22/23 1003 20     Temp 09/22/23 1003 97.7 F (36.5 C)     Temp Source 09/22/23 1003 Oral     SpO2 09/22/23 1003 95 %   Constitutional: Alert but confused. Well appearing and in no acute distress. Eyes: Conjunctivae are normal.  Head: Atraumatic. Nose: No congestion/rhinnorhea. Mouth/Throat: Mucous membranes are slightly dry.  Cardiovascular: Normal rate, regular rhythm. Good peripheral circulation. 2+ DP pulses bilaterally.  Respiratory: Normal respiratory effort.   Gastrointestinal: Soft and nontender. No distention.  Musculoskeletal: Edema and tenderness to the distal femur on the right without ecchymosis. Compartments are soft. No apparent knee tenderness.   Neurologic:  Normal speech and language. No gross focal neurologic deficits are appreciated.  Skin:  Skin is warm, dry and intact. No rash noted.  ____________________________________________   LABS (all labs ordered are listed, but only abnormal results are displayed)  Labs Reviewed - No data to display ____________________________________________  EKG  *** ____________________________________________  RADIOLOGY  No results found.  ____________________________________________   PROCEDURES  Procedure(s) performed:   Procedures   ____________________________________________   INITIAL IMPRESSION / ASSESSMENT AND PLAN / ED COURSE  Pertinent labs & imaging results that were available during my care of the patient were reviewed by me and considered in my medical decision making (see chart for details).   This patient is Presenting for Evaluation of leg pain, which does require a range of treatment options, and is a complaint that involves a high risk of morbidity and mortality.  The Differential Diagnoses include fracture, contusion, dislocation, sprain, DVT, etc.  Critical Interventions-    Medications - No data to display  Reassessment after intervention:    I decided to review pertinent External Data, and in summary patient with right hip intertrochanteric fracture repaired by Dr. Lucienne Ryder with intramedullary nail in 2021.   Clinical Laboratory Tests Ordered, included   Radiologic Tests Ordered, included XR right hip/femur. I independently interpreted the images and agree with radiology interpretation.   Cardiac Monitor Tracing which shows ***   Social Determinants of Health Risk patient lives in a SNF.  Consult complete with Orthopedic surgery. Patient can be admitted to medicine. They will consult. Likely surgery early next week. Ok for diet today.   Medical Decision Making: Summary:  Patient presents to the emergency department for evaluation of pain  in the right leg.  Does have some focal tenderness over the distal femur with mild swelling.  Plan to repeat x-ray.  Some concern for possible peri-prosthetic fracture on outside imaging. Leg appears neurovascularly intact. I did speak with the patient's sister, [name]. Patient is non-ambulatory at baseline.   Reevaluation with update and discussion with   ***Considered admission***  Patient's presentation is most consistent with acute presentation with potential threat to life or bodily function.   Disposition:   ____________________________________________  FINAL CLINICAL IMPRESSION(S) / ED DIAGNOSES  Final diagnoses:  None     NEW OUTPATIENT MEDICATIONS STARTED DURING THIS VISIT:  New Prescriptions   No medications on file    Note:  This document was prepared using Dragon voice recognition software and may include unintentional dictation errors.  Abby Hocking, MD, [facility]

## 2023-09-22 NOTE — H&P (Addendum)
 History and Physical    DOA: 09/22/2023  PCP: Imelda Man, MD  Patient coming from: SNF  Chief Complaint: hip pain  HPI: Olivia Phillips is a 83 y.o. female with history h/o COPD, GERD, HLP, dementia, chronic low back pain and prior right hip fracture s/p IM nailing in 2021, initially presented to the ED on 09/13/23 from skilled nursing facility for a fall evaluation.  Patient has advanced dementia and not communicative.  CT head/CT spine as part of trauma workup were unremarkable and patient was discharged back to SNF.  She was noted to have right hip discomfort at SNF and x-ray done by them suggestive of hip fracture prompting referral again to the ED.  Patient appeared to have received tramadol  1 dose at SNF. ED course: Patient comfortable, afebrile, pulse 65, respiratory rate 20, BP 124/66, O2 sat 95% on room air.  CBC unremarkable.  BMP pending.  X-ray of the hip/pelvis shows periprosthetic right hip fracture.  ED physician discussed with orthopedics Dr. Lucienne Ryder who apparently did her initial hip surgery and planning on taking her to the OR early next week.  Admission requested to hospitalist service.   Review of Systems: As per HPI, otherwise review of systems negative.    Past Medical History:  Diagnosis Date   Chronic low back pain    Cluster headache    COPD (chronic obstructive pulmonary disease) (HCC)    CRF (chronic renal failure)    Dementia (HCC)    GERD (gastroesophageal reflux disease)    Hyperlipidemia    Syncopal episodes    Tobacco use     Past Surgical History:  Procedure Laterality Date   INTRAMEDULLARY (IM) NAIL INTERTROCHANTERIC Right 01/31/2020   Procedure: INTRAMEDULLARY (IM) NAIL INTERTROCHANTRIC;  Surgeon: Arnie Lao, MD;  Location: [facility];  Service: Orthopedics;  Laterality: Right;    Social history:  reports that she has quit smoking. She has never used smokeless tobacco. She reports that she does not drink alcohol and does not use  drugs.   Allergies  Allergen Reactions   Codeine    Alendronate Sodium Nausea And Vomiting   Ibuprofen Other (See Comments)    Muscle spasms     Family History  Problem Relation Age of Onset   Alzheimer's disease Mother    Lung cancer Son       Prior to Admission medications   Medication Sig Start Date End Date Taking? Authorizing Provider  acetaminophen  (TYLENOL ) 325 MG tablet Take 2 tablets (650 mg total) by mouth every 6 (six) hours as needed for fever, headache or mild pain. 02/02/20  Yes Lesa Rape, MD  aspirin  EC 81 MG tablet Take 81 mg by mouth daily. Swallow whole.   Yes [provider]  budesonide  (PULMICORT ) 0.5 MG/2ML nebulizer solution Take 0.5 mg by nebulization in the morning and at bedtime. 07/30/23  Yes [provider]  Cholecalciferol  (VITAMIN D3) 1000 units CAPS Take 1,000 Units by mouth daily.   Yes [provider]  ondansetron  (ZOFRAN ) 4 MG tablet Take 4 mg by mouth 3 (three) times daily as needed for nausea or vomiting.   Yes [provider]  senna (SENOKOT) 8.6 MG TABS tablet Take 1 tablet (8.6 mg total) by mouth 2 (two) times daily. 02/02/20  Yes Kc, Lurlene Salon, MD  STIOLTO RESPIMAT 2.5-2.5 MCG/ACT AERS Inhale 2 puffs into the lungs daily. 08/03/23  Yes [provider]  Acetaminophen -DM (DELSYM COUGH + SORE THROAT PO) Take 10 mLs by mouth every 12 (  twelve) hours as needed (cough). Patient not taking: Reported on 09/22/2023    [provider]  atorvastatin (LIPITOR) 10 MG tablet Take 10 mg by mouth daily. Patient not taking: Reported on 09/22/2023 08/08/23   [provider]  cephALEXin  (KEFLEX ) 500 MG capsule Take 1 capsule (500 mg total) by mouth 4 (four) times daily. Patient not taking: Reported on 09/22/2023 08/14/23   Sofia, Leslie K, PA-C  guaiFENesin (MUCINEX) 600 MG 12 hr tablet Take 600 mg by mouth 2 (two) times daily. Patient not taking: Reported on 09/22/2023    [provider]  traMADol   (ULTRAM ) 50 MG tablet Take 50 mg by mouth 1 day or 1 dose. Patient not taking: Reported on 09/22/2023    [provider]    Physical Exam: Vitals:   09/22/23 1003  BP: 124/66  Pulse: 65  Resp: 20  Temp: 97.7 F (36.5 C)  TempSrc: Oral  SpO2: 95%    Constitutional: NAD, calm, comfortable, pleasantly confused Eyes: PERRL, lids and conjunctivae normal ENMT: Mucous membranes are moist. Posterior pharynx clear of any exudate or lesions.Normal dentition.  Neck: normal, supple, no masses, no thyromegaly Respiratory: clear to auscultation bilaterally, no wheezing, no crackles. Normal respiratory effort. No accessory muscle use.  Cardiovascular: Regular rate and rhythm, no murmurs / rubs / gallops. No extremity edema. 2+ pedal pulses. No carotid bruits.  Abdomen: no tenderness, no masses palpated. No hepatosplenomegaly. Bowel sounds positive.  Musculoskeletal: Has her right lower extremity extended and resistance to movement, left lower extremity flexed.  Could not examine further Neurologic: CN 2-12 grossly intact. Sensation intact, DTR normal. Strength 5/5 in all 4.  Psychiatric: Normal judgment and insight. Alert and oriented to self only. Normal mood.  SKIN/catheters: no rashes, lesions, ulcers. No induration  Labs on Admission: I have personally reviewed following labs and imaging studies  CBC: Recent Labs  Lab 09/22/23 1409  WBC 8.3  NEUTROABS 4.7  HGB 12.0  HCT 38.5  MCV 92.1  PLT 203   Basic Metabolic Panel: No results for input(s): "NA", "K", "CL", "CO2", "GLUCOSE", "BUN", "CREATININE", "CALCIUM", "MG", "PHOS" in the last 168 hours. GFR: CrCl cannot be calculated (Patient's most recent lab result is older than the maximum 21 days allowed.). Recent Labs  Lab 09/22/23 1409  WBC 8.3   Liver Function Tests: No results for input(s): "AST", "ALT", "ALKPHOS", "BILITOT", "PROT", "ALBUMIN" in the last 168 hours. No results for input(s): "LIPASE", "AMYLASE" in the  last 168 hours. No results for input(s): "AMMONIA" in the last 168 hours. Coagulation Profile: No results for input(s): "INR", "PROTIME" in the last 168 hours. Cardiac Enzymes: No results for input(s): "CKTOTAL", "CKMB", "CKMBINDEX", "TROPONINI" in the last 168 hours. BNP (last 3 results) No results for input(s): "PROBNP" in the last 8760 hours. HbA1C: No results for input(s): "HGBA1C" in the last 72 hours. CBG: No results for input(s): "GLUCAP" in the last 168 hours. Lipid Profile: No results for input(s): "CHOL", "HDL", "LDLCALC", "TRIG", "CHOLHDL", "LDLDIRECT" in the last 72 hours. Thyroid  Function Tests: No results for input(s): "TSH", "T4TOTAL", "FREET4", "T3FREE", "THYROIDAB" in the last 72 hours. Anemia Panel: No results for input(s): "VITAMINB12", "FOLATE", "FERRITIN", "TIBC", "IRON", "RETICCTPCT" in the last 72 hours. Urine analysis:    Component Value Date/Time   COLORURINE YELLOW 08/14/2023 1948   APPEARANCEUR HAZY (A) 08/14/2023 1948   LABSPEC 1.014 08/14/2023 1948   PHURINE 7.0 08/14/2023 1948   GLUCOSEU NEGATIVE 08/14/2023 1948   HGBUR SMALL (A) 08/14/2023 1948   BILIRUBINUR  NEGATIVE 08/14/2023 1948   KETONESUR 20 (A) 08/14/2023 1948   PROTEINUR NEGATIVE 08/14/2023 1948   NITRITE NEGATIVE 08/14/2023 1948   LEUKOCYTESUR SMALL (A) 08/14/2023 1948    Radiological Exams on Admission: Personally reviewed  DG Femur Min 2 Views Right Result Date: 09/22/2023 CLINICAL DATA:  Right leg pain. In ER 9 days ago for a fall. History of dementia. Outside facility 09/21/2023 radiograph reportedly report describes "subacute fracture distal femur with intramedullary rod traversing fracture site. No appreciable callus formation and it is unclear if this represents an acute new periprosthetic fracture or previous fracture for which surgery was performed." EXAM: PELVIS - 1-2 VIEW; RIGHT FEMUR 2 VIEWS COMPARISON:  Intraoperative fluoroscopy for right femoral cephalomedullary nail fixation  01/31/2020, pelvis and right hip radiographs 03/15/2020, right femur radiographs 02/16/2020 FINDINGS: There is diffuse decreased bone mineralization. Moderate bilateral sacroiliac subchondral sclerosis. The pubic symphysis joint space is maintained. Minimal bilateral superomedial femoroacetabular joint space narrowing with mild-to-moderate peripheral lateral acetabular degenerative osteophytes. There is postsurgical change again seen of a long femoral cephalomedullary nail. There is an acute fracture of the distal femoral diaphysis with mild lateral apex angulation and 2.1 cm lateral displacement/lateral cortical step-off of the distal fracture component on frontal view. Approximately 4 mm anterior fracture displacement on lateral view. IMPRESSION: Status post long cephalomedullary nail placement for remote proximal femoral intertrochanteric fracture. Acute periprosthetic fracture of the distal femoral diaphysis with mild lateral apex angulation and approximately 2.1 cm lateral and 0.4 cm anterior displacement of the distal fracture component. This is new from 02/16/2020 postoperative radiographs at the time of right femoral ORIF. Electronically Signed   By: Bertina Broccoli M.D.   On: 09/22/2023 13:02   DG Pelvis 1-2 Views Result Date: 09/22/2023 CLINICAL DATA:  Right leg pain. In ER 9 days ago for a fall. History of dementia. Outside facility 09/21/2023 radiograph reportedly report describes "subacute fracture distal femur with intramedullary rod traversing fracture site. No appreciable callus formation and it is unclear if this represents an acute new periprosthetic fracture or previous fracture for which surgery was performed." EXAM: PELVIS - 1-2 VIEW; RIGHT FEMUR 2 VIEWS COMPARISON:  Intraoperative fluoroscopy for right femoral cephalomedullary nail fixation 01/31/2020, pelvis and right hip radiographs 03/15/2020, right femur radiographs 02/16/2020 FINDINGS: There is diffuse decreased bone mineralization.  Moderate bilateral sacroiliac subchondral sclerosis. The pubic symphysis joint space is maintained. Minimal bilateral superomedial femoroacetabular joint space narrowing with mild-to-moderate peripheral lateral acetabular degenerative osteophytes. There is postsurgical change again seen of a long femoral cephalomedullary nail. There is an acute fracture of the distal femoral diaphysis with mild lateral apex angulation and 2.1 cm lateral displacement/lateral cortical step-off of the distal fracture component on frontal view. Approximately 4 mm anterior fracture displacement on lateral view. IMPRESSION: Status post long cephalomedullary nail placement for remote proximal femoral intertrochanteric fracture. Acute periprosthetic fracture of the distal femoral diaphysis with mild lateral apex angulation and approximately 2.1 cm lateral and 0.4 cm anterior displacement of the distal fracture component. This is new from 02/16/2020 postoperative radiographs at the time of right femoral ORIF. Electronically Signed   By: Bertina Broccoli M.D.   On: 09/22/2023 13:02    EKG: Independently reviewed.  EKG done 9/10 revealed low voltage but sinus rhythm, PACs     Assessment and Plan:   Principal Problem:   Hip fracture (HCC)    1.  Right periprosthetic hip fracture: Plan for ORIF early next week.  Okay to eat for now.  Hold prophylactic Lovenox  and  keep n.p.o. after midnight on Sunday if plan for intervention on Monday.  Tramadol  as needed available for pain (codeine allergy listed in chart).  Resumed aspirin  the patient has been getting at nursing facility (?  Preventative)  2.  COPD: No respiratory distress currently and saturating well on room air.  Resume home nebulizer treatments.  As needed nebulizers available as well.  Incentive spirometry will help in perioperative phase.    3.  GERD, hyperlipidemia: Not on any medications at SNF  4. Advanced dementia: Patient unable to provide history but appears to be  comfortable and does not appear to be in pain currently.  Resume home medications  5.  Goals of care: Patient presents with a  DNR/MOST form from SNF.  Per ED physician discussion with next of kin (sister)-agreed with DNR status and okay for surgeries if it helps with her quality of life.  DVT prophylaxis: Lovenox    Code Status: DNR   Consults called: Orthopedics, Dr. Lucienne Ryder Admission status :I certify that at the point of admission it is my clinical judgment that the patient will require inpatient hospital care spanning beyond 2 midnights from the point of admission due to high intensity of service and high frequency of surveillance required.Inpatient status is judged to be reasonable and necessary in order to provide the required intensity of service to ensure the patient's safety. The patient's presenting symptoms, physical exam findings, and initial radiographic and laboratory data in the context of their chronic comorbidities is felt to place them at high risk for further clinical deterioration. The following factors support the patient status of inpatient : Periprosthetic hip fracture requiring ORIF-inpatient only procedure     Michela Aguas MD [facility] Pager in [place]  If 7PM-7AM, please contact night-coverage [email or web address]   09/22/2023, 3:11 PM

## 2023-09-22 NOTE — Progress Notes (Signed)
 Orthopedic Tech Progress Note Patient Details:  Olivia Phillips [birth year] [id number]  Patient ID: Olivia Phillips, female   DOB: 1940-06-30, 83 y.o.   MRN: [id number] The pt doesn't meet criteria for ohf. Pt must be under 70 to get ohf. Terryann Fiddler 09/22/2023, 7:43 PM

## 2023-09-23 DIAGNOSIS — S7291XA Unspecified fracture of right femur, initial encounter for closed fracture: Secondary | ICD-10-CM | POA: Diagnosis not present

## 2023-09-23 NOTE — Progress Notes (Signed)
 Orthopedic Tech Progress Note Patient Details:  Olivia Phillips Mar 30, 1941 [id number]  Ortho Devices Type of Ortho Device: Knee Immobilizer Ortho Device/Splint Location: RLE Ortho Device/Splint Interventions: Application   Post Interventions Patient Tolerated: Well  Mearl Spice Olivia Phillips 09/23/2023, 2:36 PM

## 2023-09-23 NOTE — Progress Notes (Signed)
  Progress Note   Patient: Olivia Phillips ZOX:[id number] DOB: 11-02-1940 DOA: 09/22/2023     1 DOS: the patient was seen and examined on 09/23/2023   Brief hospital course: 83 y.o. female with history h/o COPD, GERD, HLP, dementia, chronic low back pain and prior right hip fracture s/p IM nailing in 2021, initially presented to the ED on 09/13/23 from skilled nursing facility for a fall evaluation.  Patient has advanced dementia and not communicative.  CT head/CT spine as part of trauma workup were unremarkable and patient was discharged back to SNF.  She was noted to have right hip discomfort at SNF and x-ray done by them suggestive of hip fracture prompting referral again to the ED.  Patient appeared to have received tramadol  1 dose at SNF. ED course: Patient comfortable, afebrile, pulse 65, respiratory rate 20, BP 124/66, O2 sat 95% on room air.  CBC unremarkable.  BMP pending.  X-ray of the hip/pelvis shows periprosthetic right hip fracture.  ED physician discussed with orthopedics Dr. Lucienne Ryder who apparently did her initial hip surgery and planning on taking her to the OR early next week.  Admission requested to hospitalist service.  Assessment and Plan: 1.  Right periprosthetic hip fracture:  -Orthopedic Surgery consulted with initial plan for possible ORIF 4/21 -Per orthopedic surgery, unsure if pt would like to pursue surgery vs symptom management as pt is mainly bed and wheelchair bound, reportedly   2.  COPD: No respiratory distress currently and saturating well on room air.  Resume home nebulizer treatments.  As needed nebulizers available as well.  Incentive spirometry will help in perioperative phase.   -No audible wheezing   3.  GERD, hyperlipidemia: Not on any medications at SNF   4. Advanced dementia:  -cont home meds -Seems stable   5.  Goals of care: Patient presents with a  DNR/MOST form from SNF.         Subjective: Pleasantly confused  Physical Exam: Vitals:    09/22/23 1605 09/22/23 2023 09/23/23 0515 09/23/23 0903  BP: 134/74 110/88 109/63   Pulse: 77 87 84 68  Resp: 17 18 20 18   Temp: 98.5 F (36.9 C) 98.4 F (36.9 C) 98 F (36.7 C)   TempSrc: Oral     SpO2: 100% 97% 97% 96%   General exam: Awake, laying in bed, in nad Respiratory system: Normal respiratory effort, no wheezing Cardiovascular system: regular rate, s1, s2 Gastrointestinal system: Soft, nondistended, positive BS Central nervous system: CN2-12 grossly intact, strength intact Extremities: Perfused, no clubbing Skin: Normal skin turgor, no notable skin lesions seen Psychiatry: Mood normal // no visual hallucinations   Data Reviewed:  There are no new results to review at this time.  Family Communication: Pt in room, family not at bedside  Disposition: Status is: Inpatient Remains inpatient appropriate because: severity of illness  Planned Discharge Destination: Skilled nursing facility and      Author: Cherylle Corwin, MD 09/23/2023 6:33 PM  For on call review [email or web address].

## 2023-09-23 NOTE — Progress Notes (Signed)
 RN attempted to feed the patient. She is alert but will not follow commands. Patient declined food and liquids

## 2023-09-23 NOTE — Progress Notes (Signed)
 Patient ID: Olivia Phillips, female   DOB: [birth year], 83 y.o.   MRN: [id number] I came by the bedside to check on Olivia Phillips.  She is alert but not oriented and does not follow commands.  She appears in no acute distress.  She is likely at her baseline of dementia.  She does have a distal shaft femur fracture on the right side with an intact intramedullary rod which is supporting the fracture somewhat.  I can lift her right leg up and she does exhibit some pain.  I will need to reach out to the family at some point today to see what her baseline function and to find out what their wishes are in terms of potential surgical care and treatment for this fracture.

## 2023-09-23 NOTE — Progress Notes (Signed)
 Patient ID: Olivia Phillips, female   DOB: [birth year], 83 y.o.   MRN: [id number] I just spoke to the patient's sister on the phone who is her POA.  She says her sister has not walked in several years now.  At the nursing care facility, she mainly is in the bed and in a wheelchair.  She prefers her sister not to go to any type of surgery unless it is needed for pain control purposes for getting her into a chair.  I will have the [employer] at least place a knee immobilizer on her right lower extremity and continue to evaluate her closely determine whether or not surgery would be needed for pain control purposes.  Her sister is going to check on her today and see how she is doing as well.  Will likely have a decision by tomorrow in terms of whether or not to proceed with surgery.

## 2023-09-23 NOTE — Hospital Course (Signed)
 83 y.o. female with history h/o COPD, GERD, HLP, dementia, chronic low back pain and prior right hip fracture s/p IM nailing in 2021, initially presented to the ED on 09/13/23 from skilled nursing facility for a fall evaluation.  Patient has advanced dementia and not communicative.  CT head/CT spine as part of trauma workup were unremarkable and patient was discharged back to SNF.  She was noted to have right hip discomfort at SNF and x-ray done by them suggestive of hip fracture prompting referral again to the ED.  Patient appeared to have received tramadol  1 dose at SNF. ED course: Patient comfortable, afebrile, pulse 65, respiratory rate 20, BP 124/66, O2 sat 95% on room air.  CBC unremarkable.  BMP pending.  X-ray of the hip/pelvis shows periprosthetic right hip fracture.  ED physician discussed with orthopedics Dr. Lucienne Ryder who apparently did her initial hip surgery and planning on taking her to the OR early next week.  Admission requested to hospitalist service.

## 2023-09-23 NOTE — Plan of Care (Signed)

## 2023-09-24 DIAGNOSIS — S7291XA Unspecified fracture of right femur, initial encounter for closed fracture: Secondary | ICD-10-CM | POA: Diagnosis not present

## 2023-09-24 LAB — COMPREHENSIVE METABOLIC PANEL WITH GFR
ALT: 17 U/L (ref 0–44)
AST: 23 U/L (ref 15–41)
Albumin: 2.9 g/dL — ABNORMAL LOW (ref 3.5–5.0)
Alkaline Phosphatase: 75 U/L (ref 38–126)
Anion gap: 15 (ref 5–15)
BUN: 25 mg/dL — ABNORMAL HIGH (ref 8–23)
CO2: 19 mmol/L — ABNORMAL LOW (ref 22–32)
Calcium: 8.7 mg/dL — ABNORMAL LOW (ref 8.9–10.3)
Chloride: 106 mmol/L (ref 98–111)
Creatinine, Ser: 0.7 mg/dL (ref 0.44–1.00)
GFR, Estimated: >60 mL/min (ref >60)
Glucose, Bld: 104 mg/dL — ABNORMAL HIGH (ref 70–99)
Potassium: 3.9 mmol/L (ref 3.5–5.1)
Sodium: 140 mmol/L (ref 135–145)
Total Bilirubin: 1.5 mg/dL — ABNORMAL HIGH (ref 0.0–1.2)
Total Protein: 6.6 g/dL (ref 6.5–8.1)

## 2023-09-24 LAB — CBC
HCT: 40 % (ref 36.0–46.0)
Hemoglobin: 12.6 g/dL (ref 12.0–15.0)
MCH: 28.4 pg (ref 26.0–34.0)
MCHC: 31.5 g/dL (ref 30.0–36.0)
MCV: 90.3 fL (ref 80.0–100.0)
Platelets: 293 10*3/uL (ref 150–400)
RBC: 4.43 MIL/uL (ref 3.87–5.11)
RDW: 13.6 % (ref 11.5–15.5)
WBC: 7.6 10*3/uL (ref 4.0–10.5)
nRBC: 0 % (ref 0.0–0.2)

## 2023-09-24 NOTE — NC FL2 (Signed)
 [place]  MEDICAID FL2 LEVEL OF CARE FORM     IDENTIFICATION  Patient Name: Olivia Phillips Birthdate: 11-14-1940 Sex: female Admission Date (Current Location): 09/22/2023  [place] and [place] Number:  Ernesto Heady [id number] P Facility and Address:  [facility], [address] [place], [place] [address]      Provider Number: [id number]  Attending Physician Name and Address:  Oral Billings, MD  Relative Name and Phone Number:  Margarette Shawl [phone]    Current Level of Care: Hospital Recommended Level of Care: Skilled Nursing Facility Prior Approval Number:    Date Approved/Denied:   PASRR Number: [id number]  Discharge Plan: SNF    Current Diagnoses: Patient Active Problem List   Diagnosis Date Noted   Hip fracture (HCC) 09/22/2023   Right femoral fracture (HCC) 01/30/2020   Leukocytosis 01/30/2020   Elevated blood pressure reading 01/30/2020   DNR (do not resuscitate) 01/30/2020   Hyperlipidemia 03/15/2016   Tobacco abuse 03/15/2016   Syncope 03/15/2016    Orientation RESPIRATION BLADDER Height & Weight      (not oriented)  Normal Incontinent Weight:   Height:     BEHAVIORAL SYMPTOMS/MOOD NEUROLOGICAL BOWEL NUTRITION STATUS      Incontinent Diet (see discharge summary)  AMBULATORY STATUS COMMUNICATION OF NEEDS Skin     Does not communicate Skin abrasions                       Personal Care Assistance Level of Assistance  Bathing, Feeding, Dressing           Functional Limitations Info  Sight, Hearing, Speech          SPECIAL CARE FACTORS FREQUENCY  PT (By licensed PT), OT (By licensed OT)     PT Frequency: 5x week OT Frequency: 5x week            Contractures Contractures Info: Not present    Additional Factors Info  Code Status, Allergies Code Status Info: DNR Allergies Info: Codeine, Alendronate Sodium, Ibuprofen           Current Medications (09/24/2023):  This is  the current hospital active medication list Current Facility-Administered Medications  Medication Dose Route Frequency Provider Last Rate Last Admin   acetaminophen  (TYLENOL ) tablet 650 mg  650 mg Oral Q6H PRN Michela Aguas, MD       arformoterol  (BROVANA ) nebulizer solution 15 mcg  15 mcg Nebulization BID Michela Aguas, MD   15 mcg at 09/24/23 3875   And   umeclidinium bromide  (INCRUSE ELLIPTA ) 62.5 MCG/ACT 1 puff  1 puff Inhalation Daily Michela Aguas, MD   1 puff at 09/24/23 0916   aspirin  EC tablet 81 mg  81 mg Oral Daily Michela Aguas, MD   81 mg at 09/24/23 1048   bisacodyl  (DULCOLAX) EC tablet 5 mg  5 mg Oral Daily PRN Michela Aguas, MD       budesonide  (PULMICORT ) nebulizer solution 0.5 mg  0.5 mg Nebulization BID Michela Aguas, MD   0.5 mg at 09/24/23 0914   cholecalciferol  (VITAMIN D3) 25 MCG (1000 UNIT) tablet 1,000 Units  1,000 Units Oral Daily Michela Aguas, MD   1,000 Units at 09/24/23 1048   enoxaparin  (LOVENOX ) injection 40 mg  40 mg Subcutaneous Q24H Michela Aguas, MD   40 mg at 09/23/23 2202   ondansetron  (ZOFRAN ) tablet 4 mg  4 mg Oral TID PRN Michela Aguas, MD       senna (SENOKOT) tablet 8.6  mg  1 tablet Oral BID Michela Aguas, MD   8.6 mg at 09/24/23 1048   traMADol  (ULTRAM ) tablet 50 mg  50 mg Oral Q6H PRN Michela Aguas, MD         Discharge Medications: Please see discharge summary for a list of discharge medications.  Relevant Imaging Results:  Relevant Lab Results:   Additional Information SSN: [id number]  Gloria Lares, Merrie Abed, LCSW

## 2023-09-24 NOTE — TOC Initial Note (Signed)
 Transition of Care [facility]) - Initial/Assessment Note    Patient Details  Name: Olivia Phillips MRN: [id number] Date of Birth: June 15, 1940  Transition of Care [facility]) CM/SW Contact:    Olivia Hals, LCSW Phone Number: 09/24/2023, 3:07 PM  Clinical Narrative:      Pt not oriented.  CSW spoke with sister Olivia Phillips, who confirmed pt is LTC resident at [facility].  Olivia Phillips does intend for pt to return there.  CSW confirmed with [place] that pt can return tomorrow and will not be evaluated by PT.             Expected Discharge Plan: Long Term Nursing Home Barriers to Discharge: Continued Medical Work up   Patient Goals and CMS Choice     Choice offered to / list presented to : Sibling (sister Paediatric nurse)      Expected Discharge Plan and Services In-house Referral: Clinical Social Work   Post Acute Care Choice: Skilled Nursing Facility Living arrangements for the past 2 months: Skilled Holiday representative (LTC at [facility])                                      Prior Living Arrangements/Services Living arrangements for the past 2 months: Skilled Holiday representative (LTC at [facility]) Lives with:: Facility Resident Patient language and need for interpreter reviewed:: No        Need for Family Participation in Patient Care: Yes (Comment) Care giver support system in place?: Yes (comment) Current home services: Other (comment) (na) Criminal Activity/Legal Involvement Pertinent to Current Situation/Hospitalization: No - Comment as needed  Activities of Daily Living      Permission Sought/Granted                  Emotional Assessment Appearance:: Appears stated age Attitude/Demeanor/Rapport: Unable to Assess Affect (typically observed): Unable to Assess Orientation: :  (not oriented)      Admission diagnosis:  Hip fracture (HCC) [S72.009A] Closed fracture of right femur, unspecified fracture morphology, unspecified portion of femur, initial encounter [facility])  [S72.91XA] Patient Active Problem List   Diagnosis Date Noted   Hip fracture (HCC) 09/22/2023   Right femoral fracture (HCC) 01/30/2020   Leukocytosis 01/30/2020   Elevated blood pressure reading 01/30/2020   DNR (do not resuscitate) 01/30/2020   Hyperlipidemia 03/15/2016   Tobacco abuse 03/15/2016   Syncope 03/15/2016   PCP:  Imelda Man, MD Pharmacy:   [id number] Jonette Nestle, [place] - [address] AT [facility] OF [facility] & [address] [place] [address] Phone: [phone] Fax: [phone]  [facility] - [place], [place] - [address] [place] [address] Phone: [phone] Fax: [phone]     Social Drivers of Health (SDOH) Social History: SDOH Screenings   Food Insecurity: Patient Unable To Answer (09/22/2023)  Housing: Patient Unable To Answer (09/22/2023)  Transportation Needs: Patient Unable To Answer (09/22/2023)  Social Connections: Unknown (09/22/2023)  Tobacco Use: Medium Risk (09/22/2023)   SDOH Interventions:     Readmission Risk Interventions     No data to display

## 2023-09-24 NOTE — TOC CAGE-AID Note (Signed)
 Transition of Care [facility]) - CAGE-AID Screening   Patient Details  Name: Olivia Phillips MRN: [id number] Date of Birth: [birth year]  Transition of Care [facility]) CM/SW Contact:    Taliah Porche E Ariday Brinker, LCSW Phone Number: 09/24/2023, 4:12 PM   Clinical Narrative:    CAGE-AID Screening: Substance Abuse Screening unable to be completed due to: : Patient unable to participate

## 2023-09-24 NOTE — Progress Notes (Signed)
 Patient ID: LATISE DILLEY, female   DOB: [birth year], 83 y.o.   MRN: [id number] I just saw the patient again at the bedside and talk to her sister as well.  When the patient is laying in bed she is comfortable.  There is a knee immobilizer in place.  She seems to be very comfortable when she is not moved around.  Her sister would rather her not have surgery and see how she does without surgery and I think this is reasonable given the fact that there is a rod in her leg.  Also given the fact that she does not walk at all, we are not worried about shortening of the fracture.  From an orthopedic standpoint, the plan and recommendations will be nonoperative treatment with bed rest and occasionally putting her in a wheelchair as comfort allows.  I can then reevaluate her in a week to 2 weeks in my office to see how she is doing overall.  She is pleasantly demented but did exhibit some pain when I moved her leg around.  Her sister feels comfortable with having her return to her own skilled nursing facility when possible which our surroundings that the patient is more familiar with.

## 2023-09-24 NOTE — Plan of Care (Signed)
  Problem: Health Behavior/Discharge Planning: Goal: Ability to manage health-related needs will improve Outcome: Progressing   Problem: Coping: Goal: Level of anxiety will decrease Outcome: Progressing   Problem: Pain Managment: Goal: General experience of comfort will improve and/or be controlled Outcome: Progressing   Problem: Safety: Goal: Ability to remain free from injury will improve Outcome: Progressing

## 2023-09-24 NOTE — Discharge Instructions (Signed)
 The patient needs to remain nonweightbearing on her right lower extremity. The knee immobilizer on her right lower extremity can provide some comfort. Please remove the knee immobilizer on a daily basis to make sure there is no skin breakdown. When the patient is placed in a wheelchair, she should have the knee immobilizer in place.

## 2023-09-24 NOTE — Progress Notes (Signed)
  Progress Note   Patient: Olivia Phillips DOB: [birth year] DOA: 09/22/2023     2 DOS: the patient was seen and examined on 09/24/2023   Brief hospital course: 83 y.o. female with history h/o COPD, GERD, HLP, dementia, chronic low back pain and prior right hip fracture s/p IM nailing in 2021, initially presented to the ED on 09/13/23 from skilled nursing facility for a fall evaluation.  Patient has advanced dementia and not communicative.  CT head/CT spine as part of trauma workup were unremarkable and patient was discharged back to SNF.  She was noted to have right hip discomfort at SNF and x-ray done by them suggestive of hip fracture prompting referral again to the ED.  Patient appeared to have received tramadol  1 dose at SNF. ED course: Patient comfortable, afebrile, pulse 65, respiratory rate 20, BP 124/66, O2 sat 95% on room air.  CBC unremarkable.  BMP pending.  X-ray of the hip/pelvis shows periprosthetic right hip fracture.  ED physician discussed with orthopedics Dr. Lucienne Ryder who apparently did her initial hip surgery and planning on taking her to the OR early next week.  Admission requested to hospitalist service.  Assessment and Plan: 1.  Right periprosthetic hip fracture:  -Orthopedic Surgery consulted with initial plan for possible ORIF 4/21 -Per orthopedic surgery, plan is now non-operative management as family would rather pt not have surgery -cont analgesia as needed -TOC following for dispo   2.  COPD: No respiratory distress currently and saturating well on room air.  Resume home nebulizer treatments.  As needed nebulizers available as well.  Incentive spirometry will help in perioperative phase.   -No audible wheezing -Breathing comfortably this AM   3.  GERD, hyperlipidemia: Not on any medications at SNF   4. Advanced dementia:  -cont home meds -Seems stable   5.  Goals of care: Patient presents with a  DNR/MOST form from SNF.         Subjective: Remains  pleasantly confused  Physical Exam: Vitals:   09/24/23 0455 09/24/23 0903 09/24/23 0917 09/24/23 1526  BP: 136/67 127/66  116/75  Pulse: 60 62 63 72  Resp: 16 20 18 18   Temp: 97.8 F (36.6 C) 97.9 F (36.6 C)  98.7 F (37.1 C)  TempSrc: Axillary Axillary  Oral  SpO2: 100% 96% 97% 97%   General exam: Conversant, in no acute distress Respiratory system: normal chest rise, clear, no audible wheezing Cardiovascular system: regular rhythm, s1-s2 Gastrointestinal system: Nondistended, nontender, pos BS Central nervous system: No seizures, no tremors Extremities: No cyanosis, no joint deformities Skin: No rashes, no pallor Psychiatry: Difficult to assess given mentation  Data Reviewed:  There are no new results to review at this time.  Family Communication: Pt in room, family not at bedside  Disposition: Status is: Inpatient Remains inpatient appropriate because: severity of illness  Planned Discharge Destination: Skilled nursing facility and      Author: Cherylle Corwin, MD 09/24/2023 5:22 PM  For on call review [email or web address].

## 2023-09-25 DIAGNOSIS — S7291XA Unspecified fracture of right femur, initial encounter for closed fracture: Secondary | ICD-10-CM | POA: Diagnosis not present

## 2023-09-25 DIAGNOSIS — S72001A Fracture of unspecified part of neck of right femur, initial encounter for closed fracture: Secondary | ICD-10-CM | POA: Diagnosis present

## 2023-09-25 MED ORDER — BISACODYL 5 MG PO TBEC: 5.0000 mg | DELAYED_RELEASE_TABLET | Freq: Every day | ORAL | Status: AC | PRN

## 2023-09-25 MED ORDER — ORAL CARE MOUTH RINSE: 15.0000 mL | OROMUCOSAL | Status: DC | PRN

## 2023-09-25 MED ORDER — ORAL CARE MOUTH RINSE
15.0000 mL | OROMUCOSAL | Status: DC
  Administered 2023-09-25: 15 mL via OROMUCOSAL

## 2023-09-25 MED ORDER — TRAMADOL HCL 50 MG PO TABS: 50.0000 mg | ORAL_TABLET | ORAL | 0 refills | Status: AC

## 2023-09-25 NOTE — Progress Notes (Signed)
 Report given to [facility] at [facility]. Patient waiting for PTAR at this time.

## 2023-09-25 NOTE — Care Management Obs Status (Signed)
 MEDICARE OBSERVATION STATUS NOTIFICATION   Patient Details  Name: ILIANNA BOWN MRN: [id number] Date of Birth: December 17, 1940   Medicare Observation Status Notification Given:  Yes    Elspeth Hals, LCSW 09/25/2023, 11:50 AM

## 2023-09-25 NOTE — TOC Transition Note (Signed)
 Transition of Care [facility]) - Discharge Note   Patient Details  Name: Olivia Phillips MRN: [id number] Date of Birth: Dec 24, 1940  Transition of Care [facility]) CM/SW Contact:  Elspeth Hals, LCSW Phone Number: 09/25/2023, 11:54 AM   Clinical Narrative:   Pt discharging to [facility]  RN call report to [phone].  PTAR called [phone].    Final next level of care: Long Term Nursing Home Barriers to Discharge: Barriers Resolved   Patient Goals and CMS Choice     Choice offered to / list presented to : Sibling (sister Olivia Phillips)      Discharge Placement              Patient chooses bed at: [facility] Patient to be transferred to facility by: ptar Name of family member notified: sister Olivia Phillips Patient and family notified of of transfer: 09/25/23  Discharge Plan and Services Additional resources added to the After Visit Summary for   In-house Referral: Clinical Social Work   Post Acute Care Choice: Skilled Nursing Facility                               Social Drivers of Health (SDOH) Interventions SDOH Screenings   Food Insecurity: Patient Unable To Answer (09/22/2023)  Housing: Patient Unable To Answer (09/22/2023)  Transportation Needs: Patient Unable To Answer (09/22/2023)  Social Connections: Unknown (09/22/2023)  Tobacco Use: Medium Risk (09/22/2023)     Readmission Risk Interventions     No data to display

## 2023-09-25 NOTE — Care Management Important Message (Signed)
 Important Message  Patient Details  Name: Olivia Phillips MRN: [id number] Date of Birth: December 31, 1940   Important Message Given:  Yes - Medicare IM     Felix Host 09/25/2023, 11:48 AM

## 2023-09-25 NOTE — Care Management CC44 (Signed)
 Condition Code 44 Documentation Completed  Patient Details  Name: Olivia Phillips MRN: [id number] Date of Birth: 01-10-1941   Condition Code 44 given:  Yes Patient signature on Condition Code 44 notice:   (patient disoriented.  Delivered by phone to sister Robley Chow) Documentation of 2 MD's agreement:  Yes Code 44 added to claim:  Yes    Elspeth Hals, LCSW 09/25/2023, 11:50 AM

## 2023-09-25 NOTE — Discharge Summary (Signed)
 Physician Discharge Summary   Patient: Olivia Phillips MRN: [id number] DOB: 12/28/1940  Admit date:     09/22/2023  Discharge date: 09/25/23  Discharge Physician: Cherylle Corwin   PCP: Imelda Man, MD   Recommendations at discharge:    Follow up with PCP in 1-2 weeks Follow up with Dr. Lucienne Ryder (Orthopedic Surgery) in 2 weeks  Discharge Diagnoses: Principal Problem:   Hip fracture [facility]) Active Problems:   Closed right hip fracture (HCC)  Resolved Problems:   * No resolved hospital problems. [facility] Course: 83 y.o. female with history h/o COPD, GERD, HLP, dementia, chronic low back pain and prior right hip fracture s/p IM nailing in 2021, initially presented to the ED on 09/13/23 from skilled nursing facility for a fall evaluation.  Patient has advanced dementia and not communicative.  CT head/CT spine as part of trauma workup were unremarkable and patient was discharged back to SNF.  She was noted to have right hip discomfort at SNF and x-ray done by them suggestive of hip fracture prompting referral again to the ED.  Patient appeared to have received tramadol  1 dose at SNF. ED course: Patient comfortable, afebrile, pulse 65, respiratory rate 20, BP 124/66, O2 sat 95% on room air.  CBC unremarkable.  BMP pending.  X-ray of the hip/pelvis shows periprosthetic right hip fracture.  ED physician discussed with orthopedics Dr. Lucienne Ryder who apparently did her initial hip surgery and planning on taking her to the OR early next week.  Admission requested to hospitalist service.  Assessment and Plan: 1.  Right periprosthetic hip fracture:  -Orthopedic Surgery consulted with initial plan for possible ORIF 4/21 -Per orthopedic surgery, plan is now non-operative management as family would rather pt not have surgery -cont analgesia as needed -Orthopedic Surgery to follow up in 2 weeks   2.  COPD: No respiratory distress currently and saturating well on room air.  Resume home nebulizer  treatments.  As needed nebulizers available as well.  Incentive spirometry will help in perioperative phase.   -No audible wheezing -Breathing comfortably this AM   3.  GERD, hyperlipidemia: Not on any medications at SNF   4. Advanced dementia:  -cont home meds -Seems stable   5.  Goals of care: Patient presents with a  DNR/MOST form from SNF.      Consultants: Orthopedic Surgery Procedures performed:   Disposition: Long term care facility Diet recommendation:  Regular diet DISCHARGE MEDICATION: Allergies as of 09/25/2023       Reactions   Codeine    Alendronate Sodium Nausea And Vomiting   Ibuprofen Other (See Comments)   Muscle spasms        Medication List     STOP taking these medications    cephALEXin  500 MG capsule Commonly known as: KEFLEX    DELSYM COUGH + SORE THROAT PO   guaiFENesin 600 MG 12 hr tablet Commonly known as: MUCINEX       TAKE these medications    acetaminophen  325 MG tablet Commonly known as: TYLENOL  Take 2 tablets (650 mg total) by mouth every 6 (six) hours as needed for fever, headache or mild pain.   aspirin  EC 81 MG tablet Take 81 mg by mouth daily. Swallow whole.   atorvastatin 10 MG tablet Commonly known as: LIPITOR Take 10 mg by mouth daily.   bisacodyl  5 MG EC tablet Commonly known as: DULCOLAX Take 1 tablet (5 mg total) by mouth daily as needed for moderate constipation.   budesonide  0.5 MG/2ML  nebulizer solution Commonly known as: PULMICORT  Take 0.5 mg by nebulization in the morning and at bedtime.   ondansetron  4 MG tablet Commonly known as: ZOFRAN  Take 4 mg by mouth 3 (three) times daily as needed for nausea or vomiting.   senna 8.6 MG Tabs tablet Commonly known as: SENOKOT Take 1 tablet (8.6 mg total) by mouth 2 (two) times daily.   Stiolto Respimat 2.5-2.5 MCG/ACT Aers Generic drug: Tiotropium Bromide-Olodaterol Inhale 2 puffs into the lungs daily.   traMADol  50 MG tablet Commonly known as:  ULTRAM  Take 1 tablet (50 mg total) by mouth 1 day or 1 dose.   Vitamin D3 1000 units Caps Take 1,000 Units by mouth daily.        Follow-up Information     Arnie Lao, MD. Schedule an appointment as soon as possible for a visit in 2 week(s).   Specialty: Orthopedic Surgery Contact information: [address] [place]  [place] [address] [phone]         Imelda Man, MD Follow up in 2 week(s).   Specialty: Internal Medicine Why: Hospital follow up Contact information: [address] [place] [address] [phone]                Discharge Exam: There were no vitals filed for this visit. General exam: Awake, laying in bed, in nad Respiratory system: Normal respiratory effort, no wheezing Cardiovascular system: regular rate, s1, s2 Gastrointestinal system: Soft, nondistended, positive BS Central nervous system: CN2-12 grossly intact, strength intact Extremities: Perfused, no clubbing Skin: Normal skin turgor, no notable skin lesions seen Psychiatry: difficult to assess given mentation  Condition at discharge: fair  The results of significant diagnostics from this hospitalization (including imaging, microbiology, ancillary and laboratory) are listed below for reference.   Imaging Studies: DG Femur Min 2 Views Right Result Date: 09/22/2023 CLINICAL DATA:  Right leg pain. In ER 9 days ago for a fall. History of dementia. Outside facility 09/21/2023 radiograph reportedly report describes "subacute fracture distal femur with intramedullary rod traversing fracture site. No appreciable callus formation and it is unclear if this represents an acute new periprosthetic fracture or previous fracture for which surgery was performed." EXAM: PELVIS - 1-2 VIEW; RIGHT FEMUR 2 VIEWS COMPARISON:  Intraoperative fluoroscopy for right femoral cephalomedullary nail fixation 01/31/2020, pelvis and right hip radiographs 03/15/2020, right femur radiographs  02/16/2020 FINDINGS: There is diffuse decreased bone mineralization. Moderate bilateral sacroiliac subchondral sclerosis. The pubic symphysis joint space is maintained. Minimal bilateral superomedial femoroacetabular joint space narrowing with mild-to-moderate peripheral lateral acetabular degenerative osteophytes. There is postsurgical change again seen of a long femoral cephalomedullary nail. There is an acute fracture of the distal femoral diaphysis with mild lateral apex angulation and 2.1 cm lateral displacement/lateral cortical step-off of the distal fracture component on frontal view. Approximately 4 mm anterior fracture displacement on lateral view. IMPRESSION: Status post long cephalomedullary nail placement for remote proximal femoral intertrochanteric fracture. Acute periprosthetic fracture of the distal femoral diaphysis with mild lateral apex angulation and approximately 2.1 cm lateral and 0.4 cm anterior displacement of the distal fracture component. This is new from 02/16/2020 postoperative radiographs at the time of right femoral ORIF. Electronically Signed   By: Bertina Broccoli M.D.   On: 09/22/2023 13:02   DG Pelvis 1-2 Views Result Date: 09/22/2023 CLINICAL DATA:  Right leg pain. In ER 9 days ago for a fall. History of dementia. Outside facility 09/21/2023 radiograph reportedly report describes "subacute fracture distal femur with intramedullary rod traversing  fracture site. No appreciable callus formation and it is unclear if this represents an acute new periprosthetic fracture or previous fracture for which surgery was performed." EXAM: PELVIS - 1-2 VIEW; RIGHT FEMUR 2 VIEWS COMPARISON:  Intraoperative fluoroscopy for right femoral cephalomedullary nail fixation 01/31/2020, pelvis and right hip radiographs 03/15/2020, right femur radiographs 02/16/2020 FINDINGS: There is diffuse decreased bone mineralization. Moderate bilateral sacroiliac subchondral sclerosis. The pubic symphysis joint space  is maintained. Minimal bilateral superomedial femoroacetabular joint space narrowing with mild-to-moderate peripheral lateral acetabular degenerative osteophytes. There is postsurgical change again seen of a long femoral cephalomedullary nail. There is an acute fracture of the distal femoral diaphysis with mild lateral apex angulation and 2.1 cm lateral displacement/lateral cortical step-off of the distal fracture component on frontal view. Approximately 4 mm anterior fracture displacement on lateral view. IMPRESSION: Status post long cephalomedullary nail placement for remote proximal femoral intertrochanteric fracture. Acute periprosthetic fracture of the distal femoral diaphysis with mild lateral apex angulation and approximately 2.1 cm lateral and 0.4 cm anterior displacement of the distal fracture component. This is new from 02/16/2020 postoperative radiographs at the time of right femoral ORIF. Electronically Signed   By: Bertina Broccoli M.D.   On: 09/22/2023 13:02   CT Head Wo Contrast Result Date: 09/13/2023 CLINICAL DATA:  Head trauma, unwitnessed fall out of wheelchair, laceration to right forehead. EXAM: CT HEAD WITHOUT CONTRAST CT CERVICAL SPINE WITHOUT CONTRAST TECHNIQUE: Multidetector CT imaging of the head and cervical spine was performed following the standard protocol without intravenous contrast. Multiplanar CT image reconstructions of the cervical spine were also generated. RADIATION DOSE REDUCTION: This exam was performed according to the departmental dose-optimization program which includes automated exposure control, adjustment of the mA and/or kV according to patient size and/or use of iterative reconstruction technique. COMPARISON:  CT head 08/14/2023. FINDINGS: CT HEAD FINDINGS Brain: No acute intracranial hemorrhage. No CT evidence of acute infarct. Nonspecific hypoattenuation in the periventricular and subcortical white matter favored to reflect chronic microvascular ischemic changes.  Generalized parenchymal volume loss. No edema, mass effect, or midline shift. The basilar cisterns are patent. Ventricles: Similar ventriculomegaly which may be related to central atrophy and ex vacuo dilatation. There is slight crowding of sulci near the vertex. Acute callosal angle noted. Vascular: Atherosclerotic calcifications of the carotid siphons. No hyperdense vessel. Skull: No acute or aggressive finding. Orbits: Orbits are symmetric. Sinuses: The visualized paranasal sinuses are clear. Other: Mastoid air cells are clear. CT CERVICAL SPINE FINDINGS Alignment: Straightening of the normal cervical lordosis. No listhesis. No facet subluxation or dislocation. Skull base and vertebrae: Diffuse osteopenia. No compression fracture or displaced fracture in the cervical spine. Limited evaluation for nondisplaced fracture due to osteopenia. No suspicious osseous lesion. Soft tissues and spinal canal: No prevertebral fluid or swelling. No visible canal hematoma. Disc levels: Intervertebral disc space narrowing at multiple levels. Prominent disc osteophyte complex at C5-6 eccentric to the right resulting in moderate spinal canal stenosis. Additional small disc bulges and disc osteophyte complexes at multiple levels. Facet arthrosis and uncovertebral hypertrophy throughout the cervical spine. Foraminal narrowing is most pronounced at C5-6. Upper chest: Emphysema. Other: None. IMPRESSION: No CT evidence of acute intracranial abnormality. No acute fracture or traumatic malalignment of the cervical spine. Similar ventriculomegaly which may reflect central predominant volume loss versus normal pressure hydrocephalus in the appropriate clinical context. Degenerative changes of the cervical spine most pronounced at C5-6 where there is moderate spinal canal stenosis. Emphysema. Electronically Signed   By: Aldine Humphreys.D.  On: 09/13/2023 16:08   CT Cervical Spine Wo Contrast Result Date: 09/13/2023 CLINICAL DATA:  Head  trauma, unwitnessed fall out of wheelchair, laceration to right forehead. EXAM: CT HEAD WITHOUT CONTRAST CT CERVICAL SPINE WITHOUT CONTRAST TECHNIQUE: Multidetector CT imaging of the head and cervical spine was performed following the standard protocol without intravenous contrast. Multiplanar CT image reconstructions of the cervical spine were also generated. RADIATION DOSE REDUCTION: This exam was performed according to the departmental dose-optimization program which includes automated exposure control, adjustment of the mA and/or kV according to patient size and/or use of iterative reconstruction technique. COMPARISON:  CT head 08/14/2023. FINDINGS: CT HEAD FINDINGS Brain: No acute intracranial hemorrhage. No CT evidence of acute infarct. Nonspecific hypoattenuation in the periventricular and subcortical white matter favored to reflect chronic microvascular ischemic changes. Generalized parenchymal volume loss. No edema, mass effect, or midline shift. The basilar cisterns are patent. Ventricles: Similar ventriculomegaly which may be related to central atrophy and ex vacuo dilatation. There is slight crowding of sulci near the vertex. Acute callosal angle noted. Vascular: Atherosclerotic calcifications of the carotid siphons. No hyperdense vessel. Skull: No acute or aggressive finding. Orbits: Orbits are symmetric. Sinuses: The visualized paranasal sinuses are clear. Other: Mastoid air cells are clear. CT CERVICAL SPINE FINDINGS Alignment: Straightening of the normal cervical lordosis. No listhesis. No facet subluxation or dislocation. Skull base and vertebrae: Diffuse osteopenia. No compression fracture or displaced fracture in the cervical spine. Limited evaluation for nondisplaced fracture due to osteopenia. No suspicious osseous lesion. Soft tissues and spinal canal: No prevertebral fluid or swelling. No visible canal hematoma. Disc levels: Intervertebral disc space narrowing at multiple levels. Prominent  disc osteophyte complex at C5-6 eccentric to the right resulting in moderate spinal canal stenosis. Additional small disc bulges and disc osteophyte complexes at multiple levels. Facet arthrosis and uncovertebral hypertrophy throughout the cervical spine. Foraminal narrowing is most pronounced at C5-6. Upper chest: Emphysema. Other: None. IMPRESSION: No CT evidence of acute intracranial abnormality. No acute fracture or traumatic malalignment of the cervical spine. Similar ventriculomegaly which may reflect central predominant volume loss versus normal pressure hydrocephalus in the appropriate clinical context. Degenerative changes of the cervical spine most pronounced at C5-6 where there is moderate spinal canal stenosis. Emphysema. Electronically Signed   By: Denny Flack M.D.   On: 09/13/2023 16:08    Microbiology: Results for orders placed or performed during the hospital encounter of 08/14/23  Urine Culture     Status: None   Collection Time: 08/14/23  9:21 PM   Specimen: Urine, Clean Catch  Result Value Ref Range Status   Specimen Description URINE, CLEAN CATCH  Final   Special Requests Normal  Final   Culture   Final    NO GROWTH Performed at [facility], [address] [place], [place] [address]    Report Status 08/16/2023 FINAL  Final    Labs: CBC: Recent Labs  Lab 09/22/23 1409 09/22/23 1520 09/24/23 0749  WBC 8.3 8.6 7.6  NEUTROABS 4.7  --   --   HGB 12.0 11.9* 12.6  HCT 38.5 37.0 40.0  MCV 92.1 90.7 90.3  PLT 203 254 293   Basic Metabolic Panel: Recent Labs  Lab 09/22/23 1520 09/24/23 0749  NA 141 140  K 4.0 3.9  CL 105 106  CO2 28 19*  GLUCOSE 109* 104*  BUN 24* 25*  CREATININE 0.81 0.70  CALCIUM 8.5* 8.7*   Liver Function Tests: Recent Labs  Lab 09/22/23 1520 09/24/23  0749  AST 23 23  ALT 17 17  ALKPHOS 66 75  BILITOT 0.8 1.5*  PROT 6.2* 6.6  ALBUMIN 2.8* 2.9*   CBG: No results for input(s): "GLUCAP" in the last 168 hours.  Discharge  time spent: less than 30 minutes.  Signed: Cherylle Corwin, MD [facility] 09/25/2023

## 2023-10-17 ENCOUNTER — Other Ambulatory Visit (INDEPENDENT_AMBULATORY_CARE_PROVIDER_SITE_OTHER): Payer: Self-pay

## 2023-10-17 ENCOUNTER — Ambulatory Visit: Admitting: Orthopaedic Surgery

## 2023-10-17 DIAGNOSIS — S72141A Displaced intertrochanteric fracture of right femur, initial encounter for closed fracture: Secondary | ICD-10-CM

## 2023-10-17 DIAGNOSIS — S72331G Displaced oblique fracture of shaft of right femur, subsequent encounter for closed fracture with delayed healing: Secondary | ICD-10-CM | POA: Diagnosis not present

## 2023-10-17 NOTE — Progress Notes (Signed)
 The patient comes in today for follow-up after having sustained a right femoral shaft fracture.  She is someone who does not ambulate and is significantly demented.  She actually had a hip fracture several years prior and the right trochanteric region and a long rod was placed in her leg.  She is now just in a knee immobilizer to treat the more recent fracture that occurred with a mechanical fall.  We elected not to place any distal interlocking screws or perform any other surgery given the level of dementia that she has and the fact that she does not even walk.  Family is with her today.  They state that she has been comfortable at the skilled nursing facility where she has lived for several years.  On exam she still does exhibit pain with motion of the right knee and femur.  X-rays of the femur showed the fracture is shortened but the rod is keeping things aligned appropriately in terms of being able to allow healing.  Will have her stop the knee immobilizer in 2 weeks.  We will see her back in 4 weeks and we will have an AP and lateral of her right distal femur only.  This will be done supine since she does not walk.

## 2023-11-19 ENCOUNTER — Ambulatory Visit (INDEPENDENT_AMBULATORY_CARE_PROVIDER_SITE_OTHER): Admitting: Orthopaedic Surgery

## 2023-11-19 ENCOUNTER — Ambulatory Visit (INDEPENDENT_AMBULATORY_CARE_PROVIDER_SITE_OTHER)

## 2023-11-19 ENCOUNTER — Encounter: Payer: Self-pay | Admitting: Orthopaedic Surgery

## 2023-11-19 DIAGNOSIS — S72331G Displaced oblique fracture of shaft of right femur, subsequent encounter for closed fracture with delayed healing: Secondary | ICD-10-CM

## 2023-11-19 NOTE — Progress Notes (Signed)
 The patient is an 83 year old female with dementia who does not ambulate.  She is 2 months into a distal third femur shaft fracture.  She already had a femoral rod in that femur from an old hip fracture.  There was no distal interlocking screws but the fact that she does not ambulate we felt it was best not to place any screws and her family agreed with this.  Again she has significant dementia and does not ambulate at all.  On exam today she lets me easily lift her right leg up and does not seem to be phased by this in terms of withdrawing for pain or even acting like she is having any significant pain.  This is the same when I rotate the femur as well.  2 views of the right femur show the fracture is healing and the hardware is intact.  From orthopedic standpoint, follow-up can be as needed since she is tolerating this well and the fact that she does not walk.  She can be assisted into a wheelchair.  Follow-up can be as needed.

## 2024-07-10 ENCOUNTER — Ambulatory Visit: Admitting: Vascular Surgery

## 2024-07-10 ENCOUNTER — Encounter: Payer: Self-pay | Admitting: Vascular Surgery

## 2024-07-10 VITALS — BP 113/75 | HR 79 | Temp 98.2°F | Resp 22 | Ht 60.0 in | Wt 90.0 lb

## 2024-07-10 DIAGNOSIS — I70235 Atherosclerosis of native arteries of right leg with ulceration of other part of foot: Secondary | ICD-10-CM

## 2024-07-10 DIAGNOSIS — I70245 Atherosclerosis of native arteries of left leg with ulceration of other part of foot: Secondary | ICD-10-CM | POA: Diagnosis not present

## 2024-07-10 NOTE — Progress Notes (Signed)
 " Office Note     CC:  Peripheral arterial disease with bilateral wounds Requesting Provider:  Clarice Nottingham, MD  HPI: Olivia Phillips is a 84 y.o. (March 08, 1941) female presenting at the request of .Clarice Nottingham, MD with chronic bilateral lower extremity wounds.  On exam, Ruchy was nonparticipatory.  She does not speak at baseline, presented in a wheelchair, and requires a Deitra to move.  She is turned in bed several times a day.  Patient has a right sided heel wound, left sided plantar wound that have been present for a number of months.  The wound care nurse at her center asked for the lesions to be evaluated to ensure that they were providing a proper care.   Past Medical History:  Diagnosis Date   Chronic low back pain    Cluster headache    COPD (chronic obstructive pulmonary disease) (HCC)    CRF (chronic renal failure)    Dementia (HCC)    GERD (gastroesophageal reflux disease)    Hyperlipidemia    Syncopal episodes    Tobacco use     Past Surgical History:  Procedure Laterality Date   INTRAMEDULLARY (IM) NAIL INTERTROCHANTERIC Right 01/31/2020   Procedure: INTRAMEDULLARY (IM) NAIL INTERTROCHANTRIC;  Surgeon: Vernetta Lonni GRADE, MD;  Location: [facility];  Service: Orthopedics;  Laterality: Right;    Social History   Socioeconomic History   Marital status: Married    Spouse name: Not on file   Number of children: Not on file   Years of education: Not on file   Highest education level: Not on file  Occupational History   Not on file  Tobacco Use   Smoking status: Former   Smokeless tobacco: Never  Substance and Sexual Activity   Alcohol use: No   Drug use: No   Sexual activity: Not on file  Other Topics Concern   Not on file  Social History Narrative   Not on file   Social Drivers of Health   Tobacco Use: Medium Risk (11/19/2023)   Patient History    Smoking Tobacco Use: Former    Smokeless Tobacco Use: Never    Passive Exposure: Not on file   Financial Resource Strain: Not on file  Food Insecurity: Patient Unable To Answer (09/22/2023)   Hunger Vital Sign    Worried About Running Out of Food in the Last Year: Patient unable to answer    Ran Out of Food in the Last Year: Patient unable to answer  Transportation Needs: Patient Unable To Answer (09/22/2023)   PRAPARE - Transportation    Lack of Transportation (Medical): Patient unable to answer    Lack of Transportation (Non-Medical): Patient unable to answer  Physical Activity: Not on file  Stress: Not on file  Social Connections: Unknown (09/22/2023)   Social Connection and Isolation Panel    Frequency of Communication with Friends and Family: Patient unable to answer    Frequency of Social Gatherings with Friends and Family: Patient unable to answer    Attends Religious Services: Not on file    Active Member of Clubs or Organizations: Patient unable to answer    Attends Banker Meetings: Not on file    Marital Status: Patient unable to answer  Intimate Partner Violence: Unknown (09/22/2023)   Humiliation, Afraid, Rape, and Kick questionnaire    Fear of Current or Ex-Partner: Patient unable to answer    Emotionally Abused: Patient unable to answer    Physically Abused: Not on file  Sexually Abused: Patient unable to answer  Depression (PHQ2-9): Not on file  Alcohol Screen: Not on file  Housing: Patient Unable To Answer (09/22/2023)   Housing Stability Vital Sign    Unable to Pay for Housing in the Last Year: Patient unable to answer    Number of Times Moved in the Last Year: Not on file    Homeless in the Last Year: Patient unable to answer  Utilities: Not on file  Health Literacy: Not on file   Family History  Problem Relation Age of Onset   Alzheimer's disease Mother    Lung cancer Son     Current Outpatient Medications  Medication Sig Dispense Refill   acetaminophen  (TYLENOL ) 325 MG tablet Take 2 tablets (650 mg total) by mouth every 6 (six) hours  as needed for fever, headache or mild pain.     aspirin  EC 81 MG tablet Take 81 mg by mouth daily. Swallow whole.     atorvastatin (LIPITOR) 10 MG tablet Take 10 mg by mouth daily. (Patient not taking: Reported on 09/22/2023)     bisacodyl  (DULCOLAX) 5 MG EC tablet Take 1 tablet (5 mg total) by mouth daily as needed for moderate constipation.     budesonide  (PULMICORT ) 0.5 MG/2ML nebulizer solution Take 0.5 mg by nebulization in the morning and at bedtime.     Cholecalciferol  (VITAMIN D3) 1000 units CAPS Take 1,000 Units by mouth daily.     ondansetron  (ZOFRAN ) 4 MG tablet Take 4 mg by mouth 3 (three) times daily as needed for nausea or vomiting.     senna (SENOKOT) 8.6 MG TABS tablet Take 1 tablet (8.6 mg total) by mouth 2 (two) times daily. 120 tablet 0   STIOLTO RESPIMAT 2.5-2.5 MCG/ACT AERS Inhale 2 puffs into the lungs daily.     traMADol  (ULTRAM ) 50 MG tablet Take 1 tablet (50 mg total) by mouth 1 day or 1 dose. 5 tablet 0   No current facility-administered medications for this visit.    Allergies[1]   REVIEW OF SYSTEMS:  [X]  denotes positive finding, [ ]  denotes negative finding Cardiac  Comments:  Chest pain or chest pressure:    Shortness of breath upon exertion:    Short of breath when lying flat:    Irregular heart rhythm:        Vascular    Pain in calf, thigh, or hip brought on by ambulation:    Pain in feet at night that wakes you up from your sleep:     Blood clot in your veins:    Leg swelling:         Pulmonary    Oxygen at home:    Productive cough:     Wheezing:         Neurologic    Sudden weakness in arms or legs:     Sudden numbness in arms or legs:     Sudden onset of difficulty speaking or slurred speech:    Temporary loss of vision in one eye:     Problems with dizziness:         Gastrointestinal    Blood in stool:     Vomited blood:         Genitourinary    Burning when urinating:     Blood in urine:        Psychiatric    Major depression:          Hematologic    Bleeding problems:    Problems with  blood clotting too easily:        Skin    Rashes or ulcers:        Constitutional    Fever or chills:      PHYSICAL EXAMINATION:  There were no vitals filed for this visit.  General:  WDWN in NAD; vital signs documented above Gait: Not observed HENT: WNL, normocephalic Pulmonary: normal non-labored breathing , without wheezing Cardiac: regular HR Abdomen: soft, NT, no masses Skin: without rashes Vascular Exam/Pulses:  Right Left  Radial 2+ (normal) 2+ (normal)                       Extremities: without ischemic changes, without Gangrene , without cellulitis; without open wounds;  Musculoskeletal: no muscle wasting or atrophy  Neurologic: A&O X 3;  No focal weakness or paresthesias are detected Psychiatric:  The pt has Normal affect.   Non-Invasive Vascular Imaging:         ASSESSMENT/PLAN: GEANETTE BUONOCORE is a 84 y.o. female presenting with chronic bilateral lower extremity wounds.  In her current state, she is not an operative candidate.  Recommend continued wound care, dry dressings, elevation with no compression.  Should wounds become infected, recommend palliative care discussions.  The only surgery I would offer would be above-knee amputation   Fonda FORBES Rim, MD [facility] [phone]     [1]  Allergies Allergen Reactions   Codeine    Alendronate Sodium Nausea And Vomiting   Ibuprofen Other (See Comments)    Muscle spasms    "

## 2024-07-24 ENCOUNTER — Encounter: Admitting: Vascular Surgery
